# Patient Record
Sex: Female | Born: 1975 | Race: White | Hispanic: No | Marital: Married | Smoking: Former smoker
Health system: Southern US, Community
[De-identification: ages and names within clinical notes are randomized; demographics above are authoritative.]

## PROBLEM LIST (undated history)

## (undated) DIAGNOSIS — T8859XA Other complications of anesthesia, initial encounter: Secondary | ICD-10-CM

## (undated) DIAGNOSIS — R112 Nausea with vomiting, unspecified: Secondary | ICD-10-CM

## (undated) DIAGNOSIS — G709 Myoneural disorder, unspecified: Secondary | ICD-10-CM

## (undated) DIAGNOSIS — M436 Torticollis: Secondary | ICD-10-CM

## (undated) DIAGNOSIS — Z8719 Personal history of other diseases of the digestive system: Secondary | ICD-10-CM

## (undated) DIAGNOSIS — T4145XA Adverse effect of unspecified anesthetic, initial encounter: Secondary | ICD-10-CM

## (undated) DIAGNOSIS — Z9889 Other specified postprocedural states: Secondary | ICD-10-CM

## (undated) DIAGNOSIS — R519 Headache, unspecified: Secondary | ICD-10-CM

## (undated) DIAGNOSIS — E079 Disorder of thyroid, unspecified: Secondary | ICD-10-CM

## (undated) DIAGNOSIS — R51 Headache: Secondary | ICD-10-CM

## (undated) DIAGNOSIS — F419 Anxiety disorder, unspecified: Secondary | ICD-10-CM

## (undated) DIAGNOSIS — I10 Essential (primary) hypertension: Secondary | ICD-10-CM

## (undated) DIAGNOSIS — K219 Gastro-esophageal reflux disease without esophagitis: Secondary | ICD-10-CM

## (undated) DIAGNOSIS — T884XXA Failed or difficult intubation, initial encounter: Secondary | ICD-10-CM

## (undated) DIAGNOSIS — R011 Cardiac murmur, unspecified: Secondary | ICD-10-CM

## (undated) HISTORY — DX: Cardiac murmur, unspecified: R01.1

## (undated) HISTORY — DX: Anxiety disorder, unspecified: F41.9

## (undated) HISTORY — PX: OOPHORECTOMY: SHX86

## (undated) HISTORY — PX: TUBAL LIGATION: SHX77

## (undated) HISTORY — PX: NECK SURGERY: SHX720

## (undated) HISTORY — PX: SPINE SURGERY: SHX786

## (undated) HISTORY — PX: THYROIDECTOMY: SHX17

## (undated) HISTORY — DX: Disorder of thyroid, unspecified: E07.9

## (undated) HISTORY — PX: ABDOMINAL HYSTERECTOMY: SHX81

## (undated) HISTORY — DX: Myoneural disorder, unspecified: G70.9

## (undated) HISTORY — DX: Gastro-esophageal reflux disease without esophagitis: K21.9

---

## 1997-11-29 ENCOUNTER — Emergency Department (HOSPITAL_COMMUNITY): Admission: EM | Admit: 1997-11-29 | Discharge: 1997-11-30 | Payer: Self-pay | Admitting: Emergency Medicine

## 1999-08-04 ENCOUNTER — Emergency Department (HOSPITAL_COMMUNITY): Admission: EM | Admit: 1999-08-04 | Discharge: 1999-08-04 | Payer: Self-pay | Admitting: Emergency Medicine

## 1999-11-26 ENCOUNTER — Emergency Department (HOSPITAL_COMMUNITY): Admission: EM | Admit: 1999-11-26 | Discharge: 1999-11-26 | Payer: Self-pay | Admitting: Emergency Medicine

## 1999-11-26 ENCOUNTER — Encounter: Payer: Self-pay | Admitting: Emergency Medicine

## 2000-01-10 ENCOUNTER — Emergency Department (HOSPITAL_COMMUNITY): Admission: EM | Admit: 2000-01-10 | Discharge: 2000-01-10 | Payer: Self-pay | Admitting: Emergency Medicine

## 2000-01-28 ENCOUNTER — Other Ambulatory Visit: Admission: RE | Admit: 2000-01-28 | Discharge: 2000-01-28 | Payer: Self-pay | Admitting: Obstetrics and Gynecology

## 2016-10-15 ENCOUNTER — Emergency Department: Payer: BLUE CROSS/BLUE SHIELD

## 2016-10-15 ENCOUNTER — Emergency Department
Admission: EM | Admit: 2016-10-15 | Discharge: 2016-10-15 | Disposition: A | Discharge status: Home / Self Care | Payer: BLUE CROSS/BLUE SHIELD | Attending: Emergency Medicine | Admitting: Emergency Medicine

## 2016-10-15 ENCOUNTER — Encounter: Payer: Self-pay | Admitting: Emergency Medicine

## 2016-10-15 DIAGNOSIS — Z87891 Personal history of nicotine dependence: Secondary | ICD-10-CM | POA: Insufficient documentation

## 2016-10-15 DIAGNOSIS — S161XXA Strain of muscle, fascia and tendon at neck level, initial encounter: Secondary | ICD-10-CM | POA: Diagnosis not present

## 2016-10-15 DIAGNOSIS — M542 Cervicalgia: Secondary | ICD-10-CM | POA: Diagnosis not present

## 2016-10-15 DIAGNOSIS — I10 Essential (primary) hypertension: Secondary | ICD-10-CM | POA: Diagnosis not present

## 2016-10-15 DIAGNOSIS — Y939 Activity, unspecified: Secondary | ICD-10-CM | POA: Insufficient documentation

## 2016-10-15 DIAGNOSIS — S199XXA Unspecified injury of neck, initial encounter: Secondary | ICD-10-CM | POA: Diagnosis present

## 2016-10-15 DIAGNOSIS — R21 Rash and other nonspecific skin eruption: Secondary | ICD-10-CM | POA: Diagnosis not present

## 2016-10-15 DIAGNOSIS — Y929 Unspecified place or not applicable: Secondary | ICD-10-CM | POA: Diagnosis not present

## 2016-10-15 DIAGNOSIS — X509XXA Other and unspecified overexertion or strenuous movements or postures, initial encounter: Secondary | ICD-10-CM | POA: Diagnosis not present

## 2016-10-15 DIAGNOSIS — Y999 Unspecified external cause status: Secondary | ICD-10-CM | POA: Insufficient documentation

## 2016-10-15 MED ORDER — CYCLOBENZAPRINE HCL 5 MG PO TABS: 5.0000 mg | ORAL_TABLET | Freq: Three times a day (TID) | ORAL | 0 refills | Status: AC | PRN

## 2016-10-15 MED ORDER — KETOROLAC TROMETHAMINE 30 MG/ML IJ SOLN
30.0000 mg | Freq: Once | INTRAMUSCULAR | Status: AC
  Administered 2016-10-15: 30 mg via INTRAVENOUS
  Filled 2016-10-15: qty 1

## 2016-10-15 MED ORDER — METHYLPREDNISOLONE SODIUM SUCC 125 MG IJ SOLR
125.0000 mg | Freq: Once | INTRAMUSCULAR | Status: AC
  Administered 2016-10-15: 125 mg via INTRAMUSCULAR

## 2016-10-15 MED ORDER — METHYLPREDNISOLONE SODIUM SUCC 125 MG IJ SOLR
125.0000 mg | Freq: Once | INTRAMUSCULAR | Status: DC
  Filled 2016-10-15: qty 2

## 2016-10-15 MED ORDER — IBUPROFEN 800 MG PO TABS: 800.0000 mg | ORAL_TABLET | Freq: Three times a day (TID) | ORAL | 0 refills | Status: DC | PRN

## 2016-10-15 MED ORDER — PREDNISONE 10 MG PO TABS: ORAL_TABLET | ORAL | 0 refills | Status: DC

## 2016-10-15 NOTE — ED Triage Notes (Addendum)
Pt c/o neck pain to left side.  Heard a pop a couple weeks ago.  Ambulatory to triage.  Worse pain when turning and walking.  Has had neck surgery before.  NAD.  Moving both extremities

## 2016-10-15 NOTE — ED Notes (Signed)
2 weeks ago moved head and felt a pop on left posterior head/neck area.  Since has had headache that is somewhat relived by placing pressure on that spot.  Also she has a rash on legs that came up a few days a go.  Only itches when she is wearing pants.  No fever.

## 2016-10-15 NOTE — ED Notes (Signed)
Returned from xray

## 2016-10-15 NOTE — ED Provider Notes (Signed)
[facility] Emergency Department Provider Note  ____________________________________________  Time seen: Approximately 9:07 AM  I have reviewed the triage vital signs and the nursing notes.   HISTORY  Chief Complaint Neck Injury    HPI Morgan [name] is a 41 y.o. female that presents to the emergency department with right sided neck pain. Patient discribes the pain as a throbbing pressure on the back right of her head. Several weeks ago, patient heard a pop in her neck while having sexual relations and has had pain in her neck since. Movement makes the pain worse. Applying pressure and ice to the area is helpful. She has tried goodypowders and ibuprofen without relief. She feels like this is nerve pain. She has a plate and screws in her neck from her previous surgery. She had decreased motion after her neck surgery, and this has not changed. No tick bites. She also has a rash on her lower leg for 1 week that started after shaving and going into the swimming pool. No fever, SOB, CP, nausea, vomiting, diarrhea, numbness, tingling.  History reviewed. No pertinent past medical history.  There are no active problems to display for this patient.   Past Surgical History:  Procedure Laterality Date  . ABDOMINAL HYSTERECTOMY    . NECK SURGERY    . THYROIDECTOMY      Prior to Admission medications   Medication Sig Start Date End Date Taking? Authorizing Provider  cyclobenzaprine (FLEXERIL) 5 MG tablet Take 1 tablet (5 mg total) by mouth 3 (three) times daily as needed for muscle spasms. 10/15/16 10/22/16  Enid Derry, PA-C  ibuprofen (ADVIL,MOTRIN) 800 MG tablet Take 1 tablet (800 mg total) by mouth every 8 (eight) hours as needed. 10/15/16   Enid Derry, PA-C  predniSONE (DELTASONE) 10 MG tablet Take 6 tablets on day 1, take 5 tablets on day 2, take 4 tablets on day 3, take 3 tablets on day 4, take 2 tablets on day 5, take 1 tablet on day 6 10/15/16   Enid Derry,  PA-C    Allergies Lexapro [escitalopram oxalate] and Topamax [topiramate]  History reviewed. No pertinent family history.  Social History Social History  Substance Use Topics  . Smoking status: Former Games developer  . Smokeless tobacco: Never Used     Comment: quit today  . Alcohol use Yes     Review of Systems  Constitutional: No fever/chills Cardiovascular: No chest pain. Respiratory: No SOB. Gastrointestinal: No abdominal pain.  No nausea, no vomiting.  Musculoskeletal: Positive for neck pain.  Skin: Negative for abrasions, lacerations, ecchymosis. Positive for rash.   Neurological: Negative for numbness or tingling   ____________________________________________   PHYSICAL EXAM:  VITAL SIGNS: ED Triage Vitals  Enc Vitals Group     BP 10/15/16 0757 (!) 179/95     Pulse Rate 10/15/16 0757 100     Resp 10/15/16 0757 18     Temp 10/15/16 0757 98.7 F (37.1 C)     Temp Source 10/15/16 0757 Oral     SpO2 10/15/16 0757 100 %     Weight 10/15/16 0756 150 lb (68 kg)     Height 10/15/16 0756 5\' 6"  (1.676 m)     Head Circumference --      Peak Flow --      Pain Score 10/15/16 0755 6     Pain Loc --      Pain Edu? --      Excl. in GC? --  Constitutional: Alert and oriented. Well appearing and in no acute distress. Eyes: Conjunctivae are normal. PERRL. EOMI. Head: Atraumatic. ENT:      Ears:      Nose: No congestion/rhinnorhea.      Mouth/Throat: Mucous membranes are moist.  Neck: No stridor.  No cervical spine tenderness to palpation. Tenderness to palpation over right trapezius muscle at the base of her head. Pain relieved with deep palpation of right trapezius muscle. Cardiovascular: Normal rate, regular rhythm.  Good peripheral circulation. Respiratory: Normal respiratory effort without tachypnea or retractions. Lungs CTAB. Good air entry to the bases with no decreased or absent breath sounds. Musculoskeletal: Full range of motion to all extremities. No gross  deformities appreciated. Neurologic:  Normal speech and language. No gross focal neurologic deficits are appreciated.  Skin:  Skin is warm, dry and intact. Scattered 1 mm macules and papules over lower bilateral legs. No drainage. Nontender to palpation.   ____________________________________________   LABS (all labs ordered are listed, but only abnormal results are displayed)  Labs Reviewed - No data to display ____________________________________________  EKG   ____________________________________________  RADIOLOGY  No results found.  ____________________________________________    PROCEDURES  Procedure(s) performed:    Procedures    Medications  ketorolac (TORADOL) 30 MG/ML injection 30 mg (30 mg Intravenous Given 10/15/16 1022)  methylPREDNISolone sodium succinate (SOLU-MEDROL) 125 mg/2 mL injection 125 mg (125 mg Intramuscular Given 10/15/16 1030)     ____________________________________________   INITIAL IMPRESSION / ASSESSMENT AND PLAN / ED COURSE  Pertinent labs & imaging results that were available during my care of the patient were reviewed by me and considered in my medical decision making (see chart for details).  Review of the [place] CSRS was performed in accordance of the [facility] prior to dispensing any controlled drugs.   Patient presented to the emergency department with neck pain after injury a couple weeks ago and rash to lower leg after shaving and swimming in a hot tub. Vital signs and exam are reassuring. No indication of acute abnormalities on cervical x-ray. Patient initially presented with high blood pressure but blood pressure recheck was reassuring. Education about high blood pressure was provided. Patient agreed to follow up with primary care provider for evaluation. Patient was given a Medrol and Toradol in ED. Patient will be discharged home with prescriptions for prednisone, Flexeril, ibuprofen. Patient is to follow up with PCP as directed.  Patient is given ED precautions to return to the ED for any worsening or new symptoms.     ____________________________________________  FINAL CLINICAL IMPRESSION(S) / ED DIAGNOSES  Final diagnoses:  Strain of neck muscle, initial encounter  Rash  Hypertension, unspecified type      NEW MEDICATIONS STARTED DURING THIS VISIT:  Discharge Medication List as of 10/15/2016 10:40 AM    START taking these medications   Details  cyclobenzaprine (FLEXERIL) 5 MG tablet Take 1 tablet (5 mg total) by mouth 3 (three) times daily as needed for muscle spasms., Starting Thu 10/15/2016, Until Thu 10/22/2016, Print    ibuprofen (ADVIL,MOTRIN) 800 MG tablet Take 1 tablet (800 mg total) by mouth every 8 (eight) hours as needed., Starting Thu 10/15/2016, Print    predniSONE (DELTASONE) 10 MG tablet Take 6 tablets on day 1, take 5 tablets on day 2, take 4 tablets on day 3, take 3 tablets on day 4, take 2 tablets on day 5, take 1 tablet on day 6, Print  This chart was dictated using voice recognition software/Dragon. Despite best efforts to proofread, errors can occur which can change the meaning. Any change was purely unintentional.    Enid Derry, PA-C 10/16/16 1130    Don Perking, [place], MD 10/16/16 [phone]

## 2016-10-29 ENCOUNTER — Encounter: Payer: Self-pay | Admitting: Internal Medicine

## 2016-10-29 ENCOUNTER — Ambulatory Visit (INDEPENDENT_AMBULATORY_CARE_PROVIDER_SITE_OTHER): Payer: BLUE CROSS/BLUE SHIELD | Admitting: Internal Medicine

## 2016-10-29 VITALS — BP 160/104 | HR 109 | Temp 98.5°F | Ht 65.5 in | Wt 166.0 lb

## 2016-10-29 DIAGNOSIS — I10 Essential (primary) hypertension: Secondary | ICD-10-CM | POA: Diagnosis not present

## 2016-10-29 DIAGNOSIS — S161XXD Strain of muscle, fascia and tendon at neck level, subsequent encounter: Secondary | ICD-10-CM | POA: Diagnosis not present

## 2016-10-29 DIAGNOSIS — K219 Gastro-esophageal reflux disease without esophagitis: Secondary | ICD-10-CM | POA: Diagnosis not present

## 2016-10-29 MED ORDER — BENAZEPRIL-HYDROCHLOROTHIAZIDE 10-12.5 MG PO TABS: 1.0000 | ORAL_TABLET | Freq: Every day | ORAL | 0 refills | Status: DC

## 2016-10-29 MED ORDER — OMEPRAZOLE 20 MG PO CPDR: 20.0000 mg | DELAYED_RELEASE_CAPSULE | Freq: Every day | ORAL | 2 refills | Status: DC

## 2016-10-29 MED ORDER — CYCLOBENZAPRINE HCL 5 MG PO TABS: 5.0000 mg | ORAL_TABLET | Freq: Two times a day (BID) | ORAL | 0 refills | Status: DC | PRN

## 2016-10-29 NOTE — Patient Instructions (Signed)

## 2016-10-29 NOTE — Assessment & Plan Note (Signed)
eRx for Prilosec 20 mg daily Try to avoid foods that improve your reflux.

## 2016-10-29 NOTE — Progress Notes (Signed)
HPI  Pt presents to the clinic today to establish care and for management of the conditions listed below. She is transferring care from [facility].   GERD: Triggered by everything she eats. She had an upper GI many years ago. She takes Zantac OTC with minimal relief.   Elevated Blood Pressure: [facility]. The PA felt like this was pain related. Her BP improved after Toradol and Depo Medrol injections. Her BP today is 160/104. She has never been treated for HTN in the past.  Right Side Neck Pain: She reports about 1 month ago, she sustained a neck injury during sexual intercourse. She went to UC6[facility]28. Xray of cervical spine was negative. She was given Toradol and Depo IM. She was prescribed Prednisone, Flexeril and Ibuprofen. She has had previous anterior fusion os C 5-7. She reports the pain is better but not gone. She would like Flexeril refilled today.  Flu: never Tetanus: < 5 years ago Pap Smear: partial hysterctomy 2012, [facility] Dentist: biannually  Past Medical History:  Diagnosis Date  . GERD (gastroesophageal reflux disease)     Current Outpatient Prescriptions  Medication Sig Dispense Refill  . Aspirin-Salicylamide-Caffeine (BC HEADACHE POWDER PO) Take by mouth.    . cyclobenzaprine (FLEXERIL) 5 MG tablet Take 5 mg by mouth as needed for muscle spasms.    Marland Kitchen. ibuprofen (ADVIL,MOTRIN) 800 MG tablet Take 1 tablet (800 mg total) by mouth every 8 (eight) hours as needed. 30 tablet 0   No current facility-administered medications for this visit.     Allergies  Allergen Reactions  . Lexapro [Escitalopram Oxalate] Other (See Comments)    Loss taste  . Metoprolol Other (See Comments)    Lowers heart rate  . Topamax [Topiramate]     Loss taste    Family History  Problem Relation Age of Onset  . Arthritis Mother   . Arthritis Father   . Stroke Father   . Hypertension Father   . Heart disease Maternal Uncle   . Arthritis  Maternal Grandmother   . Heart disease Maternal Grandmother   . Hypertension Maternal Grandmother   . Arthritis Maternal Grandfather   . Lung cancer Maternal Grandfather   . Heart disease Maternal Grandfather   . Arthritis Paternal Grandmother   . Heart disease Paternal Grandmother   . Hypertension Paternal Grandmother   . Arthritis Paternal Grandfather   . Heart disease Paternal Grandfather   . Stroke Paternal Grandfather     Social History   Social History  . Marital status: Married    Spouse name: N/A  . Number of children: N/A  . Years of education: N/A   Occupational History  . Not on file.   Social History Main Topics  . Smoking status: Former Games developermoker  . Smokeless tobacco: Never Used     Comment: quit today  . Alcohol use Yes     Comment: occasional  . Drug use: No  . Sexual activity: Not on file   Other Topics Concern  . Not on file   Social History Narrative  . No narrative on file    ROS:  Constitutional: Denies fever, malaise, fatigue, headache or abrupt weight changes.  HEENT: Denies eye pain, eye redness, ear pain, ringing in the ears, wax buildup, runny nose, nasal congestion, bloody nose, or sore throat. Respiratory: Denies difficulty breathing, shortness of breath, cough or sputum production.   Cardiovascular: Denies chest pain, chest tightness, palpitations or swelling in  the hands or feet.  Gastrointestinal: Pt reports reflux. Denies abdominal pain, bloating, constipation, diarrhea or blood in the stool.  GU: Denies frequency, urgency, pain with urination, blood in urine, odor or discharge. Musculoskeletal: Pt reports neck pain. Denies decrease in range of motion, difficulty with gait, muscle pain or joint pain and swelling.  Skin: Denies redness, rashes, lesions or ulcercations.  Neurological: Denies dizziness, difficulty with memory, difficulty with speech or problems with balance and coordination.  Psych: Denies anxiety, depression, SI/HI.  No  other specific complaints in a complete review of systems (except as listed in HPI above).  PE:  BP (!) 160/104 (BP Location: Right Arm, Patient Position: Sitting, Cuff Size: Normal)   Pulse (!) 109   Temp 98.5 F (36.9 C) (Oral)   Ht 5' 5.5" (1.664 m)   Wt 166 lb (75.3 kg)   SpO2 98%   BMI 27.20 kg/m   Wt Readings from Last 3 Encounters:  10/29/16 166 lb (75.3 kg)  10/15/16 150 lb (68 kg)    General: Appears her stated age, well developed, well nourished in NAD.  Skin: Dry and intact. Cardiovascular: Normal rate and rhythm. S1,S2 noted.  No murmur, rubs or gallops noted. No JVD or BLE edema.  Pulmonary/Chest: Normal effort and positive vesicular breath sounds. No respiratory distress. No wheezes, rales or ronchi noted.  Abdomen: Soft and nontender. Normal bowel sounds. No distention or masses noted. Musculoskeletal: Normal extension of the cervical spine. Pain with flexion and rotation. No pain with palpation over the bones. Pain with palpation of the right side paracervical muscles.  Neurological: Alert and oriented. Cranial nerves II-XII grossly intact. Coordination normal.  Psychiatric: Mood and affect normal. Behavior is normal. Judgment and thought content normal.     Assessment and Plan:  Muscle Strain of Neck:  Continue Ibuprofen prn Flexeril refilled today Heat may help If worse, consider PT vs MRI  RTC in 3 weeks for follow up HTN Morgan Pilkenton, NP

## 2016-10-29 NOTE — Assessment & Plan Note (Signed)
Start Benazapril HCT  RTC in 3 weeks for follow up HTN

## 2016-11-23 ENCOUNTER — Ambulatory Visit (INDEPENDENT_AMBULATORY_CARE_PROVIDER_SITE_OTHER): Payer: BLUE CROSS/BLUE SHIELD | Admitting: Internal Medicine

## 2016-11-23 ENCOUNTER — Encounter: Payer: Self-pay | Admitting: Internal Medicine

## 2016-11-23 VITALS — BP 120/84 | HR 103 | Temp 98.1°F | Wt 166.5 lb

## 2016-11-23 DIAGNOSIS — I10 Essential (primary) hypertension: Secondary | ICD-10-CM

## 2016-11-23 LAB — TSH: TSH: 1.4 mIU/L

## 2016-11-23 MED ORDER — OMEPRAZOLE 20 MG PO CPDR: 20.0000 mg | DELAYED_RELEASE_CAPSULE | Freq: Every day | ORAL | 3 refills | Status: DC

## 2016-11-23 MED ORDER — OMEPRAZOLE 20 MG PO CPDR: 20.0000 mg | DELAYED_RELEASE_CAPSULE | Freq: Every day | ORAL | 0 refills | Status: DC

## 2016-11-23 MED ORDER — BENAZEPRIL-HYDROCHLOROTHIAZIDE 10-12.5 MG PO TABS: 1.0000 | ORAL_TABLET | Freq: Every day | ORAL | 0 refills | Status: DC

## 2016-11-23 MED ORDER — BENAZEPRIL-HYDROCHLOROTHIAZIDE 10-12.5 MG PO TABS: 1.0000 | ORAL_TABLET | Freq: Every day | ORAL | 3 refills | Status: DC

## 2016-11-23 NOTE — Assessment & Plan Note (Signed)
At goal Benazepril HCT refilled today BMET today Consider low dose beta blocker if she continues to remain tachycardic

## 2016-11-23 NOTE — Progress Notes (Signed)
Subjective:    Patient ID: Morgan [name], female    DOB: 06/29/1975, [age] y.o.   MRN: [id number]  HPI  Pt presents to the clinic today for 3 week follow up of HTN. At her last visit, she was started on Benazepril HCT. She has been taking the medication as prescribed. She denies adverse side effects. Her BP today is 120/84.  Review of Systems      Past Medical History:  Diagnosis Date  . GERD (gastroesophageal reflux disease)     Current Outpatient Prescriptions  Medication Sig Dispense Refill  . Aspirin-Salicylamide-Caffeine (BC HEADACHE POWDER PO) Take by mouth.    . benazepril-hydrochlorthiazide (LOTENSIN HCT) 10-12.5 MG tablet Take 1 tablet by mouth daily. 30 tablet 0  . cyclobenzaprine (FLEXERIL) 5 MG tablet Take 1 tablet (5 mg total) by mouth 2 (two) times daily as needed for muscle spasms. 60 tablet 0  . ibuprofen (ADVIL,MOTRIN) 800 MG tablet Take 1 tablet (800 mg total) by mouth every 8 (eight) hours as needed. 30 tablet 0  . omeprazole (PRILOSEC) 20 MG capsule Take 1 capsule (20 mg total) by mouth daily. 30 capsule 2   No current facility-administered medications for this visit.     Allergies  Allergen Reactions  . Lexapro [Escitalopram Oxalate] Other (See Comments)    Loss taste  . Metoprolol Other (See Comments)    Lowers heart rate  . Topamax [Topiramate]     Loss taste    Family History  Problem Relation Age of Onset  . Arthritis Mother   . Arthritis Father   . Stroke Father   . Hypertension Father   . Heart disease Maternal Uncle   . Arthritis Maternal Grandmother   . Heart disease Maternal Grandmother   . Hypertension Maternal Grandmother   . Arthritis Maternal Grandfather   . Lung cancer Maternal Grandfather   . Heart disease Maternal Grandfather   . Arthritis Paternal Grandmother   . Heart disease Paternal Grandmother   . Hypertension Paternal Grandmother   . Arthritis Paternal Grandfather   . Heart disease Paternal Grandfather   . Stroke  Paternal Grandfather     Social History   Social History  . Marital status: Married    Spouse name: N/A  . Number of children: N/A  . Years of education: N/A   Occupational History  . Not on file.   Social History Main Topics  . Smoking status: Former Games developermoker  . Smokeless tobacco: Never Used     Comment: quit today  . Alcohol use Yes     Comment: occasional  . Drug use: No  . Sexual activity: Yes   Other Topics Concern  . Not on file   Social History Narrative  . No narrative on file     Constitutional: Denies fever, malaise, fatigue, headache or abrupt weight changes.  Respiratory: Denies difficulty breathing, shortness of breath, cough or sputum production.   Cardiovascular: Denies chest pain, chest tightness, palpitations or swelling in the hands or feet.  Neurological: Denies dizziness, difficulty with memory, difficulty with speech or problems with balance and coordination.    No other specific complaints in a complete review of systems (except as listed in HPI above).  Objective:   Physical Exam   BP 120/84   Pulse (!) 103   Temp 98.1 F (36.7 C) (Oral)   Wt 166 lb 8 oz (75.5 kg)   SpO2 97%   BMI 27.29 kg/m  Wt Readings from Last 3 Encounters:  11/23/16  166 lb 8 oz (75.5 kg)  10/29/16 166 lb (75.3 kg)  10/15/16 150 lb (68 kg)    General: Appearsherstated age, well developed, well nourished in NAD. Cardiovascular: Tachycardic with normal rhythm. S1,S2 noted.  No murmur, rubs or gallops noted.  Pulmonary/Chest: Normal effort and positive vesicular breath sounds. No respiratory distress. No wheezes, rales or ronchi noted.  Neurological: Alert and oriented.      Assessment & Plan:

## 2016-11-23 NOTE — Addendum Note (Signed)
Addended by: Gregery NaVALENCIA, Cecelia Graciano P on: 11/23/2016 03:27 PM   Modules accepted: Orders

## 2016-11-23 NOTE — Patient Instructions (Signed)

## 2016-11-24 ENCOUNTER — Telehealth: Payer: Self-pay

## 2016-11-24 LAB — BASIC METABOLIC PANEL
BUN: 14 mg/dL (ref 7–25)
CHLORIDE: 101 mmol/L (ref 98–110)
CO2: 29 mmol/L (ref 20–32)
Calcium: 9.4 mg/dL (ref 8.6–10.2)
Creat: 0.76 mg/dL (ref 0.50–1.10)
GLUCOSE: 97 mg/dL (ref 65–99)
POTASSIUM: 3.9 mmol/L (ref 3.5–5.3)
SODIUM: 143 mmol/L (ref 135–146)

## 2016-11-24 NOTE — Telephone Encounter (Signed)
-----   Message from Randal Bubaose D Brewer sent at 11/24/2016  3:24 AM EDT ----- Sinda DuHi Shakil Dirk,  I have reviewed Ms. Flenner's chart and for DOS 10/29/2016 her insurance was billed $395.  Of that $395 [employer] did a contractural write off of $113.26 which left a balance of $281.74.  Ms. Duke has a deductible so the $281.74 was applied to her deductible for the year.    If you have further questions please feel free to let me know.  Thank you! Rose ----- Message ----- From: Roena Maladyevontenno, Karmina Zufall Y, CMA Sent: 11/23/2016   4:51 PM To: Omar Personose D Brewer, Carnisha Feltz Y Fawn Desrocher, CMA  Good afternoon,  I was wondering if you could look into this bill for pt. Pt was seen for a f/u OV today and stated that she received a bill for almost $500 for her last est care appt. Nicki ReaperRegina Baity stated that it should have been a level 3 as pt did not have labs. Pt was very stressed about the bill. Can you see if it was an error from [place] or billing? Thanks, I greatly appreciate it.  Shawna OrleansMelanie

## 2016-11-26 ENCOUNTER — Telehealth: Payer: Self-pay

## 2016-11-26 NOTE — Telephone Encounter (Signed)
The notes in Epic state that if heart rate continued to remain elevated then she would consider a low dose beta blocker. What's her heart rate running at home? What's her blood pressure? Does she have any palpitations?

## 2016-11-26 NOTE — Telephone Encounter (Addendum)
Pt left v/m; R Baity NP was to call pt a beta blocker to help slow heart rate after lab testing done. Pharmacy has not gotten med yet. Benazepril HCTZ is on med list but that is a calcium channel blocker.pt request cb. Please advise. Pamala Hurry Baity NP is out of office. Pt was seen 11/23/16. [facility].

## 2016-11-26 NOTE — Telephone Encounter (Signed)
We can start a low dose beta blocker but not until I see her back in 2 weeks for her BP check. She needs to continue Benazepril HCT for now.

## 2016-11-26 NOTE — Telephone Encounter (Signed)
Pt reports her heart rate has been between 105 and 118 also BP has been as low as 117/78 and as high as 130/82.... Please advise

## 2016-11-27 NOTE — Telephone Encounter (Signed)
Left message on voicemail.

## 2016-12-07 ENCOUNTER — Telehealth: Payer: Self-pay | Admitting: Internal Medicine

## 2016-12-07 NOTE — Telephone Encounter (Signed)
Pt called to check on bill received from 10/29/16 new pt appt. Pt states she spoke with Shawna Orleans and she was going to see what she could find out. She has called billing and ins and was told it was coding at [facility]. She has not received a revised bill and is requesting a cb to discuss.

## 2016-12-08 ENCOUNTER — Ambulatory Visit: Payer: BLUE CROSS/BLUE SHIELD | Admitting: Internal Medicine

## 2017-01-05 DIAGNOSIS — K1321 Leukoplakia of oral mucosa, including tongue: Secondary | ICD-10-CM | POA: Diagnosis not present

## 2017-07-29 DIAGNOSIS — Z01419 Encounter for gynecological examination (general) (routine) without abnormal findings: Secondary | ICD-10-CM | POA: Diagnosis not present

## 2017-07-29 DIAGNOSIS — R87611 Atypical squamous cells cannot exclude high grade squamous intraepithelial lesion on cytologic smear of cervix (ASC-H): Secondary | ICD-10-CM | POA: Diagnosis not present

## 2017-07-29 DIAGNOSIS — Z1231 Encounter for screening mammogram for malignant neoplasm of breast: Secondary | ICD-10-CM | POA: Diagnosis not present

## 2017-08-09 DIAGNOSIS — N72 Inflammatory disease of cervix uteri: Secondary | ICD-10-CM | POA: Diagnosis not present

## 2017-08-09 DIAGNOSIS — N898 Other specified noninflammatory disorders of vagina: Secondary | ICD-10-CM | POA: Diagnosis not present

## 2017-08-09 DIAGNOSIS — N871 Moderate cervical dysplasia: Secondary | ICD-10-CM | POA: Diagnosis not present

## 2017-08-09 DIAGNOSIS — B977 Papillomavirus as the cause of diseases classified elsewhere: Secondary | ICD-10-CM | POA: Diagnosis not present

## 2017-08-09 DIAGNOSIS — R87611 Atypical squamous cells cannot exclude high grade squamous intraepithelial lesion on cytologic smear of cervix (ASC-H): Secondary | ICD-10-CM | POA: Diagnosis not present

## 2017-08-09 DIAGNOSIS — N760 Acute vaginitis: Secondary | ICD-10-CM | POA: Diagnosis not present

## 2017-08-24 DIAGNOSIS — N8111 Cystocele, midline: Secondary | ICD-10-CM | POA: Diagnosis not present

## 2017-08-24 DIAGNOSIS — N3281 Overactive bladder: Secondary | ICD-10-CM | POA: Diagnosis not present

## 2017-08-24 DIAGNOSIS — N871 Moderate cervical dysplasia: Secondary | ICD-10-CM | POA: Diagnosis not present

## 2017-09-28 NOTE — H&P (Signed)
[name] is a 42 y.o. female here for Discuss surgery .  Pt with CXBX showing CIN2 . Pt is s/p a LSH for bleeding in the past  . She was told she has prolapse of vaginal tissues . Also with urinary frequency and has nocturia 2-3 x/ night SVD x2  Past Medical History:  has a past medical history of Hypertension and Thyroid disease.  Past Surgical History:  has a past surgical history that includes Lobectomy Partial Thyroid (2009); Discectomy Anterior Cervicle W/Decomp (2012); Tubal ligation; Hysterectomy (2010); and Colposcopy (08/09/2017). Family History: family history includes High blood pressure (Hypertension) in her maternal grandfather, maternal grandmother, maternal uncle, mother, and paternal grandmother. Social History:  reports that she quit smoking about 2 years ago. Her smoking use included cigarettes. She started smoking about 25 years ago. She has never used smokeless tobacco. She reports that she drinks alcohol. She reports that she does not use drugs. OB/GYN History:          OB History    Gravida  3   Para  2   Term  2   Preterm      AB  1   Living  2     SAB  1   TAB      Ectopic      Molar      Multiple      Live Births  2          Allergies: is allergic to adhesive; latex; lexapro [escitalopram oxalate]; and topamax [topiramate]. Medications:  Current Outpatient Medications:  .  benazepril-hydrochlorthiazide (LOTENSIN HCT) 10-12.5 mg tablet, Take by mouth, Disp: , Rfl:  .  cyclobenzaprine (FLEXERIL) 5 MG tablet, TK 1 T PO TID PRF MSP, Disp: , Rfl: 0 .  omeprazole (PRILOSEC) 20 MG DR capsule, , Disp: , Rfl:   Review of Systems: General:                      No fatigue or weight loss Eyes:                           No vision changes Ears:                            No hearing difficulty Respiratory:                No cough or shortness of breath Pulmonary:                  No asthma or shortness of breath Cardiovascular:            No chest pain, palpitations, dyspnea on exertion Gastrointestinal:          No abdominal bloating, chronic diarrhea, constipations, masses, pain or hematochezia Genitourinary:             No hematuria, dysuria, abnormal vaginal discharge, pelvic pain, Menometrorrhagia, + urinary frequency and nocturia  Lymphatic:                   No swollen lymph nodes Musculoskeletal:         No muscle weakness Neurologic:                  No extremity weakness, syncope, seizure disorder Psychiatric:                  No history  of depression, delusions or suicidal/homicidal ideation    Exam:      Vitals:   08/24/17 1627  BP: 107/77  Pulse: 98    Body mass index is 29.7 kg/m.  WDWN white/ female in NAD   Lungs: CTA  CV : RRR without murmur   Neck:  no thyromegaly Abdomen: soft , no mass, normal active bowel sounds,  non-tender, no rebound tenderness Pelvic: tanner stage 5 ,  External genitalia: vulva /labia no lesions Urethra: no prolapse Vagina: normal physiologic d/c, grade 1 cytocele with valsalva , no rectocele  Cervix: no lesions, no cervical motion tenderness   Uterus: absentAdnexa: absent  Impression:   The primary encounter diagnosis was OAB (overactive bladder). Diagnoses of Moderate cervical dysplasia and Cystocele, midline were also pertinent to this visit.   Cystocele not clinically significant  Plan:  Offered cx leep  Vs more definitive surgery . She has elected for l/s and cervical trachelectomy   Add vesicare 10 mg daily for OAB    Return if symptoms worsen or fail to improve, for preop.  Vilma PraderHOMAS JANSE Vera Furniss, MD       Electronically signed by Vilma PraderSchermerhorn, Ameera Tigue Janse, MD on 08/24/2017 5:14 PM

## 2017-09-30 ENCOUNTER — Encounter
Admission: RE | Admit: 2017-09-30 | Discharge: 2017-09-30 | Disposition: A | Discharge status: Home / Self Care | Payer: BLUE CROSS/BLUE SHIELD | Source: Ambulatory Visit | Attending: Obstetrics and Gynecology | Admitting: Obstetrics and Gynecology

## 2017-09-30 ENCOUNTER — Other Ambulatory Visit: Payer: Self-pay

## 2017-09-30 DIAGNOSIS — Z01818 Encounter for other preprocedural examination: Secondary | ICD-10-CM | POA: Insufficient documentation

## 2017-09-30 DIAGNOSIS — I10 Essential (primary) hypertension: Secondary | ICD-10-CM | POA: Diagnosis not present

## 2017-09-30 HISTORY — DX: Adverse effect of unspecified anesthetic, initial encounter: T41.45XA

## 2017-09-30 HISTORY — DX: Headache, unspecified: R51.9

## 2017-09-30 HISTORY — DX: Other specified postprocedural states: Z98.890

## 2017-09-30 HISTORY — DX: Personal history of other diseases of the digestive system: Z87.19

## 2017-09-30 HISTORY — DX: Other specified postprocedural states: R11.2

## 2017-09-30 HISTORY — DX: Essential (primary) hypertension: I10

## 2017-09-30 HISTORY — DX: Other complications of anesthesia, initial encounter: T88.59XA

## 2017-09-30 HISTORY — DX: Headache: R51

## 2017-09-30 LAB — BASIC METABOLIC PANEL
ANION GAP: 13 (ref 5–15)
BUN: 12 mg/dL (ref 6–20)
CHLORIDE: 101 mmol/L (ref 101–111)
CO2: 26 mmol/L (ref 22–32)
Calcium: 9.7 mg/dL (ref 8.9–10.3)
Creatinine, Ser: 0.74 mg/dL (ref 0.44–1.00)
GFR calc Af Amer: >60 mL/min (ref >60)
GFR calc non Af Amer: >60 mL/min (ref >60)
GLUCOSE: 93 mg/dL (ref 65–99)
POTASSIUM: 3.6 mmol/L (ref 3.5–5.1)
Sodium: 140 mmol/L (ref 135–145)

## 2017-09-30 LAB — TYPE AND SCREEN
ABO/RH(D): O POS
Antibody Screen: NEGATIVE

## 2017-09-30 LAB — CBC
HEMATOCRIT: 40.4 % (ref 35.0–47.0)
HEMOGLOBIN: 13.6 g/dL (ref 12.0–16.0)
MCH: 27.1 pg (ref 26.0–34.0)
MCHC: 33.7 g/dL (ref 32.0–36.0)
MCV: 80.4 fL (ref 80.0–100.0)
Platelets: 319 10*3/uL (ref 150–440)
RBC: 5.02 MIL/uL (ref 3.80–5.20)
RDW: 15.2 % — AB (ref 11.5–14.5)
WBC: 9.1 10*3/uL (ref 3.6–11.0)

## 2017-09-30 MED ORDER — FLEET ENEMA 7-19 GM/118ML RE ENEM
1.0000 | ENEMA | Freq: Once | RECTAL | Status: DC
  Filled 2017-09-30: qty 1

## 2017-09-30 NOTE — Patient Instructions (Signed)
Your procedure is scheduled oN 10/15/17 Report to Day Surgery. [facility] SECOND FLOOR To find out your arrival time please call [phone] between 1PM - 3PM on 10/14/17  Remember: Instructions that are not followed completely may result in serious medical risk, up to and including death, or upon the discretion of your surgeon and anesthesiologist your surgery may need to be rescheduled.     _X__ 1. Do not eat food after midnight the night before your procedure.                 No gum chewing or hard candies. You may drink clear liquids up to 2 hours                 before you are scheduled to arrive for your surgery- DO not drink clear                 liquids within 2 hours of the start of your surgery.                 Clear Liquids include:  water, apple juice without pulp, clear carbohydrate                 drink such as Clearfast of Gartorade, Black Coffee or Tea (Do not add                 anything to coffee or tea).  __X__2.  On the morning of surgery brush your teeth with toothpaste and water, you                 may rinse your mouth with mouthwash if you wish.  Do not swallow any              toothpaste of mouthwash.     _X__ 3.  No Alcohol for 24 hours before or after surgery.   _X__ 4.  Do Not Smoke or use e-cigarettes For 24 Hours Prior to Your Surgery.                 Do not use any chewable tobacco products for at least 6 hours prior to                 surgery.  ____  5.  Bring all medications with you on the day of surgery if instructed.   _X___  6.  Notify your doctor if there is any change in your medical condition      (cold, fever, infections).     Do not wear jewelry, make-up, hairpins, clips or nail polish. Do not wear lotions, powders, or perfumes. You may wear deodorant. Do not shave 48 hours prior to surgery. Men may shave face and neck. Do not bring valuables to the hospital.    [facility] is not responsible for any belongings or  valuables.  Contacts, dentures or bridgework may not be worn into surgery. Leave your suitcase in the car. After surgery it may be brought to your room. For patients admitted to the hospital, discharge time is determined by your treatment team.   Patients discharged the day of surgery will not be allowed to drive home.   Please read over the following fact sheets that you were given:   Surgical Site Infection Prevention / SPIROMETRY  X__ Take these medicines the morning of surgery with A SIP OF WATER:    1.OMEPRAZOLE AT BEDTIME 10/14/17 AND AM SURGERY  2.   3.   4.  5.  6.  X_ Fleet Enema (as directed)   1 HOUR BEFORE COMING DAY OF SURGERY __X__ Use CHG Soap as directed  ____ Use inhalers on the day of surgery  ____ Stop metformin 2 days prior to surgery    ____ Take 1/2 of usual insulin dose the night before surgery. No insulin the morning          of surgery.   ____ Stop Coumadin/Plavix/aspirin on  _X___ Stop Anti-inflammatories on    ON 10/07/17   __X__ Stop supplements until after surgery.   STOP HYDROXYCUT ON 10/07/17  ____ Bring C-Pap to the hospital.

## 2017-10-15 ENCOUNTER — Encounter
Admission: RE | Disposition: A | Discharge status: Home / Self Care | Payer: Self-pay | Source: Ambulatory Visit | Attending: Obstetrics and Gynecology

## 2017-10-15 ENCOUNTER — Ambulatory Visit
Admission: RE | Admit: 2017-10-15 | Discharge: 2017-10-15 | Disposition: A | Discharge status: Home / Self Care | Payer: BLUE CROSS/BLUE SHIELD | Source: Ambulatory Visit | Attending: Obstetrics and Gynecology | Admitting: Obstetrics and Gynecology

## 2017-10-15 ENCOUNTER — Ambulatory Visit: Payer: BLUE CROSS/BLUE SHIELD | Admitting: Certified Registered"

## 2017-10-15 ENCOUNTER — Other Ambulatory Visit: Payer: Self-pay

## 2017-10-15 ENCOUNTER — Encounter: Payer: Self-pay | Admitting: *Deleted

## 2017-10-15 DIAGNOSIS — N870 Mild cervical dysplasia: Secondary | ICD-10-CM | POA: Insufficient documentation

## 2017-10-15 DIAGNOSIS — K660 Peritoneal adhesions (postprocedural) (postinfection): Secondary | ICD-10-CM | POA: Insufficient documentation

## 2017-10-15 DIAGNOSIS — Z888 Allergy status to other drugs, medicaments and biological substances status: Secondary | ICD-10-CM | POA: Diagnosis not present

## 2017-10-15 DIAGNOSIS — Z87891 Personal history of nicotine dependence: Secondary | ICD-10-CM | POA: Insufficient documentation

## 2017-10-15 DIAGNOSIS — Z90711 Acquired absence of uterus with remaining cervical stump: Secondary | ICD-10-CM | POA: Insufficient documentation

## 2017-10-15 DIAGNOSIS — E079 Disorder of thyroid, unspecified: Secondary | ICD-10-CM | POA: Diagnosis not present

## 2017-10-15 DIAGNOSIS — Z8249 Family history of ischemic heart disease and other diseases of the circulatory system: Secondary | ICD-10-CM | POA: Diagnosis not present

## 2017-10-15 DIAGNOSIS — Z91040 Latex allergy status: Secondary | ICD-10-CM | POA: Diagnosis not present

## 2017-10-15 DIAGNOSIS — N871 Moderate cervical dysplasia: Secondary | ICD-10-CM | POA: Insufficient documentation

## 2017-10-15 DIAGNOSIS — R350 Frequency of micturition: Secondary | ICD-10-CM | POA: Diagnosis not present

## 2017-10-15 DIAGNOSIS — I10 Essential (primary) hypertension: Secondary | ICD-10-CM | POA: Insufficient documentation

## 2017-10-15 DIAGNOSIS — K449 Diaphragmatic hernia without obstruction or gangrene: Secondary | ICD-10-CM | POA: Diagnosis not present

## 2017-10-15 DIAGNOSIS — N736 Female pelvic peritoneal adhesions (postinfective): Secondary | ICD-10-CM | POA: Diagnosis not present

## 2017-10-15 DIAGNOSIS — N879 Dysplasia of cervix uteri, unspecified: Secondary | ICD-10-CM | POA: Diagnosis not present

## 2017-10-15 DIAGNOSIS — R351 Nocturia: Secondary | ICD-10-CM | POA: Insufficient documentation

## 2017-10-15 DIAGNOSIS — Z79899 Other long term (current) drug therapy: Secondary | ICD-10-CM | POA: Diagnosis not present

## 2017-10-15 DIAGNOSIS — Z91048 Other nonmedicinal substance allergy status: Secondary | ICD-10-CM | POA: Insufficient documentation

## 2017-10-15 HISTORY — PX: TRACHELECTOMY: SHX6586

## 2017-10-15 HISTORY — DX: Failed or difficult intubation, initial encounter: T88.4XXA

## 2017-10-15 LAB — TYPE AND SCREEN
ABO/RH(D): O POS
ANTIBODY SCREEN: NEGATIVE

## 2017-10-15 SURGERY — TRACHELECTOMY
Anesthesia: General

## 2017-10-15 MED ORDER — FENTANYL CITRATE (PF) 100 MCG/2ML IJ SOLN: 25.0000 ug | INTRAMUSCULAR | Status: DC | PRN

## 2017-10-15 MED ORDER — MIDAZOLAM HCL 5 MG/5ML IJ SOLN
INTRAMUSCULAR | Status: AC
  Filled 2017-10-15: qty 5

## 2017-10-15 MED ORDER — EPHEDRINE SULFATE 50 MG/ML IJ SOLN
INTRAMUSCULAR | Status: DC | PRN
  Administered 2017-10-15: 10 mg via INTRAVENOUS

## 2017-10-15 MED ORDER — ONDANSETRON HCL 4 MG/2ML IJ SOLN
INTRAMUSCULAR | Status: AC
  Filled 2017-10-15: qty 2

## 2017-10-15 MED ORDER — SUGAMMADEX SODIUM 200 MG/2ML IV SOLN
INTRAVENOUS | Status: DC | PRN
  Administered 2017-10-15: 200 mg via INTRAVENOUS

## 2017-10-15 MED ORDER — BUPIVACAINE HCL (PF) 0.5 % IJ SOLN
INTRAMUSCULAR | Status: DC | PRN
  Administered 2017-10-15: 12 mL

## 2017-10-15 MED ORDER — ONDANSETRON HCL 4 MG/2ML IJ SOLN: 4.0000 mg | Freq: Once | INTRAMUSCULAR | Status: DC | PRN

## 2017-10-15 MED ORDER — SCOPOLAMINE 1 MG/3DAYS TD PT72
1.0000 | MEDICATED_PATCH | Freq: Once | TRANSDERMAL | Status: DC
  Administered 2017-10-15: 1.5 mg via TRANSDERMAL

## 2017-10-15 MED ORDER — MIDAZOLAM HCL 2 MG/2ML IJ SOLN
INTRAMUSCULAR | Status: DC | PRN
  Administered 2017-10-15: 5 mg via INTRAVENOUS

## 2017-10-15 MED ORDER — LIDOCAINE HCL (PF) 2 % IJ SOLN
INTRAMUSCULAR | Status: AC
  Filled 2017-10-15: qty 10

## 2017-10-15 MED ORDER — LACTATED RINGERS IV SOLN: INTRAVENOUS | Status: DC

## 2017-10-15 MED ORDER — ONDANSETRON HCL 4 MG/2ML IJ SOLN
INTRAMUSCULAR | Status: DC | PRN
  Administered 2017-10-15 (×2): 4 mg via INTRAVENOUS

## 2017-10-15 MED ORDER — PHENYLEPHRINE HCL 10 MG/ML IJ SOLN
INTRAMUSCULAR | Status: AC
  Filled 2017-10-15: qty 1

## 2017-10-15 MED ORDER — GLYCOPYRROLATE 0.2 MG/ML IJ SOLN
INTRAMUSCULAR | Status: DC | PRN
  Administered 2017-10-15: 0.2 mg via INTRAVENOUS

## 2017-10-15 MED ORDER — SUGAMMADEX SODIUM 200 MG/2ML IV SOLN
INTRAVENOUS | Status: AC
  Filled 2017-10-15: qty 2

## 2017-10-15 MED ORDER — SCOPOLAMINE 1 MG/3DAYS TD PT72
MEDICATED_PATCH | TRANSDERMAL | Status: AC
  Administered 2017-10-15: 1.5 mg via TRANSDERMAL
  Filled 2017-10-15: qty 1

## 2017-10-15 MED ORDER — GLYCOPYRROLATE 0.2 MG/ML IJ SOLN
INTRAMUSCULAR | Status: AC
  Filled 2017-10-15: qty 1

## 2017-10-15 MED ORDER — CEFAZOLIN SODIUM-DEXTROSE 2-4 GM/100ML-% IV SOLN
INTRAVENOUS | Status: AC
  Filled 2017-10-15: qty 100

## 2017-10-15 MED ORDER — PROMETHAZINE HCL 25 MG/ML IJ SOLN
INTRAMUSCULAR | Status: DC | PRN
  Administered 2017-10-15: 12.5 mg via INTRAVENOUS

## 2017-10-15 MED ORDER — PROPOFOL 10 MG/ML IV BOLUS
INTRAVENOUS | Status: DC | PRN
  Administered 2017-10-15: 150 mg via INTRAVENOUS
  Administered 2017-10-15: 50 mg via INTRAVENOUS

## 2017-10-15 MED ORDER — ACETAMINOPHEN NICU IV SYRINGE 10 MG/ML
INTRAVENOUS | Status: AC
  Filled 2017-10-15: qty 1

## 2017-10-15 MED ORDER — LACTATED RINGERS IV SOLN
INTRAVENOUS | Status: DC
  Administered 2017-10-15 (×2): via INTRAVENOUS

## 2017-10-15 MED ORDER — ACETAMINOPHEN 10 MG/ML IV SOLN
INTRAVENOUS | Status: DC | PRN
  Administered 2017-10-15: 1000 mg via INTRAVENOUS

## 2017-10-15 MED ORDER — DEXAMETHASONE SODIUM PHOSPHATE 10 MG/ML IJ SOLN
INTRAMUSCULAR | Status: AC
  Filled 2017-10-15: qty 1

## 2017-10-15 MED ORDER — OXYCODONE-ACETAMINOPHEN 5-325 MG PO TABS: 1.0000 | ORAL_TABLET | Freq: Once | ORAL | Status: DC

## 2017-10-15 MED ORDER — HYDROMORPHONE HCL 1 MG/ML IJ SOLN
INTRAMUSCULAR | Status: DC | PRN
  Administered 2017-10-15: 2 mg via INTRAVENOUS

## 2017-10-15 MED ORDER — LIDOCAINE-EPINEPHRINE 1 %-1:100000 IJ SOLN
INTRAMUSCULAR | Status: DC | PRN
  Administered 2017-10-15: 10 mL

## 2017-10-15 MED ORDER — PHENYLEPHRINE HCL 10 MG/ML IJ SOLN
INTRAMUSCULAR | Status: DC | PRN
  Administered 2017-10-15 (×2): 100 ug via INTRAVENOUS
  Administered 2017-10-15 (×4): 200 ug via INTRAVENOUS
  Administered 2017-10-15 (×2): 100 ug via INTRAVENOUS

## 2017-10-15 MED ORDER — KETOROLAC TROMETHAMINE 30 MG/ML IJ SOLN
INTRAMUSCULAR | Status: DC | PRN
  Administered 2017-10-15: 30 mg via INTRAVENOUS

## 2017-10-15 MED ORDER — ROCURONIUM BROMIDE 50 MG/5ML IV SOLN
INTRAVENOUS | Status: AC
  Filled 2017-10-15: qty 1

## 2017-10-15 MED ORDER — KETAMINE HCL 10 MG/ML IJ SOLN
INTRAMUSCULAR | Status: DC | PRN
  Administered 2017-10-15: 30 mg via INTRAVENOUS
  Administered 2017-10-15: 20 mg via INTRAVENOUS

## 2017-10-15 MED ORDER — DEXAMETHASONE SODIUM PHOSPHATE 10 MG/ML IJ SOLN
INTRAMUSCULAR | Status: DC | PRN
  Administered 2017-10-15: 10 mg via INTRAVENOUS

## 2017-10-15 MED ORDER — PROMETHAZINE HCL 25 MG/ML IJ SOLN
INTRAMUSCULAR | Status: AC
  Filled 2017-10-15: qty 1

## 2017-10-15 MED ORDER — LIDOCAINE HCL (CARDIAC) PF 100 MG/5ML IV SOSY
PREFILLED_SYRINGE | INTRAVENOUS | Status: DC | PRN
  Administered 2017-10-15: 100 mg via INTRAVENOUS

## 2017-10-15 MED ORDER — ROCURONIUM BROMIDE 100 MG/10ML IV SOLN
INTRAVENOUS | Status: DC | PRN
  Administered 2017-10-15: 50 mg via INTRAVENOUS

## 2017-10-15 MED ORDER — HYDROMORPHONE HCL 1 MG/ML IJ SOLN
INTRAMUSCULAR | Status: AC
Start: 2017-10-15 — End: ?
  Filled 2017-10-15: qty 2

## 2017-10-15 MED ORDER — CEFAZOLIN SODIUM-DEXTROSE 2-4 GM/100ML-% IV SOLN
2.0000 g | Freq: Once | INTRAVENOUS | Status: AC
  Administered 2017-10-15: 2 g via INTRAVENOUS

## 2017-10-15 MED ORDER — PROPOFOL 10 MG/ML IV BOLUS
INTRAVENOUS | Status: AC
  Filled 2017-10-15: qty 20

## 2017-10-15 MED ORDER — EPHEDRINE SULFATE 50 MG/ML IJ SOLN
INTRAMUSCULAR | Status: AC
  Filled 2017-10-15: qty 1

## 2017-10-15 SURGICAL SUPPLY — 38 items
BAG URINE DRAINAGE (UROLOGICAL SUPPLIES) ×2 IMPLANT
BLADE SURG SZ10 CARB STEEL (BLADE) ×2 IMPLANT
BNDG GAUZE 4.5X4.1 6PLY STRL (MISCELLANEOUS) ×2 IMPLANT
CATH FOLEY 2WAY  5CC 16FR (CATHETERS) ×1
CATH URTH 16FR FL 2W BLN LF (CATHETERS) ×1 IMPLANT
DRAPE PERI LITHO V/GYN (MISCELLANEOUS) ×2 IMPLANT
DRAPE SHEET LG 3/4 BI-LAMINATE (DRAPES) ×2 IMPLANT
DRAPE SURG 17X11 SM STRL (DRAPES) ×2 IMPLANT
DRAPE UNDER BUTTOCK W/FLU (DRAPES) ×2 IMPLANT
ELECT REM PT RETURN 9FT ADLT (ELECTROSURGICAL) ×2
ELECTRODE REM PT RTRN 9FT ADLT (ELECTROSURGICAL) ×1 IMPLANT
GLOVE BIO SURGEON STRL SZ8 (GLOVE) ×8 IMPLANT
GOWN STRL REUS W/ TWL LRG LVL3 (GOWN DISPOSABLE) ×3 IMPLANT
GOWN STRL REUS W/ TWL XL LVL3 (GOWN DISPOSABLE) ×1 IMPLANT
GOWN STRL REUS W/TWL LRG LVL3 (GOWN DISPOSABLE) ×3
GOWN STRL REUS W/TWL XL LVL3 (GOWN DISPOSABLE) ×1
KIT PINK PAD W/HEAD ARE REST (MISCELLANEOUS) ×2
KIT PINK PAD W/HEAD ARM REST (MISCELLANEOUS) ×1 IMPLANT
KIT TURNOVER CYSTO (KITS) ×2 IMPLANT
NDL SAFETY ECLIPSE 18X1.5 (NEEDLE) ×1 IMPLANT
NEEDLE HYPO 18GX1.5 SHARP (NEEDLE) ×1
NEEDLE HYPO 22GX1.5 SAFETY (NEEDLE) ×2 IMPLANT
NS IRRIG 500ML POUR BTL (IV SOLUTION) ×2 IMPLANT
PACK BASIN MINOR ARMC (MISCELLANEOUS) ×2 IMPLANT
PACK GYN LAPAROSCOPIC (MISCELLANEOUS) ×2 IMPLANT
PAD OB MATERNITY 4.3X12.25 (PERSONAL CARE ITEMS) ×2 IMPLANT
PAD PREP 24X41 OB/GYN DISP (PERSONAL CARE ITEMS) ×2 IMPLANT
SHEARS HARMONIC ACE PLUS 36CM (ENDOMECHANICALS) ×2 IMPLANT
SLEEVE ENDOPATH XCEL 5M (ENDOMECHANICALS) ×4 IMPLANT
SPONGE XRAY 4X4 16PLY STRL (MISCELLANEOUS) ×2 IMPLANT
SUT VIC AB 0 CT1 27 (SUTURE) ×2
SUT VIC AB 0 CT1 27XCR 8 STRN (SUTURE) ×2 IMPLANT
SUT VIC AB 0 CT1 36 (SUTURE) ×2 IMPLANT
SYR 10ML LL (SYRINGE) ×2 IMPLANT
SYR 30ML LL (SYRINGE) ×2 IMPLANT
SYR CONTROL 10ML (SYRINGE) ×2 IMPLANT
TROCAR XCEL NON-BLD 5MMX100MML (ENDOMECHANICALS) ×2 IMPLANT
TUBING INSUF HEATED (TUBING) ×2 IMPLANT

## 2017-10-15 NOTE — Op Note (Signed)
NAMEHOLLEIGH, CRIHFIELD MEDICAL RECORD ZO:[id number] ACCOUNT [id number] DATE OF BIRTH:February 21, 1976 FACILITY: [facility] LOCATION: [facility] PHYSICIAN:Rhiana Morash Cloyde Reams, MD  OPERATIVE REPORT  DATE OF PROCEDURE:  10/15/2017  PREOPERATIVE DIAGNOSIS:  Cervical dysplasia, status post laparoscopic supracervical hysterectomy.  POSTOPERATIVE DIAGNOSIS:  Cervical dysplasia, status post laparoscopic supracervical hysterectomy.  PROCEDURE:  Laparoscopic-assisted cervical trachelectomy.  ANESTHESIA:  General endotracheal anesthesia.  SURGEON:  Jennell Corner, MD  FIRST ASSISTANT:  Ward  SECOND ASSISTANT:  PA student, Educational psychologist  INDICATIONS:  A 42 year old female, who is status post laparoscopic supracervical hysterectomy, developed abnormal Pap smears and underwent cervical biopsies with colposcopy.  Moderate dysplasia was identified.  The patient has opted to have definitive  removal of the cervix to treat her cervical dysplasia.  DESCRIPTION OF PROCEDURE:  After adequate general endotracheal anesthesia, the patient was placed in dorsal supine position.  The patient's legs were placed in the [facility] stirrups.  The patient did receive 2 g IV Ancef prior to commencement of the case.   Timeout was performed.  Straight catheterization of the bladder yielded 75 mL clear urine.  A single-tooth tenaculum was placed on the anterior cervix, and a Kahn cannula was placed in the endocervical canal to be used for uterine manipulation.   Attention was directed to the patient's abdomen.  A 5 mm infraumbilical incision was made, and a 5 mm laparoscope was advanced into the abdominal cavity under direct visualization with the Optiview cannula.  Second port site was placed in the left lower  quadrant 3 cm medial to the left anterior iliac spine.  A 5 mm trocar was advanced under direct visualization.  A third port site was placed in the right lower quadrant, again 3 cm medial to the right anterior iliac  spine, and a 5 mm trocar was advanced  under direct visualization.  Initial impression showed epiploic adhesions to the vaginal cuff and to the left sidewall.  The Harmonic scalpel was brought up, and these adhesions were meticulously dissected free so the total vaginal cuff could be  identified.  The posterior cul-de-sac was free of any disease.  Upper abdomen appeared normal.  Attention was then directed vaginally, and the cervix was grasped with 2 thyroid tenacula and circumferentially injected with 1% lidocaine with 1:100,000  epinephrine.  A direct posterior colpotomy incision was made upon entry into the posterior cul-de-sac.  A long billed weighted speculum was placed.  The uterosacral ligaments were bilaterally clamped, transected, suture ligated with 0 Vicryl suture.  The  anterior cervix was circumferentially incised with the Bovie, and the cardinal ligaments were then bilaterally clamped, transected, suture ligated with 0 Vicryl suture.  The bladder was dissected off the cervical stump, and curved Heaney-Ballantine was  placed at the distal portion of the cervical stump and transected and removed intact.  Good hemostasis was noted.  Straight catheterization of the bladder again revealed an additional 25 mL clear urine.  The vaginal cuff was then closed with a running 0  Vicryl suture, and the uterosacral ligaments were plicated centrally and the rest of vaginal cuff was closed with the 0 Vicryl suture.  Good hemostasis was noted.  Repeat laparoscopy revealed normal closure of the vaginal cuff.  No active bleeding.  The  patient's abdomen was deflated, and all trocars were removed.  The three port sites were closed with interrupted 4-0 Vicryl suture.  Dermabond was placed at the skin level.    COMPLICATIONS:  None.  ESTIMATED BLOOD LOSS:  10 mL.  INTRAOPERATIVE FLUIDS:  1500 mL.  URINE OUTPUT:  100 mL.  The patient tolerated the procedure well and was taken to recovery room in good  condition.  LN/NUANCE  D:10/15/2017 T:10/15/2017 JOB:[id number]

## 2017-10-15 NOTE — Anesthesia Procedure Notes (Signed)
Procedure Name: Intubation Date/Time: 10/15/2017 1:14 PM Performed by: Sherol DadeMacMang, Dreya Buhrman H, CRNA Pre-anesthesia Checklist: Patient identified, Emergency Drugs available, Suction available, Patient being monitored and Timeout performed Patient Re-evaluated:Patient Re-evaluated prior to induction Oxygen Delivery Method: Circle system utilized Preoxygenation: Pre-oxygenation with 100% oxygen Induction Type: IV induction Ventilation: Mask ventilation without difficulty and Oral airway inserted - appropriate to patient size Laryngoscope Size: McGraph and 3 Grade View: Grade I Tube type: Oral Tube size: 7.0 mm Number of attempts: 2 Airway Equipment and Method: Stylet and Video-laryngoscopy Placement Confirmation: ETT inserted through vocal cords under direct vision,  positive ETCO2,  CO2 detector and breath sounds checked- equal and bilateral Secured at: 21 cm Tube secured with: Tape Dental Injury: Teeth and Oropharynx as per pre-operative assessment  Difficulty Due To: Difficulty was anticipated, Difficult Airway- due to anterior larynx and Difficult Airway- due to reduced neck mobility

## 2017-10-15 NOTE — Progress Notes (Signed)
Ready for surgery . NPO . All questions answered . L/S assisted cervical trachelectomy

## 2017-10-15 NOTE — Transfer of Care (Signed)
Immediate Anesthesia Transfer of Care Note  Patient: Morgan [name]  Procedure(s) Performed: TRACHELECTOMY (N/A )  Patient Location: PACU  Anesthesia Type:General  Level of Consciousness: awake, alert , oriented and patient cooperative  Airway & Oxygen Therapy: Patient Spontanous Breathing and Patient connected to face mask oxygen  Post-op Assessment: Report given to RN, Post -op Vital signs reviewed and stable and Patient moving all extremities  Post vital signs: Reviewed and stable  Last Vitals:  Vitals Value Taken Time  BP 157/78 10/15/2017  3:04 PM  Temp 36.9 C 10/15/2017  3:04 PM  Pulse 114 10/15/2017  3:11 PM  Resp 15 10/15/2017  3:11 PM  SpO2 92 % 10/15/2017  3:11 PM  Vitals shown include unvalidated device data.  Last Pain:  Vitals:   10/15/17 1504  TempSrc: Temporal  PainSc: Asleep         Complications: No apparent anesthesia complications

## 2017-10-15 NOTE — Progress Notes (Signed)
Pt spit up small amount clear liquid while trying to dress   IV reinstated  And fluid continues

## 2017-10-15 NOTE — Anesthesia Preprocedure Evaluation (Addendum)
Anesthesia Evaluation  Patient identified by MRN, date of birth, ID band Patient awake    Reviewed: Allergy & Precautions, NPO status , Patient's Chart, lab work & pertinent test results  History of Anesthesia Complications (+) PONV and history of anesthetic complications  Airway Mallampati: III       Dental   Pulmonary neg sleep apnea, former smoker,           Cardiovascular hypertension, Pt. on medications (-) Past MI and (-) CHF (-) dysrhythmias (-) Valvular Problems/Murmurs     Neuro/Psych neg Seizures    GI/Hepatic Neg liver ROS, hiatal hernia, GERD  Medicated and Poorly Controlled,  Endo/Other  neg diabetes  Renal/GU negative Renal ROS     Musculoskeletal   Abdominal   Peds  Hematology   Anesthesia Other Findings   Reproductive/Obstetrics                            Anesthesia Physical Anesthesia Plan  ASA: II  Anesthesia Plan: General   Post-op Pain Management:    Induction: Intravenous  PONV Risk Score and Plan:   Airway Management Planned: Oral ETT  Additional Equipment:   Intra-op Plan:   Post-operative Plan:   Informed Consent: I have reviewed the patients History and Physical, chart, labs and discussed the procedure including the risks, benefits and alternatives for the proposed anesthesia with the patient or authorized representative who has indicated his/her understanding and acceptance.     Plan Discussed with:   Anesthesia Plan Comments:         Anesthesia Quick Evaluation

## 2017-10-15 NOTE — Progress Notes (Signed)
Slight bruising under left arm  Pt states was from BP cuff  Offered to have anesthesia check but pt declined

## 2017-10-15 NOTE — Brief Op Note (Signed)
10/15/2017  2:50 PM  PATIENT:  Delsa Bernammy Rather  42 y.o. female  PRE-OPERATIVE DIAGNOSIS:  cervical dysplasia  POST-OPERATIVE DIAGNOSIS:  cervical dysplasia  PROCEDURE:  Procedure(s): TRACHELECTOMY (N/A) Laparoscopic assisted SURGEON:  Surgeon(s) and Role:    * Schermerhorn, Ihor Austinhomas J, MD - Primary    * Ward, Elenora Fenderhelsea C, MD - Assisting  PHYSICIAN ASSISTANT: Materials engineerMatt Scheeler , pa student   ASSISTANTS: none   ANESTHESIA:   general  EBL:  10 mL   BLOOD ADMINISTERED:none  DRAINS: none   LOCAL MEDICATIONS USED:  MARCAINE     SPECIMEN:  Source of Specimen:  cervix  DISPOSITION OF SPECIMEN:  PATHOLOGY  COUNTS:  YES  TOURNIQUET:  * No tourniquets in log *  DICTATION: .Other Dictation: Dictation Number [id number]   PLAN OF CARE: Discharge to home after PACU  PATIENT DISPOSITION:  PACU - hemodynamically stable.   Delay start of Pharmacological VTE agent (>24hrs) due to surgical blood loss or risk of bleeding: not applicable

## 2017-10-15 NOTE — Discharge Instructions (Addendum)
General Anesthesia, Adult, Care After These instructions provide you with information about caring for yourself after your procedure. Your health care provider may also give you more specific instructions. Your treatment has been planned according to current medical practices, but problems sometimes occur. Call your health care provider if you have any problems or questions after your procedure. What can I expect after the procedure? After the procedure, it is common to have:  Vomiting.  A sore throat.  Mental slowness.  It is common to feel:  Nauseous.  Cold or shivery.  Sleepy.  Tired.  Sore or achy, even in parts of your body where you did not have surgery.  Follow these instructions at home: For at least 24 hours after the procedure:  Do not: ? Participate in activities where you could fall or become injured. ? Drive. ? Use heavy machinery. ? Drink alcohol. ? Take sleeping pills or medicines that cause drowsiness. ? Make important decisions or sign legal documents. ? Take care of children on your own.  Rest. Eating and drinking  If you vomit, drink water, juice, or soup when you can drink without vomiting.  Drink enough fluid to keep your urine clear or pale yellow.  Make sure you have little or no nausea before eating solid foods.  Follow the diet recommended by your health care provider. General instructions  Have a responsible adult stay with you until you are awake and alert.  Return to your normal activities as told by your health care provider. Ask your health care provider what activities are safe for you.  Take over-the-counter and prescription medicines only as told by your health care provider.  If you smoke, do not smoke without supervision.  Keep all follow-up visits as told by your health care provider. This is important. Contact a health care provider if:  You continue to have nausea or vomiting at home, and medicines are not helpful.  You  cannot drink fluids or start eating again.  You cannot urinate after 8-12 hours.  You develop a skin rash.  You have fever.  You have increasing redness at the site of your procedure. Get help right away if:  You have difficulty breathing.  You have chest pain.  You have unexpected bleeding.  You feel that you are having a life-threatening or urgent problem. This information is not intended to replace advice given to you by your health care provider. Make sure you discuss any questions you have with your health care provider. Document Released: 07/13/2000 Document Revised: 09/09/2015 Document Reviewed: 03/21/2015 Elsevier Interactive Patient Education  2018 [employer]   Diagnostic Laparoscopy, Care After Refer to this sheet in the next few weeks. These instructions provide you with information about caring for yourself after your procedure. Your health care provider may also give you more specific instructions. Your treatment has been planned according to current medical practices, but problems sometimes occur. Call your health care provider if you have any problems or questions after your procedure. What can I expect after the procedure? After your procedure, it is common to have mild discomfort in the throat and abdomen. Follow these instructions at home:  Take over-the-counter and prescription medicines only as told by your health care provider.  Do not drive for 24 hours if you received a sedative.  Return to your normal activities as told by your health care provider.  Do not take baths, swim, or use a hot tub until your health care provider approves. You may shower.  Follow instructions from your health care provider about how to take care of your incision. Make sure you: ? Wash your hands with soap and water before you change your bandage (dressing). If soap and water are not available, use hand sanitizer. ? Change your dressing as told by your health care  provider. ? Leave stitches (sutures), skin glue, or adhesive strips in place. These skin closures may need to stay in place for 2 weeks or longer. If adhesive strip edges start to loosen and curl up, you may trim the loose edges. Do not remove adhesive strips completely unless your health care provider tells you to do that.  Check your incision area every day for signs of infection. Check for: ? More redness, swelling, or pain. ? More fluid or blood. ? Warmth. ? Pus or a bad smell.  It is your responsibility to get the results of your procedure. Ask your health care provider or the department performing the procedure when your results will be ready. Contact a health care provider if:  There is new pain in your shoulders.  You feel light-headed or faint.  You are unable to pass gas or unable to have a bowel movement.  You feel nauseous or you vomit.  You develop a rash.  You have more redness, swelling, or pain around your incision.  You have more fluid or blood coming from your incision.  Your incision feels warm to the touch.  You have pus or a bad smell coming from your incision.  You have a fever or chills. Get help right away if:  Your pain is getting worse.  You have ongoing vomiting.  The edges of your incision open up.  You have trouble breathing.  You have chest pain. This information is not intended to replace advice given to you by your health care provider. Make sure you discuss any questions you have with your health care provider. Document Released: 03/18/2015 Document Revised: 09/12/2015 Document Reviewed: 12/18/2014 Elsevier Interactive Patient Education  2018 [employer].

## 2017-10-15 NOTE — Anesthesia Post-op Follow-up Note (Signed)
Anesthesia QCDR form completed.        

## 2017-10-15 NOTE — Anesthesia Postprocedure Evaluation (Signed)
Anesthesia Post Note  Patient: Morgan [name]  Procedure(s) Performed: TRACHELECTOMY (N/A )  Patient location during evaluation: PACU Anesthesia Type: General Level of consciousness: awake and alert and oriented Pain management: pain level controlled Vital Signs Assessment: post-procedure vital signs reviewed and stable Respiratory status: spontaneous breathing Cardiovascular status: blood pressure returned to baseline Anesthetic complications: no     Last Vitals:  Vitals:   10/15/17 1610 10/15/17 1642  BP: 116/60 106/64  Pulse: (!) 108 96  Resp: 11 12  Temp: (!) 36.2 C   SpO2: 98% 97%    Last Pain:  Vitals:   10/15/17 1642  TempSrc:   PainSc: 0-No pain                 Aiza Vollrath

## 2017-10-15 NOTE — Progress Notes (Signed)
No vomiting  States wants to go home

## 2017-10-16 ENCOUNTER — Encounter: Payer: Self-pay | Admitting: Obstetrics and Gynecology

## 2017-10-16 ENCOUNTER — Other Ambulatory Visit: Payer: Self-pay | Admitting: Internal Medicine

## 2017-10-18 NOTE — Telephone Encounter (Signed)
Overdue CPE letter mailed 

## 2017-10-19 ENCOUNTER — Telehealth: Payer: Self-pay | Admitting: Internal Medicine

## 2017-10-19 MED ORDER — OMEPRAZOLE 20 MG PO CPDR: 20.0000 mg | DELAYED_RELEASE_CAPSULE | Freq: Every day | ORAL | 0 refills | Status: DC

## 2017-10-19 MED ORDER — BENAZEPRIL-HYDROCHLOROTHIAZIDE 10-12.5 MG PO TABS: 1.0000 | ORAL_TABLET | Freq: Every day | ORAL | 0 refills | Status: DC

## 2017-10-19 NOTE — Telephone Encounter (Signed)
Copied from CRM [id number]. Topic: General - Other >> Oct 19, 2017 10:17 AM Leafy Roobinson, Norma J wrote: Reason for CRM:pt is calling and just had cervical surgery on 10-15-17. Pt is unable to come in for an appointment. Pt was last seen in aug 2018. Pt needs refills on  omeprazole and benazepril-hctz sent [facility]

## 2017-10-19 NOTE — Telephone Encounter (Signed)
I have sent in 1 refill... Pt will need CPE scheduled before another refill will be sent in

## 2017-10-20 LAB — SURGICAL PATHOLOGY

## 2017-10-28 DIAGNOSIS — G8918 Other acute postprocedural pain: Secondary | ICD-10-CM | POA: Diagnosis not present

## 2017-10-28 DIAGNOSIS — R140 Abdominal distension (gaseous): Secondary | ICD-10-CM | POA: Diagnosis not present

## 2017-10-28 DIAGNOSIS — R110 Nausea: Secondary | ICD-10-CM | POA: Diagnosis not present

## 2017-11-09 DIAGNOSIS — E669 Obesity, unspecified: Secondary | ICD-10-CM | POA: Diagnosis not present

## 2017-11-15 DIAGNOSIS — E669 Obesity, unspecified: Secondary | ICD-10-CM | POA: Diagnosis not present

## 2017-12-06 ENCOUNTER — Telehealth: Payer: Self-pay | Admitting: *Deleted

## 2017-12-06 DIAGNOSIS — N930 Postcoital and contact bleeding: Secondary | ICD-10-CM | POA: Diagnosis not present

## 2017-12-06 DIAGNOSIS — E669 Obesity, unspecified: Secondary | ICD-10-CM | POA: Diagnosis not present

## 2017-12-06 DIAGNOSIS — N898 Other specified noninflammatory disorders of vagina: Secondary | ICD-10-CM | POA: Diagnosis not present

## 2017-12-06 NOTE — Telephone Encounter (Signed)
Copied from CRM [id number]. Topic: General - Other >> Dec 06, 2017  9:14 AM Gerrianne ScalePayne, Angela L wrote: Reason for CRM: pt calling stating that she was in the hospital on June and that she saw in her MyChart that her RDW was 15.2 and want to know if she need to do anything for that

## 2017-12-06 NOTE — Telephone Encounter (Signed)
No, nothing needs to be done for this

## 2017-12-10 NOTE — Telephone Encounter (Signed)
Pt is aware as instructed...  Pt also wants to find out what she can do about her cold sores, she has an appt for Sept but she has been trying OTC Abriva with minimal relief and they seem to have been coming more often recently due to increase stress... 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

## 2017-12-10 NOTE — Telephone Encounter (Signed)
Pt is aware and states she will need to call back Monday to schedule an appt

## 2017-12-10 NOTE — Telephone Encounter (Signed)
Continue Abreva. Will discuss further at OV.

## 2017-12-13 ENCOUNTER — Ambulatory Visit (INDEPENDENT_AMBULATORY_CARE_PROVIDER_SITE_OTHER): Payer: BLUE CROSS/BLUE SHIELD | Admitting: Internal Medicine

## 2017-12-13 ENCOUNTER — Encounter: Payer: Self-pay | Admitting: Internal Medicine

## 2017-12-13 ENCOUNTER — Other Ambulatory Visit: Payer: Self-pay | Admitting: Internal Medicine

## 2017-12-13 VITALS — BP 124/76 | HR 90 | Temp 98.7°F | Wt 193.0 lb

## 2017-12-13 DIAGNOSIS — R635 Abnormal weight gain: Secondary | ICD-10-CM | POA: Diagnosis not present

## 2017-12-13 DIAGNOSIS — B001 Herpesviral vesicular dermatitis: Secondary | ICD-10-CM | POA: Diagnosis not present

## 2017-12-13 DIAGNOSIS — M542 Cervicalgia: Secondary | ICD-10-CM

## 2017-12-13 DIAGNOSIS — G8929 Other chronic pain: Secondary | ICD-10-CM | POA: Diagnosis not present

## 2017-12-13 MED ORDER — VALACYCLOVIR HCL 500 MG PO TABS
500.0000 mg | ORAL_TABLET | Freq: Every day | ORAL | 3 refills | Status: DC
Start: 2017-12-13 — End: 2018-02-07

## 2017-12-13 MED ORDER — CYCLOBENZAPRINE HCL 5 MG PO TABS: 5.0000 mg | ORAL_TABLET | Freq: Every day | ORAL | 0 refills | Status: DC | PRN

## 2017-12-13 MED ORDER — PHENTERMINE HCL 15 MG PO CAPS: 15.0000 mg | ORAL_CAPSULE | ORAL | 0 refills | Status: DC

## 2017-12-13 NOTE — Patient Instructions (Signed)
Cold Sore A cold sore, also called a fever blister, is a skin infection that is caused by a virus. This infection causes small, fluid-filled sores to form inside of the mouth or on the lips, gums, nose, chin, or cheeks. Cold sores can spread to other parts of the body, such as the eyes or fingers. Cold sores can be spread or passed from person to person (contagious) until the sores crust over completely. Cold sores can be spread through close contact, such as kissing or sharing a drinking glass. Follow these instructions at home: Medicines  Take or apply over-the-counter and prescription medicines only as told by your doctor.  Use a cotton-tip swab to apply creams or gels to your sores. Sore Care  Do not touch the sores or pick the scabs.  Wash your hands often. Do not touch your eyes without washing your hands first.  Keep the sores clean and dry.  If directed, apply ice to the sores:  Put ice in a plastic bag.  Place a towel between your skin and the bag.  Leave the ice on for 20 minutes, 2-3 times per day. Lifestyle  Do not kiss, have oral sex, or share personal items until your sores heal.  Eat a soft, bland diet. Avoid eating hot, cold, or salty foods. These can hurt your mouth.  Use a straw if it hurts to drink out of a glass.  Avoid the sun and limit your stress if these things trigger outbreaks. If sun causes cold sores, apply sunscreen on your lips before being out in the sun. Contact a doctor if:  You have symptoms for more than two weeks.  You have pus coming from the sores.  You have redness that is spreading.  You have pain or irritation in your eye.  You get sores on your genitals.  Your sores do not heal within two weeks.  You get cold sores often. Get help right away if:  You have a fever and your symptoms suddenly get worse.  You have a headache and confusion. This information is not intended to replace advice given to you by your health care  provider. Make sure you discuss any questions you have with your health care provider. Document Released: 10/06/2011 Document Revised: 09/12/2015 Document Reviewed: 01/25/2015 Elsevier Interactive Patient Education  2018 [employer]vier Inc.  

## 2017-12-13 NOTE — Progress Notes (Signed)
Subjective:    Patient ID: Morgan [name], female    DOB: 1975/07/08, 42 y.o.   MRN: [id number]  HPI  Pt presents to the clinic with c/o cold sores. She reports she has been getting these for years, but they are becoming more frequent. She gets them about 2-3 times per month. She uses Abreva OTC with minimal relief.  She also requests a refill of Flexeril today. She takes this for chronic neck pain/muscle tension. She has had a C5-C7 fusion in the past.  She is also concerned about weight gain. She reports her GYN had her stop taking Hydroxycut OTC. Since that time, she continues to gain weight. She has gained 27 lbs in the last year. She reports she consumes 1200 calories per day. She walks on the treadmill 30 minutes at least 3 days per week. She has had her thyroid tested in the past and it was normal.   Review of Systems      Past Medical History:  Diagnosis Date  . Complication of anesthesia    STATES TOLD REINTUBATION WITH HYSTERECTOMY  . Difficult intubation   . GERD (gastroesophageal reflux disease)   . Headache    MIGRAINES  . History of hiatal hernia   . Hypertension   . PONV (postoperative nausea and vomiting)     Current Outpatient Medications  Medication Sig Dispense Refill  . benazepril-hydrochlorthiazide (LOTENSIN HCT) 10-12.5 MG tablet Take 1 tablet by mouth daily. 90 tablet 0  . cyclobenzaprine (FLEXERIL) 5 MG tablet Take 1 tablet (5 mg total) by mouth 2 (two) times daily as needed for muscle spasms. 60 tablet 0  . omeprazole (PRILOSEC) 20 MG capsule Take 1 capsule (20 mg total) by mouth daily. 90 capsule 0  . OVER THE COUNTER MEDICATION Take 2 tablets by mouth daily. HYDROXYCUT EXTREME     No current facility-administered medications for this visit.     Allergies  Allergen Reactions  . Adhesive [Tape] Other (See Comments)    Bruises skin  . Latex Other (See Comments)    Swelling (vaginal)  . Lexapro [Escitalopram Oxalate] Other (See Comments)    Loss  taste  . Metoprolol Other (See Comments)    Lowers heart rate  . Topamax [Topiramate]     Loss taste    Family History  Problem Relation Age of Onset  . Arthritis Mother   . Arthritis Father   . Stroke Father   . Hypertension Father   . Heart disease Maternal Uncle   . Arthritis Maternal Grandmother   . Heart disease Maternal Grandmother   . Hypertension Maternal Grandmother   . Arthritis Maternal Grandfather   . Lung cancer Maternal Grandfather   . Heart disease Maternal Grandfather   . Arthritis Paternal Grandmother   . Heart disease Paternal Grandmother   . Hypertension Paternal Grandmother   . Arthritis Paternal Grandfather   . Heart disease Paternal Grandfather   . Stroke Paternal Grandfather     Social History   Socioeconomic History  . Marital status: Married    Spouse name: Not on file  . Number of children: Not on file  . Years of education: Not on file  . Highest education level: Not on file  Occupational History  . Not on file  Social Needs  . Financial resource strain: Not on file  . Food insecurity:    Worry: Not on file    Inability: Not on file  . Transportation needs:    Medical: Not on file  Non-medical: Not on file  Tobacco Use  . Smoking status: Former Smoker    Last attempt to quit: 09/30/2016    Years since quitting: 1.2  . Smokeless tobacco: Never Used  Substance and Sexual Activity  . Alcohol use: Yes    Comment: occasional  . Drug use: No  . Sexual activity: Yes  Lifestyle  . Physical activity:    Days per week: Not on file    Minutes per session: Not on file  . Stress: Not on file  Relationships  . Social connections:    Talks on phone: Not on file    Gets together: Not on file    Attends religious service: Not on file    Active member of club or organization: Not on file    Attends meetings of clubs or organizations: Not on file    Relationship status: Not on file  . Intimate partner violence:    Fear of current or ex  partner: Not on file    Emotionally abused: Not on file    Physically abused: Not on file    Forced sexual activity: Not on file  Other Topics Concern  . Not on file  Social History Narrative  . Not on file     Constitutional: Pt reports weight gain. Denies fever, malaise, fatigue, headache.  HEENT: Denies eye pain, eye redness, ear pain, ringing in the ears, wax buildup, runny nose, nasal congestion, bloody nose, or sore throat. Respiratory: Denies difficulty breathing, shortness of breath, cough or sputum production.   Cardiovascular: Denies chest pain, chest tightness, palpitations or swelling in the hands or feet.  Musculoskeletal: Pt reports intermittent neck pain. Denies decrease in range of motion, difficulty with gait, or joint swelling.  Skin: Pt reports intermittent cold sores. Denies redness, rashes.  Neurological: Denies dizziness, difficulty with memory, difficulty with speech or problems with balance and coordination.    No other specific complaints in a complete review of systems (except as listed in HPI above).  Objective:   Physical Exam   BP 124/76   Pulse 90   Temp 98.7 F (37.1 C) (Oral)   Wt 193 lb (87.5 kg)   SpO2 98%   BMI 31.15 kg/m  Wt Readings from Last 3 Encounters:  12/13/17 193 lb (87.5 kg)  10/15/17 187 lb 4 oz (84.9 kg)  09/30/17 185 lb (83.9 kg)    General: Appears her stated age, well developed, well nourished in NAD. Skin: Warm, dry and intact. Cold sore noted on the left upper lip. Cardiovascular: Normal rate and rhythm. S1,S2 noted.  No murmur, rubs or gallops noted.  Pulmonary/Chest: Normal effort and positive vesicular breath sounds. No respiratory distress. No wheezes, rales or ronchi noted.  Musculoskeletal: Decreased flexion of the cervical spine. Normal extension and rotation. Mild bony tenderness noted over the cervical spine. Neurological: Alert and oriented.    BMET    Component Value Date/Time   NA 140 09/30/2017 1334    K 3.6 09/30/2017 1334   CL 101 09/30/2017 1334   CO2 26 09/30/2017 1334   GLUCOSE 93 09/30/2017 1334   BUN 12 09/30/2017 1334   CREATININE 0.74 09/30/2017 1334   CREATININE 0.76 11/23/2016 1527   CALCIUM 9.7 09/30/2017 1334   GFRNONAA >60 09/30/2017 1334   GFRAA >60 09/30/2017 1334    Lipid Panel  No results found for: CHOL, TRIG, HDL, CHOLHDL, VLDL, LDLCALC  CBC    Component Value Date/Time   WBC 9.1 09/30/2017 1334  RBC 5.02 09/30/2017 1334   HGB 13.6 09/30/2017 1334   HCT 40.4 09/30/2017 1334   PLT 319 09/30/2017 1334   MCV 80.4 09/30/2017 1334   MCH 27.1 09/30/2017 1334   MCHC 33.7 09/30/2017 1334   RDW 15.2 (H) 09/30/2017 1334    Hgb A1C No results found for: HGBA1C         Assessment & Plan:   Abnormal Weight Gain:  Will trial Phentermine Encouraged adequate caloric intake Encouraged daily exercise Will monitor BP  Chronic Neck Pain:  Encouraged routine stretching Heat and massage may be helpful Flexeril refilled today  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

## 2017-12-16 LAB — HSV 1/2 AB (IGM), IFA W/RFLX TITER
HSV 1 IgM Screen: NEGATIVE
HSV 2 IgM Screen: NEGATIVE

## 2017-12-16 LAB — HSV(HERPES SIMPLEX VRS) I + II AB-IGG: HAV 1 IGG,TYPE SPECIFIC AB: 36.9 index — ABNORMAL HIGH

## 2017-12-29 ENCOUNTER — Other Ambulatory Visit: Payer: Self-pay | Admitting: Internal Medicine

## 2018-01-17 ENCOUNTER — Ambulatory Visit (INDEPENDENT_AMBULATORY_CARE_PROVIDER_SITE_OTHER): Payer: BLUE CROSS/BLUE SHIELD | Admitting: Internal Medicine

## 2018-01-17 ENCOUNTER — Encounter: Payer: Self-pay | Admitting: Internal Medicine

## 2018-01-17 VITALS — BP 122/82 | HR 107 | Temp 98.4°F | Ht 65.0 in | Wt 194.0 lb

## 2018-01-17 DIAGNOSIS — Z0000 Encounter for general adult medical examination without abnormal findings: Secondary | ICD-10-CM

## 2018-01-17 DIAGNOSIS — I10 Essential (primary) hypertension: Secondary | ICD-10-CM | POA: Diagnosis not present

## 2018-01-17 DIAGNOSIS — Z23 Encounter for immunization: Secondary | ICD-10-CM

## 2018-01-17 DIAGNOSIS — M542 Cervicalgia: Secondary | ICD-10-CM | POA: Diagnosis not present

## 2018-01-17 DIAGNOSIS — G8929 Other chronic pain: Secondary | ICD-10-CM

## 2018-01-17 DIAGNOSIS — E559 Vitamin D deficiency, unspecified: Secondary | ICD-10-CM

## 2018-01-17 DIAGNOSIS — N3281 Overactive bladder: Secondary | ICD-10-CM | POA: Insufficient documentation

## 2018-01-17 DIAGNOSIS — K219 Gastro-esophageal reflux disease without esophagitis: Secondary | ICD-10-CM | POA: Diagnosis not present

## 2018-01-17 MED ORDER — OMEPRAZOLE 20 MG PO CPDR: 20.0000 mg | DELAYED_RELEASE_CAPSULE | Freq: Two times a day (BID) | ORAL | 3 refills | Status: DC

## 2018-01-17 MED ORDER — CYCLOBENZAPRINE HCL 10 MG PO TABS: 10.0000 mg | ORAL_TABLET | Freq: Two times a day (BID) | ORAL | 0 refills | Status: DC | PRN

## 2018-01-17 NOTE — Assessment & Plan Note (Signed)
Continue Enablex, prescribed by GYN

## 2018-01-17 NOTE — Assessment & Plan Note (Signed)
Deteriorated Increase Omeprazole to BID Discussed how avoiding triggers and exercise for weight loss can help improve reflux CBC and CMET today

## 2018-01-17 NOTE — Patient Instructions (Signed)

## 2018-01-17 NOTE — Progress Notes (Signed)
Subjective:    Patient ID: Morgan [name], female    DOB: 1975/12/31, 42 y.o.   MRN: [id number]  HPI  Pt presents to the clinic today for her annual exam. She is also due to follow up chronic conditions.  Chronic Neck Pain: Xray from 09/2016 reviewed. She takes Ibuprofen and Flexeril as needed. She reports she has been taking Flexeril 10 mg 2 x day with much better relief. She would like to know if she can get this refilled today.  HTN: Her BP today is 122/82. She is taking Benazepril-HCT as prescribed. ECG from 09/2017 reviewed.  GERD: Triggered by eating late and laying down. She has breakthrough on Omeprazole. It is better when she takes the Omeprazole 2 x day. There is no upper GI on file, but she reports she has had this done.  OAB: She c/o urinary frequency. She is taking Enablex as prescribed. She follows with her OB/GYN for this.  Flu: never Tetanus: ? 2010 Pap Smear: 2012 partial hysterectomy Mammogram: never Vision Screening: as needed Dentist: biannually  Diet: She does eat lean meat. She consumes fruits and veggies daily. She occassionally eats fried foods. She drinks mostly water, some Propel Exercise: walking  Review of Systems      Past Medical History:  Diagnosis Date  . Complication of anesthesia    STATES TOLD REINTUBATION WITH HYSTERECTOMY  . Difficult intubation   . GERD (gastroesophageal reflux disease)   . Headache    MIGRAINES  . History of hiatal hernia   . Hypertension   . PONV (postoperative nausea and vomiting)     Current Outpatient Medications  Medication Sig Dispense Refill  . benazepril-hydrochlorthiazide (LOTENSIN HCT) 10-12.5 MG tablet TAKE 1 TABLET BY MOUTH  DAILY 90 tablet 0  . cyclobenzaprine (FLEXERIL) 5 MG tablet Take 1 tablet (5 mg total) by mouth daily as needed for muscle spasms. 20 tablet 0  . darifenacin (ENABLEX) 7.5 MG 24 hr tablet     . Estradiol 10 MCG TABS vaginal tablet   0  . ibuprofen (ADVIL,MOTRIN) 600 MG tablet Take  by mouth.    Marland Kitchen omeprazole (PRILOSEC) 20 MG capsule TAKE 1 CAPSULE BY MOUTH  DAILY 90 capsule 0  . phentermine 15 MG capsule Take 1 capsule (15 mg total) by mouth every morning. 30 capsule 0  . valACYclovir (VALTREX) 500 MG tablet Take 1 tablet (500 mg total) by mouth daily. 90 tablet 3   No current facility-administered medications for this visit.     Allergies  Allergen Reactions  . Adhesive [Tape] Other (See Comments)    Bruises skin  . Latex Other (See Comments)    Swelling (vaginal)  . Lexapro [Escitalopram Oxalate] Other (See Comments)    Loss taste  . Metoprolol Other (See Comments)    Lowers heart rate  . Topamax [Topiramate]     Loss taste    Family History  Problem Relation Age of Onset  . Arthritis Mother   . Arthritis Father   . Stroke Father   . Hypertension Father   . Heart disease Maternal Uncle   . Arthritis Maternal Grandmother   . Heart disease Maternal Grandmother   . Hypertension Maternal Grandmother   . Arthritis Maternal Grandfather   . Lung cancer Maternal Grandfather   . Heart disease Maternal Grandfather   . Arthritis Paternal Grandmother   . Heart disease Paternal Grandmother   . Hypertension Paternal Grandmother   . Arthritis Paternal Grandfather   . Heart disease Paternal Grandfather   .  Stroke Paternal Grandfather     Social History   Socioeconomic History  . Marital status: Married    Spouse name: Not on file  . Number of children: Not on file  . Years of education: Not on file  . Highest education level: Not on file  Occupational History  . Not on file  Social Needs  . Financial resource strain: Not on file  . Food insecurity:    Worry: Not on file    Inability: Not on file  . Transportation needs:    Medical: Not on file    Non-medical: Not on file  Tobacco Use  . Smoking status: Former Smoker    Last attempt to quit: 09/30/2016    Years since quitting: 1.2  . Smokeless tobacco: Never Used  Substance and Sexual Activity    . Alcohol use: Yes    Comment: occasional  . Drug use: No  . Sexual activity: Yes  Lifestyle  . Physical activity:    Days per week: Not on file    Minutes per session: Not on file  . Stress: Not on file  Relationships  . Social connections:    Talks on phone: Not on file    Gets together: Not on file    Attends religious service: Not on file    Active member of club or organization: Not on file    Attends meetings of clubs or organizations: Not on file    Relationship status: Not on file  . Intimate partner violence:    Fear of current or ex partner: Not on file    Emotionally abused: Not on file    Physically abused: Not on file    Forced sexual activity: Not on file  Other Topics Concern  . Not on file  Social History Narrative  . Not on file     Constitutional: Denies fever, malaise, fatigue, headache or abrupt weight changes.  HEENT: Denies eye pain, eye redness, ear pain, ringing in the ears, wax buildup, runny nose, nasal congestion, bloody nose, or sore throat. Respiratory: Denies difficulty breathing, shortness of breath, cough or sputum production.   Cardiovascular: Denies chest pain, chest tightness, palpitations or swelling in the hands or feet.  Gastrointestinal: Pt reports reflux. Denies abdominal pain, bloating, constipation, diarrhea or blood in the stool.  GU: Pt reports urinary frequency. Denies urgency, pain with urination, burning sensation, blood in urine, odor or discharge. Musculoskeletal: Pt reports chronic neck pain. Denies decrease in range of motion, difficulty with gait, or joint swelling.  Skin: Denies redness, rashes, lesions or ulcercations.  Neurological: Denies dizziness, difficulty with memory, difficulty with speech or problems with balance and coordination.  Psych: Denies anxiety, depression, SI/HI.  No other specific complaints in a complete review of systems (except as listed in HPI above).  Objective:   Physical Exam  BP 122/82    Pulse (!) 107   Temp 98.4 F (36.9 C) (Oral)   Ht 5\' 5"  (1.651 m)   Wt 194 lb (88 kg)   SpO2 98%   BMI 32.28 kg/m  Wt Readings from Last 3 Encounters:  01/17/18 194 lb (88 kg)  12/13/17 193 lb (87.5 kg)  10/15/17 187 lb 4 oz (84.9 kg)    General: Appears her stated age, obese, in NAD. Skin: Warm, dry and intact.  HEENT: Head: normal shape and size; Eyes: sclera white, no icterus, conjunctiva pink, PERRLA and EOMs intact; Ears: Tm's gray and intact, normal light reflex; Throat/Mouth: Teeth present, mucosa  pink and moist, no exudate, lesions or ulcerations noted.  Neck:  Neck supple, trachea midline. No masses, lumps or thyromegaly present.  Cardiovascular: Normal rate and rhythm. S1,S2 noted.  No murmur, rubs or gallops noted. No JVD or BLE edema.  Pulmonary/Chest: Normal effort and positive vesicular breath sounds. No respiratory distress. No wheezes, rales or ronchi noted.  Abdomen: Soft and nontender. Normal bowel sounds. No distention or masses noted. Liver, spleen and kidneys non palpable. Musculoskeletal: Strength 5/5 BUE/BLE. No difficulty with gait.  Neurological: Alert and oriented. Cranial nerves II-XII grossly intact. Coordination normal.  Psychiatric: Mood and affect normal. Behavior is normal. Judgment and thought content normal.     BMET    Component Value Date/Time   NA 140 09/30/2017 1334   K 3.6 09/30/2017 1334   CL 101 09/30/2017 1334   CO2 26 09/30/2017 1334   GLUCOSE 93 09/30/2017 1334   BUN 12 09/30/2017 1334   CREATININE 0.74 09/30/2017 1334   CREATININE 0.76 11/23/2016 1527   CALCIUM 9.7 09/30/2017 1334   GFRNONAA >60 09/30/2017 1334   GFRAA >60 09/30/2017 1334    Lipid Panel  No results found for: CHOL, TRIG, HDL, CHOLHDL, VLDL, LDLCALC  CBC    Component Value Date/Time   WBC 9.1 09/30/2017 1334   RBC 5.02 09/30/2017 1334   HGB 13.6 09/30/2017 1334   HCT 40.4 09/30/2017 1334   PLT 319 09/30/2017 1334   MCV 80.4 09/30/2017 1334   MCH 27.1  09/30/2017 1334   MCHC 33.7 09/30/2017 1334   RDW 15.2 (H) 09/30/2017 1334    Hgb A1C No results found for: HGBA1C          Assessment & Plan:   Preventative Health Maintenance:  Flu shot today Tetanus UTD She no longer needs pap smears, pelvic exams every 5 years Will start screening mammograms at 47 Encouraged her to consume a balanced diet and exercise regimen Advised her to see an eye doctor and dentist annually Will check CBC, CMET, Lipid and Vit D today.  RTC in 1 year, sooner if needed Nicki Reaper, NP

## 2018-01-17 NOTE — Assessment & Plan Note (Signed)
Controlled on Benazepril HCT Reinforced DASH diet and exercise for weight loss CBC and CMET today

## 2018-01-17 NOTE — Assessment & Plan Note (Signed)
Deteriorated Increase Flexeril to 10 mg BID Continue Ibuprofen as needed

## 2018-01-18 LAB — COMPREHENSIVE METABOLIC PANEL
ALBUMIN: 4.5 g/dL (ref 3.5–5.2)
ALK PHOS: 85 U/L (ref 39–117)
ALT: 14 U/L (ref 0–35)
AST: 18 U/L (ref 0–37)
BUN: 17 mg/dL (ref 6–23)
CALCIUM: 9.6 mg/dL (ref 8.4–10.5)
CO2: 29 mEq/L (ref 19–32)
Chloride: 101 mEq/L (ref 96–112)
Creatinine, Ser: 0.99 mg/dL (ref 0.40–1.20)
GFR: 65.87 mL/min (ref >60.00)
Glucose, Bld: 106 mg/dL — ABNORMAL HIGH (ref 70–99)
POTASSIUM: 3.7 meq/L (ref 3.5–5.1)
SODIUM: 141 meq/L (ref 135–145)
TOTAL PROTEIN: 7.6 g/dL (ref 6.0–8.3)
Total Bilirubin: 0.3 mg/dL (ref 0.2–1.2)

## 2018-01-18 LAB — CBC
HEMATOCRIT: 36.6 % (ref 36.0–46.0)
HEMOGLOBIN: 12 g/dL (ref 12.0–15.0)
MCHC: 32.7 g/dL (ref 30.0–36.0)
MCV: 80.4 fl (ref 78.0–100.0)
PLATELETS: 330 10*3/uL (ref 150.0–400.0)
RBC: 4.56 Mil/uL (ref 3.87–5.11)
RDW: 14.5 % (ref 11.5–15.5)
WBC: 9.6 10*3/uL (ref 4.0–10.5)

## 2018-01-18 LAB — LIPID PANEL
CHOLESTEROL: 241 mg/dL — AB (ref 0–200)
HDL: 68.5 mg/dL (ref >39.00)
LDL Cholesterol: 145 mg/dL — ABNORMAL HIGH (ref 0–99)
NonHDL: 172.64
Total CHOL/HDL Ratio: 4
Triglycerides: 136 mg/dL (ref 0.0–149.0)
VLDL: 27.2 mg/dL (ref 0.0–40.0)

## 2018-01-18 LAB — VITAMIN D 25 HYDROXY (VIT D DEFICIENCY, FRACTURES): VITD: 25.58 ng/mL — AB (ref 30.00–100.00)

## 2018-01-20 ENCOUNTER — Telehealth: Payer: Self-pay | Admitting: Internal Medicine

## 2018-01-20 NOTE — Telephone Encounter (Signed)
Copied from CRM [id number]. Topic: Quick Communication - See Telephone Encounter >> Jan 20, 2018  9:04 AM Morgan [name] wrote: CRM for notification. See Telephone encounter for: 01/20/18.  Patient has questions about her blood work that she seen in [place]. Patient had blood work on Monday 9/30

## 2018-01-21 NOTE — Telephone Encounter (Signed)
  Last read by Delsa Bern at 11:01 AM on 01/21/2018

## 2018-01-21 NOTE — Telephone Encounter (Signed)
msg sent via my chart

## 2018-01-27 DIAGNOSIS — Z8639 Personal history of other endocrine, nutritional and metabolic disease: Secondary | ICD-10-CM | POA: Diagnosis not present

## 2018-01-27 DIAGNOSIS — K210 Gastro-esophageal reflux disease with esophagitis: Secondary | ICD-10-CM | POA: Diagnosis not present

## 2018-01-27 DIAGNOSIS — Z87891 Personal history of nicotine dependence: Secondary | ICD-10-CM | POA: Diagnosis not present

## 2018-02-07 ENCOUNTER — Encounter: Payer: Self-pay | Admitting: Internal Medicine

## 2018-02-07 MED ORDER — BENAZEPRIL-HYDROCHLOROTHIAZIDE 10-12.5 MG PO TABS: 1.0000 | ORAL_TABLET | Freq: Every day | ORAL | 2 refills | Status: DC

## 2018-02-07 MED ORDER — VALACYCLOVIR HCL 500 MG PO TABS: 500.0000 mg | ORAL_TABLET | Freq: Every day | ORAL | 2 refills | Status: DC

## 2018-02-07 MED ORDER — CYCLOBENZAPRINE HCL 10 MG PO TABS: 10.0000 mg | ORAL_TABLET | Freq: Two times a day (BID) | ORAL | 0 refills | Status: DC | PRN

## 2018-02-07 NOTE — Telephone Encounter (Signed)
Pt is requesting for her medications to now be sent to mail order.... Pt also wants the Flexeril... Please advise if okay to send to mail order

## 2018-03-22 ENCOUNTER — Ambulatory Visit: Payer: BLUE CROSS/BLUE SHIELD | Admitting: Gastroenterology

## 2018-03-22 ENCOUNTER — Other Ambulatory Visit: Payer: Self-pay

## 2018-04-04 ENCOUNTER — Encounter: Payer: Self-pay | Admitting: Gastroenterology

## 2018-04-04 ENCOUNTER — Other Ambulatory Visit: Payer: Self-pay

## 2018-04-04 ENCOUNTER — Ambulatory Visit (INDEPENDENT_AMBULATORY_CARE_PROVIDER_SITE_OTHER): Payer: BLUE CROSS/BLUE SHIELD | Admitting: Gastroenterology

## 2018-04-04 VITALS — BP 115/78 | HR 106 | Ht 65.0 in | Wt 188.0 lb

## 2018-04-04 DIAGNOSIS — K219 Gastro-esophageal reflux disease without esophagitis: Secondary | ICD-10-CM

## 2018-04-04 DIAGNOSIS — K2100 Gastro-esophageal reflux disease with esophagitis, without bleeding: Secondary | ICD-10-CM

## 2018-04-04 NOTE — Progress Notes (Signed)
Gastroenterology Consultation  Referring Provider:     Aquilla HackerNordbladh, Louise, PA-C Primary Care Physician:  Lorre MunroeBaity, Morgan W, NP Primary Gastroenterologist:  Dr. Servando SnareWohl     Reason for Consultation:     GERD        HPI:   Morgan [name] is a 42 y.o. y/o female referred for consultation & management of GERD by Dr. Sampson [name], Morgan Oxfordegina W, NP.  This patient comes in today after being [place].  The patient reports that she was taking omeprazole once a day in the morning and went to the ENT doctor because of sores in the back of her mouth and severe heartburn.  The patient also reports that she had choking and melena night during that time.  The patient was started on omeprazole 40 mg twice a day and told to follow up with gastrology for possible Barrett's esophagus.  There is no report of any black stools or bloody stools.  The patient also denies any unexplained weight loss.  She also reports that she has been doing very well on the present medication and is completely asymptomatic.  There is no report of any dysphagia.  She does report that she had an upper endoscopy approximate 7 years ago that was reported to be normal.  Past Medical History:  Diagnosis Date  . Complication of anesthesia    STATES TOLD REINTUBATION WITH HYSTERECTOMY  . Difficult intubation   . GERD (gastroesophageal reflux disease)   . Headache    MIGRAINES  . History of hiatal hernia   . Hypertension   . PONV (postoperative nausea and vomiting)     Past Surgical History:  Procedure Laterality Date  . ABDOMINAL HYSTERECTOMY    . NECK SURGERY     FUSION C5-C7  . THYROIDECTOMY    . TRACHELECTOMY N/A 10/15/2017   Procedure: TRACHELECTOMY;  Surgeon: Schermerhorn, Ihor Austinhomas J, MD;  Location: [facility] ORS;  Service: Gynecology;  Laterality: N/A;  . TUBAL LIGATION      Prior to Admission medications   Medication Sig Start Date End Date Taking? Authorizing Provider    benazepril-hydrochlorthiazide (LOTENSIN HCT) 10-12.5 MG tablet Take 1 tablet by mouth daily. 02/07/18  Yes [name], Morgan Oxfordegina W, NP  cyclobenzaprine (FLEXERIL) 10 MG tablet Take 1 tablet (10 mg total) by mouth 2 (two) times daily as needed for muscle spasms. 02/07/18  Yes [name], Morgan Oxfordegina W, NP  ibuprofen (ADVIL,MOTRIN) 600 MG tablet Take by mouth. 10/18/17  Yes [provider]  omeprazole (PRILOSEC) 20 MG capsule Take 1 capsule (20 mg total) by mouth 2 (two) times daily before a meal. 01/17/18  Yes [name], Morgan Oxfordegina W, NP  valACYclovir (VALTREX) 500 MG tablet Take 1 tablet (500 mg total) by mouth daily. 02/07/18  Yes Lorre MunroeBaity, Morgan W, NP    Family History  Problem Relation Age of Onset  . Arthritis Mother   . Arthritis Father   . Stroke Father   . Hypertension Father   . Heart disease Maternal Uncle   . Arthritis Maternal Grandmother   . Heart disease Maternal Grandmother   . Hypertension Maternal Grandmother   . Arthritis Maternal Grandfather   . Lung cancer Maternal Grandfather   . Heart disease Maternal Grandfather   . Arthritis Paternal Grandmother   . Heart disease Paternal Grandmother   . Hypertension Paternal Grandmother   . Arthritis Paternal Grandfather   . Heart disease Paternal Grandfather   . Stroke Paternal Grandfather  Social History   Tobacco Use  . Smoking status: Former Smoker    Last attempt to quit: 09/30/2016    Years since quitting: 1.5  . Smokeless tobacco: Never Used  Substance Use Topics  . Alcohol use: Yes    Comment: occasional  . Drug use: No    Allergies as of 04/04/2018 - Review Complete 04/04/2018  Allergen Reaction Noted  . Adhesive [tape] Other (See Comments) 09/24/2017  . Latex Other (See Comments) 09/24/2017  . Lexapro [escitalopram oxalate] Other (See Comments) 10/15/2016  . Metoprolol Other (See Comments) 10/29/2016  . Topamax [topiramate]  10/15/2016    Review of Systems:    All systems reviewed and negative except where noted in  HPI.   Physical Exam:  BP 115/78   Pulse (!) 106   Ht 5\' 5"  (1.651 m)   Wt 188 lb (85.3 kg)   BMI 31.28 kg/m  No LMP recorded. Patient has had a hysterectomy. General:   Alert,  Well-developed, well-nourished, pleasant and cooperative in NAD Head:  Normocephalic and atraumatic. Eyes:  Sclera clear, no icterus.   Conjunctiva pink. Ears:  Normal auditory acuity. Nose:  No deformity, discharge, or lesions. Mouth:  No deformity or lesions,oropharynx pink & moist. Neck:  Supple; no masses or thyromegaly. Lungs:  Respirations even and unlabored.  Clear throughout to auscultation.   No wheezes, crackles, or rhonchi. No acute distress. Heart:  Regular rate and rhythm; no murmurs, clicks, rubs, or gallops. Abdomen:  Normal bowel sounds.  No bruits.  Soft, non-tender and non-distended without masses, hepatosplenomegaly or hernias noted.  No guarding or rebound tenderness.  Negative Carnett sign.   Rectal:  Deferred.  Msk:  Symmetrical without gross deformities.  Good, equal movement & strength bilaterally. Pulses:  Normal pulses noted. Extremities:  No clubbing or edema.  No cyanosis. Neurologic:  Alert and oriented x3;  grossly normal neurologically. Skin:  Intact without significant lesions or rashes.  No jaundice. Lymph Nodes:  No significant cervical adenopathy. Psych:  Alert and cooperative. Normal mood and affect.  Imaging Studies: No results found.  Assessment and Plan:   Morgan [name] is a 42 y.o. y/o female Who comes in with severe heartburn symptoms that is now being controlled on omeprazole 40 mg twice a day.  The patient is also having like some modifications by cutting out foods that give her worse heartburn and she has stopped eating for 3 hours before she goes to sleep.  The patient has been told that her options are to continue the present medications at 40 mg of Prilosec twice a day or she can try to only take the evening dose thereby decreasing the exposure of the esophagus  to acid all night long thereby may be improving heard deli reflux symptoms.  There is also the possibility of switching her to Dexilant 60 mg to be taken in the evening.  The patient has elected to try the Dexilant in the evening to see if her symptoms are better controlled with once a day Dexilant.  The patient will also be set up for an upper endoscopy to rule out Barrett's esophagus.  The patient has been given an option to be evaluated for antireflux surgery if her symptoms persist.  The patient has been explained the plan and agrees with it.  [name], MD. Clementeen Graham    Note: This dictation was prepared with Dragon dictation along with smaller phrase technology. Any transcriptional errors that result from this process are unintentional.

## 2018-04-06 ENCOUNTER — Other Ambulatory Visit: Payer: Self-pay

## 2018-04-06 ENCOUNTER — Encounter: Payer: Self-pay | Admitting: *Deleted

## 2018-04-11 NOTE — Discharge Instructions (Signed)
General Anesthesia, Adult, Care After  This sheet gives you information about how to care for yourself after your procedure. Your health care provider may also give you more specific instructions. If you have problems or questions, contact your health care provider.  What can I expect after the procedure?  After the procedure, the following side effects are common:  Pain or discomfort at the IV site.  Nausea.  Vomiting.  Sore throat.  Trouble concentrating.  Feeling cold or chills.  Weak or tired.  Sleepiness and fatigue.  Soreness and body aches. These side effects can affect parts of the body that were not involved in surgery.  Follow these instructions at home:    For at least 24 hours after the procedure:  Have a responsible adult stay with you. It is important to have someone help care for you until you are awake and alert.  Rest as needed.  Do not:  Participate in activities in which you could fall or become injured.  Drive.  Use heavy machinery.  Drink alcohol.  Take sleeping pills or medicines that cause drowsiness.  Make important decisions or sign legal documents.  Take care of children on your own.  Eating and drinking  Follow any instructions from your health care provider about eating or drinking restrictions.  When you feel hungry, start by eating small amounts of foods that are soft and easy to digest (bland), such as toast. Gradually return to your regular diet.  Drink enough fluid to keep your urine pale yellow.  If you vomit, rehydrate by drinking water, juice, or clear broth.  General instructions  If you have sleep apnea, surgery and certain medicines can increase your risk for breathing problems. Follow instructions from your health care provider about wearing your sleep device:  Anytime you are sleeping, including during daytime naps.  While taking prescription pain medicines, sleeping medicines, or medicines that make you drowsy.  Return to your normal activities as told by your health care  provider. Ask your health care provider what activities are safe for you.  Take over-the-counter and prescription medicines only as told by your health care provider.  If you smoke, do not smoke without supervision.  Keep all follow-up visits as told by your health care provider. This is important.  Contact a health care provider if:  You have nausea or vomiting that does not get better with medicine.  You cannot eat or drink without vomiting.  You have pain that does not get better with medicine.  You are unable to pass urine.  You develop a skin rash.  You have a fever.  You have redness around your IV site that gets worse.  Get help right away if:  You have difficulty breathing.  You have chest pain.  You have blood in your urine or stool, or you vomit blood.  Summary  After the procedure, it is common to have a sore throat or nausea. It is also common to feel tired.  Have a responsible adult stay with you for the first 24 hours after general anesthesia. It is important to have someone help care for you until you are awake and alert.  When you feel hungry, start by eating small amounts of foods that are soft and easy to digest (bland), such as toast. Gradually return to your regular diet.  Drink enough fluid to keep your urine pale yellow.  Return to your normal activities as told by your health care provider. Ask your health care   provider what activities are safe for you.  This information is not intended to replace advice given to you by your health care provider. Make sure you discuss any questions you have with your health care provider.  Document Released: 07/13/2000 Document Revised: 11/20/2016 Document Reviewed: 11/20/2016  Elsevier Interactive Patient Education  2019 [employer]

## 2018-04-14 ENCOUNTER — Telehealth: Payer: Self-pay | Admitting: Gastroenterology

## 2018-04-14 NOTE — Telephone Encounter (Signed)
Pt left vm she was giving samples by Dr. Servando Snarewohl for Ant Acid she can not rememeber the name but they seem to be working would like to get it called in to Mail order please call pt

## 2018-04-15 ENCOUNTER — Other Ambulatory Visit: Payer: Self-pay

## 2018-04-15 MED ORDER — DEXLANSOPRAZOLE 60 MG PO CPDR: 60.0000 mg | DELAYED_RELEASE_CAPSULE | Freq: Every day | ORAL | 3 refills | Status: DC

## 2018-04-15 NOTE — Telephone Encounter (Signed)
Rx for Dexilant has been sent to pt's mail order pharmacy per her request.

## 2018-04-18 ENCOUNTER — Other Ambulatory Visit: Payer: Self-pay | Admitting: Internal Medicine

## 2018-04-18 ENCOUNTER — Ambulatory Visit: Payer: BLUE CROSS/BLUE SHIELD | Admitting: Anesthesiology

## 2018-04-18 ENCOUNTER — Ambulatory Visit
Admission: RE | Admit: 2018-04-18 | Discharge: 2018-04-18 | Disposition: A | Discharge status: Home / Self Care | Payer: BLUE CROSS/BLUE SHIELD | Attending: Gastroenterology | Admitting: Gastroenterology

## 2018-04-18 ENCOUNTER — Encounter
Admission: RE | Disposition: A | Discharge status: Home / Self Care | Payer: Self-pay | Source: Home / Self Care | Attending: Gastroenterology

## 2018-04-18 DIAGNOSIS — K222 Esophageal obstruction: Secondary | ICD-10-CM | POA: Diagnosis not present

## 2018-04-18 DIAGNOSIS — K449 Diaphragmatic hernia without obstruction or gangrene: Secondary | ICD-10-CM | POA: Insufficient documentation

## 2018-04-18 DIAGNOSIS — K228 Other specified diseases of esophagus: Secondary | ICD-10-CM | POA: Diagnosis not present

## 2018-04-18 DIAGNOSIS — I10 Essential (primary) hypertension: Secondary | ICD-10-CM | POA: Diagnosis not present

## 2018-04-18 DIAGNOSIS — Z7982 Long term (current) use of aspirin: Secondary | ICD-10-CM | POA: Insufficient documentation

## 2018-04-18 DIAGNOSIS — R1310 Dysphagia, unspecified: Secondary | ICD-10-CM

## 2018-04-18 DIAGNOSIS — Z79899 Other long term (current) drug therapy: Secondary | ICD-10-CM | POA: Insufficient documentation

## 2018-04-18 DIAGNOSIS — Z87891 Personal history of nicotine dependence: Secondary | ICD-10-CM | POA: Diagnosis not present

## 2018-04-18 DIAGNOSIS — K219 Gastro-esophageal reflux disease without esophagitis: Secondary | ICD-10-CM | POA: Diagnosis not present

## 2018-04-18 DIAGNOSIS — R1319 Other dysphagia: Secondary | ICD-10-CM | POA: Diagnosis not present

## 2018-04-18 HISTORY — PX: ESOPHAGOGASTRODUODENOSCOPY (EGD) WITH PROPOFOL: SHX5813

## 2018-04-18 HISTORY — DX: Torticollis: M43.6

## 2018-04-18 HISTORY — PX: BALLOON DILATION: SHX5330

## 2018-04-18 SURGERY — ESOPHAGOGASTRODUODENOSCOPY (EGD) WITH PROPOFOL
Anesthesia: General | Site: Throat

## 2018-04-18 MED ORDER — GLYCOPYRROLATE 0.2 MG/ML IJ SOLN
INTRAMUSCULAR | Status: DC | PRN
  Administered 2018-04-18: 0.2 mg via INTRAVENOUS

## 2018-04-18 MED ORDER — OXYCODONE HCL 5 MG PO TABS: 5.0000 mg | ORAL_TABLET | Freq: Once | ORAL | Status: DC | PRN

## 2018-04-18 MED ORDER — PROPOFOL 10 MG/ML IV BOLUS
INTRAVENOUS | Status: DC | PRN
  Administered 2018-04-18: 20 mg via INTRAVENOUS
  Administered 2018-04-18: 30 mg via INTRAVENOUS
  Administered 2018-04-18: 80 mg via INTRAVENOUS
  Administered 2018-04-18: 20 mg via INTRAVENOUS
  Administered 2018-04-18: 30 mg via INTRAVENOUS
  Administered 2018-04-18: 40 mg via INTRAVENOUS
  Administered 2018-04-18: 30 mg via INTRAVENOUS

## 2018-04-18 MED ORDER — OXYCODONE HCL 5 MG/5ML PO SOLN: 5.0000 mg | Freq: Once | ORAL | Status: DC | PRN

## 2018-04-18 MED ORDER — LACTATED RINGERS IV SOLN
INTRAVENOUS | Status: DC
  Administered 2018-04-18: 09:00:00 via INTRAVENOUS

## 2018-04-18 MED ORDER — LIDOCAINE HCL (CARDIAC) PF 100 MG/5ML IV SOSY
PREFILLED_SYRINGE | INTRAVENOUS | Status: DC | PRN
  Administered 2018-04-18: 40 mg via INTRAVENOUS

## 2018-04-18 MED ORDER — STERILE WATER FOR IRRIGATION IR SOLN
Status: DC | PRN
  Administered 2018-04-18: 10:00:00

## 2018-04-18 SURGICAL SUPPLY — 10 items
BALLN DILATOR 15-18 8 (BALLOONS) ×3
BALLOON DILATOR 15-18 8 (BALLOONS) ×2 IMPLANT
BLOCK BITE 60FR ADLT L/F GRN (MISCELLANEOUS) ×3 IMPLANT
CANISTER SUCT 1200ML W/VALVE (MISCELLANEOUS) ×3 IMPLANT
FORCEPS BIOP RAD 4 LRG CAP 4 (CUTTING FORCEPS) ×3 IMPLANT
GOWN CVR UNV OPN BCK APRN NK (MISCELLANEOUS) ×4 IMPLANT
GOWN ISOL THUMB LOOP REG UNIV (MISCELLANEOUS) ×2
KIT ENDO PROCEDURE OLY (KITS) ×3 IMPLANT
SYR INFLATION 60ML (SYRINGE) ×3 IMPLANT
WATER STERILE IRR 250ML POUR (IV SOLUTION) ×3 IMPLANT

## 2018-04-18 NOTE — H&P (Signed)
[name] Pecolia Marando, MD [facility] [address] [place], [place] [address] [phone] Fax : [phone]  Primary Care Physician:  Lorre MunroeBaity, Regina W, NP Primary Gastroenterologist:  Dr. Servando SnareWohl  Pre-Procedure History & Physical: HPI:  Morgan [name] is a 42 y.o. female is here for an endoscopy.   Past Medical History:  Diagnosis Date  . Complication of anesthesia    STATES TOLD REINTUBATION WITH HYSTERECTOMY  . Difficult intubation   . GERD (gastroesophageal reflux disease)   . Headache    MIGRAINES  . History of hiatal hernia   . Hypertension   . PONV (postoperative nausea and vomiting)   . Stiff neck    mild limitations of up/down mvmt s/p neck surgery    Past Surgical History:  Procedure Laterality Date  . ABDOMINAL HYSTERECTOMY    . NECK SURGERY     FUSION C5-C7  . THYROIDECTOMY    . TRACHELECTOMY N/A 10/15/2017   Procedure: TRACHELECTOMY;  Surgeon: Schermerhorn, Ihor Austinhomas J, MD;  Location: [facility] ORS;  Service: Gynecology;  Laterality: N/A;  . TUBAL LIGATION      Prior to Admission medications   Medication Sig Start Date End Date Taking? Authorizing Provider  Aspirin-Acetaminophen-Caffeine (GOODY HEADACHE PO) Take by mouth daily as needed.   Yes [provider]  benazepril-hydrochlorthiazide (LOTENSIN HCT) 10-12.5 MG tablet Take 1 tablet by mouth daily. 02/07/18  Yes Baity, Salvadore Oxfordegina W, NP  cyclobenzaprine (FLEXERIL) 10 MG tablet Take 1 tablet (10 mg total) by mouth 2 (two) times daily as needed for muscle spasms. 02/07/18  Yes Lorre MunroeBaity, Regina W, NP  dexlansoprazole (DEXILANT) 60 MG capsule Take 1 capsule (60 mg total) by mouth daily. 04/15/18  Yes Midge MiniumWohl, Aztlan Coll, MD  ibuprofen (ADVIL,MOTRIN) 600 MG tablet Take by mouth. 10/18/17  Yes [provider]  omeprazole (PRILOSEC) 20 MG capsule Take 1 capsule (20 mg total) by mouth 2 (two) times daily before a meal. 01/17/18  Yes Baity, Salvadore Oxfordegina W, NP  valACYclovir (VALTREX) 500 MG tablet Take 1 tablet (500 mg total) by  mouth daily. 02/07/18  Yes Lorre MunroeBaity, Regina W, NP    Allergies as of 04/04/2018 - Review Complete 04/04/2018  Allergen Reaction Noted  . Adhesive [tape] Other (See Comments) 09/24/2017  . Latex Other (See Comments) 09/24/2017  . Lexapro [escitalopram oxalate] Other (See Comments) 10/15/2016  . Metoprolol Other (See Comments) 10/29/2016  . Topamax [topiramate]  10/15/2016    Family History  Problem Relation Age of Onset  . Arthritis Mother   . Arthritis Father   . Stroke Father   . Hypertension Father   . Heart disease Maternal Uncle   . Arthritis Maternal Grandmother   . Heart disease Maternal Grandmother   . Hypertension Maternal Grandmother   . Arthritis Maternal Grandfather   . Lung cancer Maternal Grandfather   . Heart disease Maternal Grandfather   . Arthritis Paternal Grandmother   . Heart disease Paternal Grandmother   . Hypertension Paternal Grandmother   . Arthritis Paternal Grandfather   . Heart disease Paternal Grandfather   . Stroke Paternal Grandfather     Social History   Socioeconomic History  . Marital status: Married    Spouse name: Not on file  . Number of children: Not on file  . Years of education: Not on file  . Highest education level: Not on file  Occupational History  . Not on file  Social Needs  . Financial resource strain: Not on file  . Food insecurity:    Worry: Not on  file    Inability: Not on file  . Transportation needs:    Medical: Not on file    Non-medical: Not on file  Tobacco Use  . Smoking status: Former Smoker    Last attempt to quit: 09/30/2016    Years since quitting: 1.5  . Smokeless tobacco: Never Used  Substance and Sexual Activity  . Alcohol use: Not Currently    Comment: occasional  . Drug use: No  . Sexual activity: Yes  Lifestyle  . Physical activity:    Days per week: Not on file    Minutes per session: Not on file  . Stress: Not on file  Relationships  . Social connections:    Talks on phone: Not on file      Gets together: Not on file    Attends religious service: Not on file    Active member of club or organization: Not on file    Attends meetings of clubs or organizations: Not on file    Relationship status: Not on file  . Intimate partner violence:    Fear of current or ex partner: Not on file    Emotionally abused: Not on file    Physically abused: Not on file    Forced sexual activity: Not on file  Other Topics Concern  . Not on file  Social History Narrative  . Not on file    Review of Systems: See HPI, otherwise negative ROS  Physical Exam: BP 123/79   Pulse (!) 104   Temp 99 F (37.2 C) (Temporal)   Resp 16   Ht 5\' 5"  (1.651 m)   Wt 84.4 kg   SpO2 100%   BMI 30.95 kg/m  General:   Alert,  pleasant and cooperative in NAD Head:  Normocephalic and atraumatic. Neck:  Supple; no masses or thyromegaly. Lungs:  Clear throughout to auscultation.    Heart:  Regular rate and rhythm. Abdomen:  Soft, nontender and nondistended. Normal bowel sounds, without guarding, and without rebound.   Neurologic:  Alert and  oriented x4;  grossly normal neurologically.  Impression/Plan: Morgan [name] is here for an endoscopy to be performed for dysphagia  Risks, benefits, limitations, and alternatives regarding  endoscopy have been reviewed with the patient.  Questions have been answered.  All parties agreeable.   [name] Demetrice Combes, MD  04/18/2018, 8:56 AM

## 2018-04-18 NOTE — Transfer of Care (Signed)
Immediate Anesthesia Transfer of Care Note  Patient: Morgan [name]  Procedure(s) Performed: ESOPHAGOGASTRODUODENOSCOPY (EGD) WITH BIOPSIES (N/A Throat) BALLOON DILATION (N/A Esophagus)  Patient Location: PACU  Anesthesia Type: General  Level of Consciousness: awake, alert  and patient cooperative  Airway and Oxygen Therapy: Patient Spontanous Breathing and Patient connected to supplemental oxygen  Post-op Assessment: Post-op Vital signs reviewed, Patient's Cardiovascular Status Stable, Respiratory Function Stable, Patent Airway and No signs of Nausea or vomiting  Post-op Vital Signs: Reviewed and stable  Complications: No apparent anesthesia complications

## 2018-04-18 NOTE — Anesthesia Preprocedure Evaluation (Signed)
Anesthesia Evaluation  Patient identified by MRN, date of birth, ID band Patient awake    Reviewed: Allergy & Precautions, NPO status , Patient's Chart, lab work & pertinent test results  History of Anesthesia Complications (+) PONV, DIFFICULT AIRWAY and history of anesthetic complications (pt unaware of 'difficult intubation')  Airway Mallampati: II  TM Distance: >3 FB Neck ROM: full    Dental no notable dental hx.    Pulmonary neg pulmonary ROS, former smoker,    Pulmonary exam normal        Cardiovascular Exercise Tolerance: Good hypertension, Normal cardiovascular exam     Neuro/Psych  Headaches, negative psych ROS   GI/Hepatic Neg liver ROS, GERD  Controlled,  Endo/Other  negative endocrine ROS  Renal/GU negative Renal ROS  negative genitourinary   Musculoskeletal negative musculoskeletal ROS (+) acdf c5-c7   Abdominal   Peds negative pediatric ROS (+)  Hematology negative hematology ROS (+)   Anesthesia Other Findings   Reproductive/Obstetrics negative OB ROS                             Anesthesia Physical Anesthesia Plan  ASA: II  Anesthesia Plan: General   Post-op Pain Management:    Induction:   PONV Risk Score and Plan:   Airway Management Planned: Natural Airway  Additional Equipment:   Intra-op Plan:   Post-operative Plan:   Informed Consent: I have reviewed the patients History and Physical, chart, labs and discussed the procedure including the risks, benefits and alternatives for the proposed anesthesia with the patient or authorized representative who has indicated his/her understanding and acceptance.     Plan Discussed with: CRNA  Anesthesia Plan Comments:         Anesthesia Quick Evaluation

## 2018-04-18 NOTE — Anesthesia Procedure Notes (Signed)
Performed by: Hipolito Martinezlopez, CRNA Pre-anesthesia Checklist: Patient identified, Emergency Drugs available, Suction available, Timeout performed and Patient being monitored Patient Re-evaluated:Patient Re-evaluated prior to induction Oxygen Delivery Method: Nasal cannula Placement Confirmation: positive ETCO2       

## 2018-04-18 NOTE — Op Note (Signed)
[facility] Gastroenterology Patient Name: Morgan [name] Procedure Date: 04/18/2018 9:26 AM MRN: [id number] Account #: [id number] Date of Birth: 12/24/1975 Admit Type: Outpatient Age: 42 Room: [facility] [address] OR ROOM 01 Gender: Female Note Status: Finalized Procedure:            Upper GI endoscopy Indications:          Dysphagia Providers:            Morgan Minium MD, Morgan [name] Referring Morgan [name]:         Lorre Munroe (Referring Morgan [name]) Medicines:            Propofol per Anesthesia Complications:        No immediate complications. Procedure:            Pre-Anesthesia Assessment:                       - Prior to the procedure, a History and Physical was                        performed, and patient medications and allergies were                        reviewed. The patient's tolerance of previous                        anesthesia was also reviewed. The risks and benefits of                        the procedure and the sedation options and risks were                        discussed with the patient. All questions were                        answered, and informed consent was obtained. Prior                        Anticoagulants: The patient has taken no previous                        anticoagulant or antiplatelet agents. ASA Grade                        Assessment: II - A patient with mild systemic disease.                        After reviewing the risks and benefits, the patient was                        deemed in satisfactory condition to undergo the                        procedure.                       After obtaining informed consent, the endoscope was                        passed under direct vision. Throughout the procedure,  the patient's blood pressure, pulse, and oxygen                        saturations were monitored continuously. The Endoscope                        was introduced through the mouth, and advanced to the                        second  part of duodenum. The upper GI endoscopy was                        accomplished without difficulty. The patient tolerated                        the procedure well. Findings:      The lower third of the esophagus was tortuous.      One benign-appearing, intrinsic mild stenosis was found at the       gastroesophageal junction. The stenosis was traversed. A TTS dilator was       passed through the scope. Dilation with a 15-16.5-18 mm balloon dilator       was performed. The dilation site was examined following endoscope       reinsertion and showed complete resolution of luminal narrowing.      A large hiatal hernia was present.      Two biopsies were obtained with cold forceps for histology in the middle       third of the esophagus.      The stomach was normal.      The examined duodenum was normal. Impression:           - Tortuous esophagus.                       - Benign-appearing esophageal stenosis. Dilated.                       - Large hiatal hernia.                       - Normal stomach.                       - Normal examined duodenum.                       - Biopsy performed in the middle third of the esophagus. Recommendation:       - Discharge patient to home.                       - Resume previous diet.                       - Continue present medications.                       - Await pathology results. Procedure Code(s):    --- Professional ---                       [phone], Esophagogastroduodenoscopy, flexible, transoral;  with transendoscopic balloon dilation of esophagus                        (less than 30 mm diameter)                       43239, 59, Esophagogastroduodenoscopy, flexible,                        transoral; with biopsy, single or multiple Diagnosis Code(s):    --- Professional ---                       R13.10, Dysphagia, unspecified                       K22.2, Esophageal obstruction CPT copyright 2018 American Medical  Association. All rights reserved. The codes documented in this report are preliminary and upon coder review may  be revised to meet current compliance requirements. Morgan Miniumarren Marqueze Ramcharan MD, Morgan [name] 04/18/2018 9:43:49 AM This report has been signed electronically. Number of Addenda: 0 Note Initiated On: 04/18/2018 9:26 AM Total Procedure Duration: 0 hours 6 minutes 5 seconds       [facility]

## 2018-04-18 NOTE — Anesthesia Postprocedure Evaluation (Signed)
Anesthesia Post Note  Patient: Morgan [name]  Procedure(s) Performed: ESOPHAGOGASTRODUODENOSCOPY (EGD) WITH BIOPSIES (N/A Throat) BALLOON DILATION (N/A Esophagus)  Patient location during evaluation: PACU Anesthesia Type: General Level of consciousness: awake and alert Pain management: pain level controlled Vital Signs Assessment: post-procedure vital signs reviewed and stable Respiratory status: spontaneous breathing, nonlabored ventilation, respiratory function stable and patient connected to nasal cannula oxygen Cardiovascular status: blood pressure returned to baseline and stable Postop Assessment: no apparent nausea or vomiting Anesthetic complications: no    Deliliah Spranger

## 2018-04-19 ENCOUNTER — Encounter: Payer: Self-pay | Admitting: Gastroenterology

## 2018-04-21 ENCOUNTER — Encounter: Payer: Self-pay | Admitting: Gastroenterology

## 2018-04-22 ENCOUNTER — Telehealth: Payer: Self-pay

## 2018-04-22 NOTE — Telephone Encounter (Signed)
Pt.notified

## 2018-04-22 NOTE — Telephone Encounter (Signed)
-----   Message from [name], MD sent at 04/21/2018 12:33 PM EST ----- Hi,  Listing and pushing should not have anything to do with her hiatal hernia.  She should continue her present medications. ----- Message ----- From: Rayann Heman, CMA Sent: 04/21/2018   9:45 AM EST To: [name], MD  Pt wanted to know if we needed to put a lifting, pulling restriction on her due to the large hiatal hernia that was noted on her recent EGD. She stated she cannot go through another surgery right now as she can't be out of work. She said she is not having any issues. She is concerned with the lifting restrictions. Please advise.

## 2018-05-03 ENCOUNTER — Other Ambulatory Visit: Payer: Self-pay

## 2018-05-03 ENCOUNTER — Encounter: Payer: Self-pay | Admitting: Internal Medicine

## 2018-05-03 MED ORDER — OMEPRAZOLE 40 MG PO CPDR: 40.0000 mg | DELAYED_RELEASE_CAPSULE | Freq: Two times a day (BID) | ORAL | 1 refills | Status: DC

## 2018-05-04 MED ORDER — CYCLOBENZAPRINE HCL 10 MG PO TABS: 10.0000 mg | ORAL_TABLET | Freq: Two times a day (BID) | ORAL | 0 refills | Status: DC | PRN

## 2018-05-04 NOTE — Telephone Encounter (Signed)
Last filled 02/07/2018... please advise

## 2018-05-07 ENCOUNTER — Encounter: Payer: Self-pay | Admitting: Internal Medicine

## 2018-05-08 ENCOUNTER — Emergency Department
Admission: EM | Admit: 2018-05-08 | Discharge: 2018-05-08 | Disposition: A | Discharge status: Home / Self Care | Payer: BLUE CROSS/BLUE SHIELD | Attending: Emergency Medicine | Admitting: Emergency Medicine

## 2018-05-08 ENCOUNTER — Emergency Department: Payer: BLUE CROSS/BLUE SHIELD

## 2018-05-08 ENCOUNTER — Encounter: Payer: Self-pay | Admitting: Medical Oncology

## 2018-05-08 DIAGNOSIS — R202 Paresthesia of skin: Secondary | ICD-10-CM | POA: Insufficient documentation

## 2018-05-08 DIAGNOSIS — Z87891 Personal history of nicotine dependence: Secondary | ICD-10-CM | POA: Insufficient documentation

## 2018-05-08 DIAGNOSIS — R200 Anesthesia of skin: Secondary | ICD-10-CM | POA: Insufficient documentation

## 2018-05-08 DIAGNOSIS — M25552 Pain in left hip: Secondary | ICD-10-CM | POA: Diagnosis not present

## 2018-05-08 DIAGNOSIS — M549 Dorsalgia, unspecified: Secondary | ICD-10-CM | POA: Diagnosis not present

## 2018-05-08 DIAGNOSIS — Z79899 Other long term (current) drug therapy: Secondary | ICD-10-CM | POA: Insufficient documentation

## 2018-05-08 DIAGNOSIS — Z91040 Latex allergy status: Secondary | ICD-10-CM | POA: Diagnosis not present

## 2018-05-08 DIAGNOSIS — I10 Essential (primary) hypertension: Secondary | ICD-10-CM | POA: Insufficient documentation

## 2018-05-08 MED ORDER — METAXALONE 800 MG PO TABS: 800.0000 mg | ORAL_TABLET | Freq: Three times a day (TID) | ORAL | 0 refills | Status: DC

## 2018-05-08 MED ORDER — OXYCODONE-ACETAMINOPHEN 5-325 MG PO TABS
1.0000 | ORAL_TABLET | Freq: Once | ORAL | Status: AC
  Administered 2018-05-08: 1 via ORAL
  Filled 2018-05-08: qty 1

## 2018-05-08 MED ORDER — PREDNISONE 20 MG PO TABS
60.0000 mg | ORAL_TABLET | Freq: Once | ORAL | Status: AC
  Administered 2018-05-08: 60 mg via ORAL
  Filled 2018-05-08: qty 3

## 2018-05-08 MED ORDER — KETOROLAC TROMETHAMINE 30 MG/ML IJ SOLN
30.0000 mg | Freq: Once | INTRAMUSCULAR | Status: AC
  Administered 2018-05-08: 30 mg via INTRAMUSCULAR
  Filled 2018-05-08: qty 1

## 2018-05-08 MED ORDER — LIDOCAINE 5 % EX PTCH
1.0000 | MEDICATED_PATCH | CUTANEOUS | Status: DC
  Administered 2018-05-08: 1 via TRANSDERMAL
  Filled 2018-05-08: qty 1

## 2018-05-08 MED ORDER — ORPHENADRINE CITRATE 30 MG/ML IJ SOLN
60.0000 mg | Freq: Two times a day (BID) | INTRAMUSCULAR | Status: DC
  Administered 2018-05-08: 60 mg via INTRAMUSCULAR
  Filled 2018-05-08: qty 2

## 2018-05-08 MED ORDER — PREDNISONE 10 MG PO TABS: ORAL_TABLET | ORAL | 0 refills | Status: DC

## 2018-05-08 MED ORDER — LIDOCAINE 5 % EX PTCH: 1.0000 | MEDICATED_PATCH | CUTANEOUS | 0 refills | Status: DC

## 2018-05-08 NOTE — ED Notes (Signed)
Returned from XR 

## 2018-05-08 NOTE — ED Notes (Signed)
Patient transported to X-ray 

## 2018-05-08 NOTE — ED Triage Notes (Signed)
Pt reports left sided sciatica pain that began last night. Hx of same.

## 2018-05-08 NOTE — ED Provider Notes (Signed)
[facility] Emergency Department Provider Note  ____________________________________________  Time seen: Approximately 9:38 AM  I have reviewed the triage vital signs and the nursing notes.   HISTORY  Chief Complaint Back Pain and Hip Pain    HPI Morgan [name] is a 43 y.o. female presents to emergency department for evaluation of left hip and back pain for 1 day.  Patient states that a couple of months ago, she felt a pop in her left hip.  She has had on and off sciatica since.  Patient states that she felt a pop while rolling over in bed last night and has had pain with moving left leg since.  She has shooting pains down her left leg and some numbness and tingling to the left leg.  She saw orthopedics several years ago for sciatica and was discussed to "kill the nerve"but patient elected not to go through with this.  She takes Flexeril daily for chronic neck pain.  No bowel or bladder dysfunction or saddle anesthesias.   Past Medical History:  Diagnosis Date  . Complication of anesthesia    STATES TOLD REINTUBATION WITH HYSTERECTOMY  . Difficult intubation   . GERD (gastroesophageal reflux disease)   . Headache    MIGRAINES  . History of hiatal hernia   . Hypertension   . PONV (postoperative nausea and vomiting)   . Stiff neck    mild limitations of up/down mvmt s/p neck surgery    Patient Active Problem List   Diagnosis Date Noted  . Problems with swallowing and mastication   . Stricture and stenosis of esophagus   . OAB (overactive bladder) 01/17/2018  . Chronic neck pain 12/13/2017  . HTN (hypertension) 10/29/2016  . GERD (gastroesophageal reflux disease) 10/29/2016    Past Surgical History:  Procedure Laterality Date  . ABDOMINAL HYSTERECTOMY    . BALLOON DILATION N/A 04/18/2018   Procedure: BALLOON DILATION;  Surgeon: Midge MiniumWohl, Darren, MD;  [facility];  Service: Endoscopy;  Laterality: N/A;  . ESOPHAGOGASTRODUODENOSCOPY  (EGD) WITH PROPOFOL N/A 04/18/2018   Procedure: ESOPHAGOGASTRODUODENOSCOPY (EGD) WITH BIOPSIES;  Surgeon: Midge MiniumWohl, Darren, MD;  [facility];  Service: Endoscopy;  Laterality: N/A;  . NECK SURGERY     FUSION C5-C7  . THYROIDECTOMY    . TRACHELECTOMY N/A 10/15/2017   Procedure: TRACHELECTOMY;  Surgeon: Schermerhorn, Ihor Austinhomas J, MD;  Location: [facility];  Service: Gynecology;  Laterality: N/A;  . TUBAL LIGATION      Prior to Admission medications   Medication Sig Start Date End Date Taking? Authorizing Provider  Aspirin-Acetaminophen-Caffeine (GOODY HEADACHE PO) Take by mouth daily as needed.    [provider]  benazepril-hydrochlorthiazide (LOTENSIN HCT) 10-12.5 MG tablet Take 1 tablet by mouth daily. 02/07/18   Lorre MunroeBaity, Regina W, NP  cyclobenzaprine (FLEXERIL) 10 MG tablet Take 1 tablet (10 mg total) by mouth 2 (two) times daily as needed for muscle spasms. 05/04/18   Lorre MunroeBaity, Regina W, NP  dexlansoprazole (DEXILANT) 60 MG capsule Take 1 capsule (60 mg total) by mouth daily. 04/15/18   Midge MiniumWohl, Darren, MD  ibuprofen (ADVIL,MOTRIN) 600 MG tablet Take by mouth. 10/18/17   [provider]  lidocaine (LIDODERM) 5 % Place 1 patch onto the skin daily. Remove & Discard patch within 12 hours or as directed by MD 05/08/18   Enid DerryWagner, Paulette Lynch, PA-C  metaxalone (SKELAXIN) 800 MG tablet Take 1 tablet (800 mg total) by mouth 3 (three) times daily. 05/08/18   Enid DerryWagner, Etienne Millward, PA-C  omeprazole (PRILOSEC)  40 MG capsule Take 1 capsule (40 mg total) by mouth 2 (two) times daily. 05/03/18   Midge MiniumWohl, Darren, MD  predniSONE (DELTASONE) 10 MG tablet Take 6 tablets on day 1, take 5 tablets on day 2, take 4 tablets on day 3, take 3 tablets on day 4, take 2 tablets on day 5, take 1 tablet on day 6 05/08/18   Enid DerryWagner, Cassondra Stachowski, PA-C  valACYclovir (VALTREX) 500 MG tablet Take 1 tablet (500 mg total) by mouth daily. 02/07/18   Lorre MunroeBaity, Regina W, NP    Allergies Adhesive [tape]; Latex; Lexapro [escitalopram oxalate];  Metoprolol; and Topamax [topiramate]  Family History  Problem Relation Age of Onset  . Arthritis Mother   . Arthritis Father   . Stroke Father   . Hypertension Father   . Heart disease Maternal Uncle   . Arthritis Maternal Grandmother   . Heart disease Maternal Grandmother   . Hypertension Maternal Grandmother   . Arthritis Maternal Grandfather   . Lung cancer Maternal Grandfather   . Heart disease Maternal Grandfather   . Arthritis Paternal Grandmother   . Heart disease Paternal Grandmother   . Hypertension Paternal Grandmother   . Arthritis Paternal Grandfather   . Heart disease Paternal Grandfather   . Stroke Paternal Grandfather     Social History Social History   Tobacco Use  . Smoking status: Former Smoker    Last attempt to quit: 09/30/2016    Years since quitting: 1.6  . Smokeless tobacco: Never Used  Substance Use Topics  . Alcohol use: Not Currently    Comment: occasional  . Drug use: No     Review of Systems  Constitutional: No fever/chills ENT: No upper respiratory complaints. Cardiovascular: No chest pain. Respiratory: No cough. No SOB. Gastrointestinal: No abdominal pain.  No nausea, no vomiting.  Musculoskeletal: Positive for hip pain. Skin: Negative for rash, abrasions, lacerations, ecchymosis. Neurological: Negative for headaches   ____________________________________________   PHYSICAL EXAM:  VITAL SIGNS: ED Triage Vitals  Enc Vitals Group     BP 05/08/18 0830 124/81     Pulse Rate 05/08/18 0830 (!) 113     Resp 05/08/18 0830 18     Temp 05/08/18 0830 98.3 F (36.8 C)     Temp Source 05/08/18 0830 Oral     SpO2 05/08/18 0830 100 %     Weight 05/08/18 0827 185 lb 3 oz (84 kg)     Height 05/08/18 0831 5\' 6"  (1.676 m)     Head Circumference --      Peak Flow --      Pain Score 05/08/18 0827 10     Pain Loc --      Pain Edu? --      Excl. in GC? --      Constitutional: Alert and oriented. Well appearing and in no acute  distress. Eyes: Conjunctivae are normal. PERRL. EOMI. Head: Atraumatic. ENT:      Ears:      Nose: No congestion/rhinnorhea.      Mouth/Throat: Mucous membranes are moist.  Neck: No stridor.   Cardiovascular: Normal rate, regular rhythm.  Good peripheral circulation. Respiratory: Normal respiratory effort without tachypnea or retractions. Lungs CTAB. Good air entry to the bases with no decreased or absent breath sounds. Gastrointestinal: Bowel sounds 4 quadrants. Soft and nontender to palpation. No guarding or rigidity. No palpable masses. No distention. Musculoskeletal: Full range of motion to all extremities. No gross deformities appreciated. Tenderness to palpation to left hip, superior to  trochanteric bursa. Pain with ROM of left hip. Able to use legs to push self up in bed. Neurologic:  Normal speech and language. No gross focal neurologic deficits are appreciated.  Skin:  Skin is warm, dry and intact. No rash noted. Psychiatric: Mood and affect are normal. Speech and behavior are normal. Patient exhibits appropriate insight and judgement.   ____________________________________________   LABS (all labs ordered are listed, but only abnormal results are displayed)  Labs Reviewed - No data to display ____________________________________________  EKG   ____________________________________________  RADIOLOGY Lexine Baton, personally viewed and evaluated these images (plain radiographs) as part of my medical decision making, as well as reviewing the written report by the radiologist.  Dg Hip Unilat W Or Wo Pelvis 2-3 Views Left  Result Date: 05/08/2018 CLINICAL DATA:  Left sciatic pain.  Hip pain. EXAM: DG HIP (WITH OR WITHOUT PELVIS) 2-3V LEFT COMPARISON:  None. FINDINGS: Pelvic bony ring is intact. Left hip is located without a fracture. Subchondral sclerosis along the superolateral aspect of the acetabula bilaterally. No significant joint space narrowing in the hips.  IMPRESSION: No acute abnormality in the pelvis or left hip. Electronically Signed   By: Richarda Overlie M.D.   On: 05/08/2018 10:06    ____________________________________________    PROCEDURES  Procedure(s) performed:    Procedures    Medications  orphenadrine (NORFLEX) injection 60 mg (60 mg Intramuscular Given 05/08/18 0958)  lidocaine (LIDODERM) 5 % 1 patch (1 patch Transdermal Patch Applied 05/08/18 0958)  ketorolac (TORADOL) 30 MG/ML injection 30 mg (30 mg Intramuscular Given 05/08/18 0958)  oxyCODONE-acetaminophen (PERCOCET/ROXICET) 5-325 MG per tablet 1 tablet (1 tablet Oral Given 05/08/18 0955)  predniSONE (DELTASONE) tablet 60 mg (60 mg Oral Given 05/08/18 1043)     ____________________________________________   INITIAL IMPRESSION / ASSESSMENT AND PLAN / ED COURSE  Pertinent labs & imaging results that were available during my care of the patient were reviewed by me and considered in my medical decision making (see chart for details).  Review of the [place] CSRS was performed in accordance of the [facility] prior to dispensing any controlled drugs.     Patient's diagnosis is consistent with left hip pain with radiculopathy.  Vital signs and exam are reassuring.  X-ray consistent with osteoarthritis.  Pain improved with Percocet, Toradol, Norflex.  Patient will be discharged home with prescriptions for prednisone, Skelaxin, Lidoderm.  Patient will try taking Skelaxin instead of Flexeril for comparison.  Patient is to follow up with primary care and spine ortho as directed. Patient is given ED precautions to return to the ED for any worsening or new symptoms.     ____________________________________________  FINAL CLINICAL IMPRESSION(S) / ED DIAGNOSES  Final diagnoses:  Left hip pain      NEW MEDICATIONS STARTED DURING THIS VISIT:  ED Discharge Orders         Ordered    predniSONE (DELTASONE) 10 MG tablet     05/08/18 1034    metaxalone (SKELAXIN) 800 MG tablet  3 times  daily     05/08/18 1034    lidocaine (LIDODERM) 5 %  Every 24 hours     05/08/18 1034              This chart was dictated using voice recognition software/Dragon. Despite best efforts to proofread, errors can occur which can change the meaning. Any change was purely unintentional.    Enid Derry, PA-C 05/08/18 1208    Sharyn Creamer, MD 05/08/18 1616

## 2018-05-08 NOTE — ED Notes (Signed)
ED Provider at bedside. 

## 2018-05-23 DIAGNOSIS — M5432 Sciatica, left side: Secondary | ICD-10-CM | POA: Diagnosis not present

## 2018-05-23 DIAGNOSIS — M5442 Lumbago with sciatica, left side: Secondary | ICD-10-CM | POA: Diagnosis not present

## 2018-06-22 ENCOUNTER — Encounter: Payer: Self-pay | Admitting: Internal Medicine

## 2018-06-28 DIAGNOSIS — M5416 Radiculopathy, lumbar region: Secondary | ICD-10-CM | POA: Diagnosis not present

## 2018-07-11 DIAGNOSIS — M5416 Radiculopathy, lumbar region: Secondary | ICD-10-CM | POA: Diagnosis not present

## 2018-07-20 ENCOUNTER — Other Ambulatory Visit: Payer: Self-pay | Admitting: Internal Medicine

## 2018-07-22 NOTE — Telephone Encounter (Signed)
Last filled 05/04/2018... please advise--- CPE 12/2017... CPE schedule 12/2018

## 2018-09-01 DIAGNOSIS — M5416 Radiculopathy, lumbar region: Secondary | ICD-10-CM | POA: Diagnosis not present

## 2018-09-05 ENCOUNTER — Other Ambulatory Visit: Payer: Self-pay | Admitting: Internal Medicine

## 2018-09-11 DIAGNOSIS — J029 Acute pharyngitis, unspecified: Secondary | ICD-10-CM | POA: Diagnosis not present

## 2018-10-10 ENCOUNTER — Other Ambulatory Visit: Payer: Self-pay | Admitting: Obstetrics and Gynecology

## 2018-10-10 DIAGNOSIS — Z01419 Encounter for gynecological examination (general) (routine) without abnormal findings: Secondary | ICD-10-CM | POA: Diagnosis not present

## 2018-10-10 DIAGNOSIS — Z1331 Encounter for screening for depression: Secondary | ICD-10-CM | POA: Diagnosis not present

## 2018-10-10 DIAGNOSIS — Z1231 Encounter for screening mammogram for malignant neoplasm of breast: Secondary | ICD-10-CM

## 2018-10-13 ENCOUNTER — Other Ambulatory Visit: Payer: Self-pay | Admitting: Gastroenterology

## 2018-11-05 ENCOUNTER — Other Ambulatory Visit: Payer: Self-pay | Admitting: Internal Medicine

## 2018-11-15 ENCOUNTER — Ambulatory Visit
Admission: RE | Admit: 2018-11-15 | Discharge: 2018-11-15 | Disposition: A | Discharge status: Home / Self Care | Payer: BC Managed Care – PPO | Source: Ambulatory Visit | Attending: Obstetrics and Gynecology | Admitting: Obstetrics and Gynecology

## 2018-11-15 DIAGNOSIS — Z1231 Encounter for screening mammogram for malignant neoplasm of breast: Secondary | ICD-10-CM | POA: Diagnosis not present

## 2018-11-29 ENCOUNTER — Other Ambulatory Visit: Payer: Self-pay | Admitting: Internal Medicine

## 2018-12-06 ENCOUNTER — Ambulatory Visit (INDEPENDENT_AMBULATORY_CARE_PROVIDER_SITE_OTHER): Payer: BC Managed Care – PPO | Admitting: Family Medicine

## 2018-12-06 ENCOUNTER — Other Ambulatory Visit: Payer: Self-pay

## 2018-12-06 ENCOUNTER — Encounter: Payer: Self-pay | Admitting: Family Medicine

## 2018-12-06 VITALS — BP 118/80 | HR 105 | Temp 98.6°F | Ht 65.0 in | Wt 186.3 lb

## 2018-12-06 DIAGNOSIS — N3000 Acute cystitis without hematuria: Secondary | ICD-10-CM | POA: Diagnosis not present

## 2018-12-06 DIAGNOSIS — R197 Diarrhea, unspecified: Secondary | ICD-10-CM | POA: Diagnosis not present

## 2018-12-06 DIAGNOSIS — R1030 Lower abdominal pain, unspecified: Secondary | ICD-10-CM

## 2018-12-06 DIAGNOSIS — R3915 Urgency of urination: Secondary | ICD-10-CM | POA: Diagnosis not present

## 2018-12-06 LAB — POC URINALSYSI DIPSTICK (AUTOMATED)
Bilirubin, UA: NEGATIVE
Blood, UA: NEGATIVE
Glucose, UA: NEGATIVE
Ketones, UA: NEGATIVE
Nitrite, UA: NEGATIVE
Protein, UA: NEGATIVE
Spec Grav, UA: 1.02 (ref 1.010–1.025)
Urobilinogen, UA: 0.2 E.U./dL
pH, UA: 6 (ref 5.0–8.0)

## 2018-12-06 MED ORDER — SULFAMETHOXAZOLE-TRIMETHOPRIM 800-160 MG PO TABS: 1.0000 | ORAL_TABLET | Freq: Two times a day (BID) | ORAL | 0 refills | Status: DC

## 2018-12-06 NOTE — Patient Instructions (Signed)
Start 3d bactrim course for possible UTI - culture sent.  Labs today to further evaluate abdominal discomfort with diarrhea.  Bland diet for next few days to give bowels a rest. Let us know if symptoms worsening.

## 2018-12-06 NOTE — Progress Notes (Signed)
This visit was conducted in person.  BP 118/80 (BP Location: Left Arm, Patient Position: Sitting, Cuff Size: Normal)   Pulse (!) 105   Temp 98.6 F (37 C) (Temporal)   Ht 5\' 5"  (1.651 m)   Wt 186 lb 5 oz (84.5 kg)   SpO2 97%   BMI 31.00 kg/m    CC: lower abd pain Subjective:    Patient ID: Morgan [name], female    DOB: 14-Nov-1975, 43 y.o.   MRN: [id number]  HPI: Morgan [name] is a 43 y.o. female presenting on 12/06/2018 for Urinary Urgency (C/o bladder feeling full but barely any urine.  Also, c/o urinary frequency and low abd pressure.  Sxs started last wk.  Tried AZO. ) and Diarrhea (C/o diarrhea and nausea.  Started yesterday. )   1 wk h/o urinary urgency, incomplete emptying, frequency. Nausea yesterday.  No fevers/chills, vomiting, flank pain, hematuria.  Yesterday started having bad diarrhea with blood and mucous x10-15, loose to watery stools. Lower abdominal pain as well.  Currently using valtrex 3x/wk to prevent fever blisters.   She did start Keifer milk 2 months ago as well as Environmental education officer for her IBS-C with benefit.   Treating with azo and cranberry juice over weekend.  Prior UTI led to hospitalization with pyelo.      Relevant past medical, surgical, family and social history reviewed and updated as indicated. Interim medical history since our last visit reviewed. Allergies and medications reviewed and updated. Outpatient Medications Prior to Visit  Medication Sig Dispense Refill  . Aspirin-Acetaminophen-Caffeine (GOODY HEADACHE PO) Take by mouth daily as needed.    . benazepril-hydrochlorthiazide (LOTENSIN HCT) 10-12.5 MG tablet Take 1 tablet by mouth daily. MUST SCHEDULE PHYSICAL EXAM 90 tablet 0  . cyclobenzaprine (FLEXERIL) 10 MG tablet TAKE 1 TABLET BY MOUTH TWO  TIMES DAILY AS NEEDED FOR  MUSCLE SPASM(S) 180 tablet 0  . omeprazole (PRILOSEC) 40 MG capsule TAKE 1 CAPSULE BY MOUTH TWO TIMES DAILY 180 capsule 1  . valACYclovir (VALTREX) 500 MG tablet Take 1  tablet (500 mg total) by mouth daily. SCHEDULE PHYSICAL EXAM FOR REFILLS 90 tablet 0  . dexlansoprazole (DEXILANT) 60 MG capsule Take 1 capsule (60 mg total) by mouth daily. 90 capsule 3  . ibuprofen (ADVIL,MOTRIN) 600 MG tablet Take by mouth.    . lidocaine (LIDODERM) 5 % Place 1 patch onto the skin daily. Remove & Discard patch within 12 hours or as directed by MD 30 patch 0  . metaxalone (SKELAXIN) 800 MG tablet Take 1 tablet (800 mg total) by mouth 3 (three) times daily. 30 tablet 0  . predniSONE (DELTASONE) 10 MG tablet Take 6 tablets on day 1, take 5 tablets on day 2, take 4 tablets on day 3, take 3 tablets on day 4, take 2 tablets on day 5, take 1 tablet on day 6 21 tablet 0   No facility-administered medications prior to visit.      Per HPI unless specifically indicated in ROS section below Review of Systems Objective:    BP 118/80 (BP Location: Left Arm, Patient Position: Sitting, Cuff Size: Normal)   Pulse (!) 105   Temp 98.6 F (37 C) (Temporal)   Ht 5\' 5"  (1.651 m)   Wt 186 lb 5 oz (84.5 kg)   SpO2 97%   BMI 31.00 kg/m   Wt Readings from Last 3 Encounters:  12/06/18 186 lb 5 oz (84.5 kg)  05/08/18 180 lb (81.6 kg)  04/18/18 186 lb (84.4  kg)    Physical Exam Vitals signs and nursing note reviewed.  Constitutional:      General: She is not in acute distress.    Appearance: Normal appearance. She is not ill-appearing.  HENT:     Mouth/Throat:     Mouth: Mucous membranes are moist.     Pharynx: No posterior oropharyngeal erythema.  Abdominal:     General: Abdomen is flat. Bowel sounds are normal. There is no distension.     Palpations: Abdomen is soft. There is no mass.     Tenderness: There is abdominal tenderness (moderate) in the suprapubic area. There is no right CVA tenderness, left CVA tenderness, guarding or rebound. Negative signs include Murphy's sign.     Hernia: No hernia is present.  Musculoskeletal:     Right lower leg: No edema.     Left lower leg: No  edema.  Neurological:     Mental Status: She is alert.  Psychiatric:        Mood and Affect: Mood normal.        Behavior: Behavior normal.       Results for orders placed or performed in visit on 12/06/18  POCT Urinalysis Dipstick (Automated)  Result Value Ref Range   Color, UA light yellow    Clarity, UA clear    Glucose, UA Negative Negative   Bilirubin, UA negative    Ketones, UA negative    Spec Grav, UA 1.020 1.010 - 1.025   Blood, UA negative    pH, UA 6.0 5.0 - 8.0   Protein, UA Negative Negative   Urobilinogen, UA 0.2 0.2 or 1.0 E.U./dL   Nitrite, UA negative    Leukocytes, UA Small (1+) (A) Negative   Assessment & Plan:   Problem List Items Addressed This Visit    Lower abdominal pain - Primary    With initial UTI symptoms - treat with 3d bactrim course while UCx returns. Given lower abdominal pain, check labs to eval for intra abdominal process/infection (CBC, CMP, lipase). With associated severe diarrhea (mucous/blood), will check GI pathogen panel to further evaluate for infectious cause. Update with effect.       Relevant Orders   Urine Culture   Comprehensive metabolic panel   CBC with Differential/Platelet   Lipase   Gastrointestinal Pathogen Panel PCR    Other Visit Diagnoses    Urinary urgency       Relevant Orders   POCT Urinalysis Dipstick (Automated) (Completed)   Urine Culture   Diarrhea, unspecified type       Relevant Orders   Gastrointestinal Pathogen Panel PCR       Meds ordered this encounter  Medications  . sulfamethoxazole-trimethoprim (BACTRIM DS) 800-160 MG tablet    Sig: Take 1 tablet by mouth 2 (two) times daily.    Dispense:  6 tablet    Refill:  0   Orders Placed This Encounter  Procedures  . Urine Culture  . Comprehensive metabolic panel  . CBC with Differential/Platelet  . Lipase  . Gastrointestinal Pathogen Panel PCR    Standing Status:   Future    Standing Expiration Date:   12/06/2019  . POCT Urinalysis Dipstick  (Automated)    Patient Instructions  Start 3d bactrim course for possible UTI - culture sent.  Labs today to further evaluate abdominal discomfort with diarrhea.  Bland diet for next few days to give bowels a rest. Let us know if symptoms worsening.    Follow up plan: No follow-ups  on file.  Ria Bush, MD

## 2018-12-07 DIAGNOSIS — R1031 Right lower quadrant pain: Secondary | ICD-10-CM | POA: Insufficient documentation

## 2018-12-07 DIAGNOSIS — R1030 Lower abdominal pain, unspecified: Secondary | ICD-10-CM | POA: Insufficient documentation

## 2018-12-07 LAB — COMPREHENSIVE METABOLIC PANEL WITH GFR
ALT: 14 U/L (ref 0–35)
AST: 18 U/L (ref 0–37)
Albumin: 4.6 g/dL (ref 3.5–5.2)
Alkaline Phosphatase: 93 U/L (ref 39–117)
BUN: 11 mg/dL (ref 6–23)
CO2: 30 meq/L (ref 19–32)
Calcium: 9.7 mg/dL (ref 8.4–10.5)
Chloride: 100 meq/L (ref 96–112)
Creatinine, Ser: 0.81 mg/dL (ref 0.40–1.20)
GFR: 77.78 mL/min (ref >60.00)
Glucose, Bld: 97 mg/dL (ref 70–99)
Potassium: 3.6 meq/L (ref 3.5–5.1)
Sodium: 141 meq/L (ref 135–145)
Total Bilirubin: 0.2 mg/dL (ref 0.2–1.2)
Total Protein: 7.8 g/dL (ref 6.0–8.3)

## 2018-12-07 LAB — CBC WITH DIFFERENTIAL/PLATELET
Basophils Absolute: 0.1 10*3/uL (ref 0.0–0.1)
Basophils Relative: 0.8 % (ref 0.0–3.0)
Eosinophils Absolute: 0.4 10*3/uL (ref 0.0–0.7)
Eosinophils Relative: 3.9 % (ref 0.0–5.0)
HCT: 37 % (ref 36.0–46.0)
Hemoglobin: 11.9 g/dL — ABNORMAL LOW (ref 12.0–15.0)
Lymphocytes Relative: 29 % (ref 12.0–46.0)
Lymphs Abs: 2.7 10*3/uL (ref 0.7–4.0)
MCHC: 32.1 g/dL (ref 30.0–36.0)
MCV: 78.1 fl (ref 78.0–100.0)
Monocytes Absolute: 0.8 10*3/uL (ref 0.1–1.0)
Monocytes Relative: 9 % (ref 3.0–12.0)
Neutro Abs: 5.4 10*3/uL (ref 1.4–7.7)
Neutrophils Relative %: 57.3 % (ref 43.0–77.0)
Platelets: 336 10*3/uL (ref 150.0–400.0)
RBC: 4.74 Mil/uL (ref 3.87–5.11)
RDW: 15.5 % (ref 11.5–15.5)
WBC: 9.4 10*3/uL (ref 4.0–10.5)

## 2018-12-07 LAB — LIPASE: Lipase: 18 U/L (ref 11.0–59.0)

## 2018-12-07 NOTE — Assessment & Plan Note (Signed)
With initial UTI symptoms - treat with 3d bactrim course while UCx returns. Given lower abdominal pain, check labs to eval for intra abdominal process/infection (CBC, CMP, lipase). With associated severe diarrhea (mucous/blood), will check GI pathogen panel to further evaluate for infectious cause. Update with effect.

## 2018-12-08 ENCOUNTER — Other Ambulatory Visit: Payer: Self-pay | Admitting: Family Medicine

## 2018-12-08 LAB — URINE CULTURE
MICRO NUMBER:: 783849
SPECIMEN QUALITY:: ADEQUATE

## 2018-12-08 MED ORDER — SULFAMETHOXAZOLE-TRIMETHOPRIM 800-160 MG PO TABS: 1.0000 | ORAL_TABLET | Freq: Two times a day (BID) | ORAL | 0 refills | Status: DC

## 2018-12-15 ENCOUNTER — Encounter: Payer: Self-pay | Admitting: Internal Medicine

## 2018-12-15 ENCOUNTER — Ambulatory Visit (INDEPENDENT_AMBULATORY_CARE_PROVIDER_SITE_OTHER): Payer: BC Managed Care – PPO | Admitting: Internal Medicine

## 2018-12-15 ENCOUNTER — Other Ambulatory Visit: Payer: Self-pay

## 2018-12-15 VITALS — BP 133/94 | HR 107 | Temp 98.9°F

## 2018-12-15 DIAGNOSIS — R05 Cough: Secondary | ICD-10-CM

## 2018-12-15 DIAGNOSIS — R059 Cough, unspecified: Secondary | ICD-10-CM

## 2018-12-15 DIAGNOSIS — R6889 Other general symptoms and signs: Secondary | ICD-10-CM | POA: Diagnosis not present

## 2018-12-15 DIAGNOSIS — Z20822 Contact with and (suspected) exposure to covid-19: Secondary | ICD-10-CM

## 2018-12-15 DIAGNOSIS — J3489 Other specified disorders of nose and nasal sinuses: Secondary | ICD-10-CM | POA: Diagnosis not present

## 2018-12-15 DIAGNOSIS — J029 Acute pharyngitis, unspecified: Secondary | ICD-10-CM

## 2018-12-15 DIAGNOSIS — R0602 Shortness of breath: Secondary | ICD-10-CM

## 2018-12-15 MED ORDER — ALBUTEROL SULFATE HFA 108 (90 BASE) MCG/ACT IN AERS: 2.0000 | INHALATION_SPRAY | Freq: Four times a day (QID) | RESPIRATORY_TRACT | 0 refills | Status: DC | PRN

## 2018-12-15 NOTE — Progress Notes (Signed)
Virtual Visit via Video Note  I connected with Morgan [name] on 12/15/18 at  9:00 AM EDT by a video enabled telemedicine application and verified that I am speaking with the correct person using two identifiers.  Location: Patient: Home Provider: Office   I discussed the limitations of evaluation and management by telemedicine and the availability of in person appointments. The patient expressed understanding and agreed to proceed.  History of Present Illness:  HPI  Pt presents to the clinic today with c/o runny nose, nasal congestion, sore throat and cough. This started 2 days ago. She is blowing clear mucous out of her nose. She denies difficulty swallowing, but reports the throat is more scratchy. The cough is non productive. She reports some mild shortness of breath and dizziness. She denies ear pain, loss of taste or smell. She denies fever, chills or body aches. She has take Goodies Powders, Alka Seltzer and ASA with minimal relief. She may have had exposure  To COVID 19, she is unsure.   Review of Systems      Past Medical History:  Diagnosis Date  . Complication of anesthesia    STATES TOLD REINTUBATION WITH HYSTERECTOMY  . Difficult intubation   . GERD (gastroesophageal reflux disease)   . Headache    MIGRAINES  . History of hiatal hernia   . Hypertension   . PONV (postoperative nausea and vomiting)   . Stiff neck    mild limitations of up/down mvmt s/p neck surgery    Family History  Problem Relation Age of Onset  . Arthritis Mother   . Arthritis Father   . Stroke Father   . Hypertension Father   . Heart disease Maternal Uncle   . Arthritis Maternal Grandmother   . Heart disease Maternal Grandmother   . Hypertension Maternal Grandmother   . Arthritis Maternal Grandfather   . Lung cancer Maternal Grandfather   . Heart disease Maternal Grandfather   . Arthritis Paternal Grandmother   . Heart disease Paternal Grandmother   . Hypertension Paternal Grandmother    . Arthritis Paternal Grandfather   . Heart disease Paternal Grandfather   . Stroke Paternal Grandfather     Social History   Socioeconomic History  . Marital status: Married    Spouse name: Not on file  . Number of children: Not on file  . Years of education: Not on file  . Highest education level: Not on file  Occupational History  . Not on file  Social Needs  . Financial resource strain: Not on file  . Food insecurity    Worry: Not on file    Inability: Not on file  . Transportation needs    Medical: Not on file    Non-medical: Not on file  Tobacco Use  . Smoking status: Former Smoker    Quit date: 09/30/2016    Years since quitting: 2.2  . Smokeless tobacco: Never Used  Substance and Sexual Activity  . Alcohol use: Not Currently    Comment: occasional  . Drug use: No  . Sexual activity: Yes  Lifestyle  . Physical activity    Days per week: Not on file    Minutes per session: Not on file  . Stress: Not on file  Relationships  . Social Musicianconnections    Talks on phone: Not on file    Gets together: Not on file    Attends religious service: Not on file    Active member of club or organization: Not on file  Attends meetings of clubs or organizations: Not on file    Relationship status: Not on file  . Intimate partner violence    Fear of current or ex partner: Not on file    Emotionally abused: Not on file    Physically abused: Not on file    Forced sexual activity: Not on file  Other Topics Concern  . Not on file  Social History Narrative  . Not on file    Allergies  Allergen Reactions  . Adhesive [Tape] Other (See Comments)    Bruises skin  . Latex Other (See Comments)    Swelling (vaginal)  . Lexapro [Escitalopram Oxalate] Other (See Comments)    Loss taste  . Metoprolol Other (See Comments)    Lowers heart rate  . Topamax [Topiramate]     Loss taste     Constitutional: Denies headache, fatigue, fever or abrupt weight changes.  HEENT:   Positive runny nose, nasal congestion, and sore throat. Denies eye redness, eye pain, pressure behind the eyes, facial pain, ear pain, ringing in the ears, wax buildup, runny nose or bloody nose. Respiratory: Positive cough,. Shortness of breath. Denies difficulty breathing.  Cardiovascular: Denies chest pain, chest tightness, palpitations or swelling in the hands or feet.  Neuro: Positive dizziness. Denies difficulty with speech, balance or coordination.  No other specific complaints in a complete review of systems (except as listed in HPI above).  Objective:   Wt Readings from Last 3 Encounters:  12/06/18 186 lb 5 oz (84.5 kg)  05/08/18 180 lb (81.6 kg)  04/18/18 186 lb (84.4 kg)     General: Appears her stated age, obese, in NAD. HEENT: Head: normal shape and size; Nose: sounds congested; Throat/Mouth: sounds hoarse. Cardiovascular: Tachycardic per her measurement Pulmonary/Chest: Normal effort. No respiratory distress. No wheezes, rales or ronchi noted.       Assessment & Plan:   Stuffy and Runny Nose, Sore Throat, Cough, SOB:  Get some rest and drink plenty of water Do salt water gargles for the sore throat Proceed to the [facility] building for Novel Coronavirus Testing Discussed symptomatic care- Zyrtec, Flonase, Tylenol and Delsym RX for Albuterol 1-2 puffs Q4 hours prn Discussed the importance of masking, quarantining, social distancing and hand washing ER precautions discussed  RTC as needed or if symptoms persist.   Follow Up Instructions:    I discussed the assessment and treatment plan with the patient. The patient was provided an opportunity to ask questions and all were answered. The patient agreed with the plan and demonstrated an understanding of the instructions.   The patient was advised to call back or seek an in-person evaluation if the symptoms worsen or if the condition fails to improve as anticipated.    Webb Silversmith, NP

## 2018-12-15 NOTE — Patient Instructions (Signed)
COVID-19: How to Protect Yourself and Others Know how it spreads  There is currently no vaccine to prevent coronavirus disease 2019 (COVID-19).  The best way to prevent illness is to avoid being exposed to this virus.  The virus is thought to spread mainly from person-to-person. ? Between people who are in close contact with one another (within about 6 feet). ? Through respiratory droplets produced when an infected person coughs, sneezes or talks. ? These droplets can land in the mouths or noses of people who are nearby or possibly be inhaled into the lungs. ? Some recent studies have suggested that COVID-19 may be spread by people who are not showing symptoms. Everyone should Clean your hands often  Wash your hands often with soap and water for at least 20 seconds especially after you have been in a public place, or after blowing your nose, coughing, or sneezing.  If soap and water are not readily available, use a hand sanitizer that contains at least 60% alcohol. Cover all surfaces of your hands and rub them together until they feel dry.  Avoid touching your eyes, nose, and mouth with unwashed hands. Avoid close contact  Stay home if you are sick.  Avoid close contact with people who are sick.  Put distance between yourself and other people. ? Remember that some people without symptoms may be able to spread virus. ? This is especially important for people who are at higher risk of getting very sick.www.cdc.gov/coronavirus/2019-ncov/need-extra-precautions/people-at-higher-risk.html Cover your mouth and nose with a cloth face cover when around others  You could spread COVID-19 to others even if you do not feel sick.  Everyone should wear a cloth face cover when they have to go out in public, for example to the grocery store or to pick up other necessities. ? Cloth face coverings should not be placed on young children under age 2, anyone who has trouble breathing, or is unconscious,  incapacitated or otherwise unable to remove the mask without assistance.  The cloth face cover is meant to protect other people in case you are infected.  Do NOT use a facemask meant for a healthcare worker.  Continue to keep about 6 feet between yourself and others. The cloth face cover is not a substitute for social distancing. Cover coughs and sneezes  If you are in a private setting and do not have on your cloth face covering, remember to always cover your mouth and nose with a tissue when you cough or sneeze or use the inside of your elbow.  Throw used tissues in the trash.  Immediately wash your hands with soap and water for at least 20 seconds. If soap and water are not readily available, clean your hands with a hand sanitizer that contains at least 60% alcohol. Clean and disinfect  Clean AND disinfect frequently touched surfaces daily. This includes tables, doorknobs, light switches, countertops, handles, desks, phones, keyboards, toilets, faucets, and sinks. www.cdc.gov/coronavirus/2019-ncov/prevent-getting-sick/disinfecting-your-home.html  If surfaces are dirty, clean them: Use detergent or soap and water prior to disinfection.  Then, use a household disinfectant. You can see a list of EPA-registered household disinfectants here. cdc.gov/coronavirus 08/23/2018 This information is not intended to replace advice given to you by your health care provider. Make sure you discuss any questions you have with your health care provider. Document Released: 08/02/2018 Document Revised: 08/31/2018 Document Reviewed: 08/02/2018 Elsevier Patient Education  2020 Elsevier Inc.  

## 2018-12-17 LAB — NOVEL CORONAVIRUS, NAA: SARS-CoV-2, NAA: NOT DETECTED

## 2018-12-30 ENCOUNTER — Encounter: Payer: Self-pay | Admitting: Internal Medicine

## 2018-12-30 MED ORDER — CYCLOBENZAPRINE HCL 10 MG PO TABS: ORAL_TABLET | ORAL | 0 refills | Status: DC

## 2018-12-30 NOTE — Telephone Encounter (Signed)
Last office visit 12/15/2018 for Cough/Nasal Congestion.  Last refilled 07/22/2018 for 180 with no refills.  Rollene Fare is out of office today.  No future appointments.

## 2019-01-06 ENCOUNTER — Other Ambulatory Visit: Payer: Self-pay | Admitting: Internal Medicine

## 2019-01-30 ENCOUNTER — Other Ambulatory Visit: Payer: Self-pay | Admitting: Internal Medicine

## 2019-02-12 ENCOUNTER — Other Ambulatory Visit: Payer: Self-pay | Admitting: Internal Medicine

## 2019-02-18 ENCOUNTER — Other Ambulatory Visit: Payer: Self-pay | Admitting: Internal Medicine

## 2019-03-28 ENCOUNTER — Other Ambulatory Visit: Payer: Self-pay | Admitting: Gastroenterology

## 2019-04-05 ENCOUNTER — Other Ambulatory Visit: Payer: Self-pay

## 2019-04-06 ENCOUNTER — Encounter: Payer: Self-pay | Admitting: Internal Medicine

## 2019-04-06 NOTE — Telephone Encounter (Signed)
Last filled 12/30/2018, msg sent to pt to let her know she is overdue for CPE... please advise

## 2019-04-07 MED ORDER — CYCLOBENZAPRINE HCL 10 MG PO TABS: ORAL_TABLET | ORAL | 0 refills | Status: DC

## 2019-04-07 NOTE — Telephone Encounter (Signed)
Pt is scheduled for physical on 1/26. States she will be out of Flexeril on Monday.

## 2019-04-21 ENCOUNTER — Other Ambulatory Visit: Payer: Self-pay | Admitting: Internal Medicine

## 2019-04-21 DIAGNOSIS — I10 Essential (primary) hypertension: Secondary | ICD-10-CM

## 2019-05-10 ENCOUNTER — Other Ambulatory Visit: Payer: Self-pay | Admitting: Internal Medicine

## 2019-05-16 ENCOUNTER — Encounter: Payer: Self-pay | Admitting: Internal Medicine

## 2019-05-16 ENCOUNTER — Other Ambulatory Visit: Payer: Self-pay

## 2019-05-16 ENCOUNTER — Ambulatory Visit (INDEPENDENT_AMBULATORY_CARE_PROVIDER_SITE_OTHER): Payer: BC Managed Care – PPO | Admitting: Internal Medicine

## 2019-05-16 VITALS — BP 118/78 | HR 97 | Temp 97.6°F | Ht 65.75 in | Wt 181.0 lb

## 2019-05-16 DIAGNOSIS — B001 Herpesviral vesicular dermatitis: Secondary | ICD-10-CM | POA: Diagnosis not present

## 2019-05-16 DIAGNOSIS — M542 Cervicalgia: Secondary | ICD-10-CM

## 2019-05-16 DIAGNOSIS — K2100 Gastro-esophageal reflux disease with esophagitis, without bleeding: Secondary | ICD-10-CM

## 2019-05-16 DIAGNOSIS — G8929 Other chronic pain: Secondary | ICD-10-CM

## 2019-05-16 DIAGNOSIS — I10 Essential (primary) hypertension: Secondary | ICD-10-CM

## 2019-05-16 DIAGNOSIS — N3281 Overactive bladder: Secondary | ICD-10-CM

## 2019-05-16 DIAGNOSIS — Z0000 Encounter for general adult medical examination without abnormal findings: Secondary | ICD-10-CM | POA: Diagnosis not present

## 2019-05-16 MED ORDER — VALACYCLOVIR HCL 500 MG PO TABS: 500.0000 mg | ORAL_TABLET | Freq: Every day | ORAL | 3 refills | Status: DC

## 2019-05-16 MED ORDER — CYCLOBENZAPRINE HCL 10 MG PO TABS: 10.0000 mg | ORAL_TABLET | Freq: Every day | ORAL | 0 refills | Status: DC | PRN

## 2019-05-16 MED ORDER — BENAZEPRIL-HYDROCHLOROTHIAZIDE 10-12.5 MG PO TABS: 1.0000 | ORAL_TABLET | Freq: Every day | ORAL | 3 refills | Status: DC

## 2019-05-16 MED ORDER — OMEPRAZOLE 40 MG PO CPDR: 40.0000 mg | DELAYED_RELEASE_CAPSULE | Freq: Two times a day (BID) | ORAL | 3 refills | Status: DC

## 2019-05-16 NOTE — Progress Notes (Signed)
Subjective:    Patient ID: Morgan [name], female    DOB: [birth year], 44 y.o.   MRN: [id number]  HPI  Pt presents to the clinic today for her annual exam. She is also due to follow up chronic conditions.  Chronic Neck Pain: Managed with Ibuprofen and Flexeril. Xray from 09/2016 reviewed.     HTN: Her BP today is 118/78. She is taking Benazepril HCT as prescribed. ECG from 09/2017 reviewed.   GERD: She can't identify specific triggers. She denies breakthrough on Omeprazole. She has a hiatal hernia. There is no upper GI on file.   OAB: She c/o urinary frequency. She stopped taking Enablex 2 years ago. She follows with GYN for this.   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   Flu: 01/2019 Tetanus: 07/1996  Pap Smear: not a candidate Mammogram: 10/2018 Vision Screening: as needed Dentist: biannually   Diet: She eats meat and fruits, with some veggies. She likes fried foods. She drinks water mostly, some ginger-ale.  Exercise: No formal exercise, walks a lot at work.   Review of Systems      Past Medical History:  Diagnosis Date  . Complication of anesthesia    STATES TOLD REINTUBATION WITH HYSTERECTOMY  . Difficult intubation   . GERD (gastroesophageal reflux disease)   . Headache    MIGRAINES  . History of hiatal hernia   . Hypertension   . PONV (postoperative nausea and vomiting)   . Stiff neck    mild limitations of up/down mvmt s/p neck surgery    Current Outpatient Medications  Medication Sig Dispense Refill  . albuterol (VENTOLIN HFA) 108 (90 Base) MCG/ACT inhaler INHALE 2 PUFFS INTO THE LUNGS EVERY 6 HOURS AS NEEDED FOR WHEEZING OR SHORTNESS OF BREATH 8.5 g 0  . Aspirin-Acetaminophen-Caffeine (GOODY HEADACHE PO) Take by mouth daily as needed.    . benazepril-hydrochlorthiazide (LOTENSIN HCT) 10-12.5 MG tablet TAKE 1 TABLET BY MOUTH  DAILY 90 tablet 0  . cyclobenzaprine (FLEXERIL) 10 MG tablet TAKE 1 TABLET BY MOUTH TWO  TIMES DAILY AS NEEDED FOR   MUSCLE SPASM(S) 180 tablet 0  . omeprazole (PRILOSEC) 40 MG capsule TAKE 1 CAPSULE BY MOUTH  TWICE DAILY 180 capsule 1  . valACYclovir (VALTREX) 500 MG tablet TAKE 1 TABLET BY MOUTH  DAILY MUST SCHEDULE  PHYSICAL EXAM 90 tablet 0   No current facility-administered medications for this visit.    Allergies  Allergen Reactions  . Adhesive [Tape] Other (See Comments)    Bruises skin  . Latex Other (See Comments)    Swelling (vaginal)  . Lexapro [Escitalopram Oxalate] Other (See Comments)    Loss taste  . Metoprolol Other (See Comments)    Lowers heart rate  . Topamax [Topiramate]     Loss taste    Family History  Problem Relation Age of Onset  . Arthritis Mother   . Arthritis Father   . Stroke Father   . Hypertension Father   . Heart disease Maternal Uncle   . Arthritis Maternal Grandmother   . Heart disease Maternal Grandmother   . Hypertension Maternal Grandmother   . Arthritis Maternal Grandfather   . Lung cancer Maternal Grandfather   . Heart disease Maternal Grandfather   . Arthritis Paternal Grandmother   . Heart disease Paternal Grandmother   . Hypertension Paternal Grandmother   . Arthritis Paternal Grandfather   . Heart disease Paternal Grandfather   . Stroke Paternal Grandfather     Social History  Socioeconomic History  . Marital status: Married    Spouse name: Not on file  . Number of children: Not on file  . Years of education: Not on file  . Highest education level: Not on file  Occupational History  . Not on file  Tobacco Use  . Smoking status: Former Smoker    Quit date: 09/30/2016    Years since quitting: 2.6  . Smokeless tobacco: Never Used  Substance and Sexual Activity  . Alcohol use: Not Currently    Comment: occasional  . Drug use: No  . Sexual activity: Yes  Other Topics Concern  . Not on file  Social History Narrative  . Not on file   Social Determinants of Health   Financial Resource Strain:   . Difficulty of Paying Living  Expenses: Not on file  Food Insecurity:   . Worried About Charity fundraiser in the Last Year: Not on file  . Ran Out of Food in the Last Year: Not on file  Transportation Needs:   . Lack of Transportation (Medical): Not on file  . Lack of Transportation (Non-Medical): Not on file  Physical Activity:   . Days of Exercise per Week: Not on file  . Minutes of Exercise per Session: Not on file  Stress:   . Feeling of Stress : Not on file  Social Connections:   . Frequency of Communication with Friends and Family: Not on file  . Frequency of Social Gatherings with Friends and Family: Not on file  . Attends Religious Services: Not on file  . Active Member of Clubs or Organizations: Not on file  . Attends Archivist Meetings: Not on file  . Marital Status: Not on file  Intimate Partner Violence:   . Fear of Current or Ex-Partner: Not on file  . Emotionally Abused: Not on file  . Physically Abused: Not on file  . Sexually Abused: Not on file     Constitutional: Denies fever, malaise, fatigue, headache or abrupt weight changes.  HEENT: Denies eye pain, eye redness, ear pain, ringing in the ears, wax buildup, runny nose, nasal congestion, bloody nose, or sore throat. Respiratory: Denies difficulty breathing, shortness of breath, cough or sputum production.   Cardiovascular: Denies chest pain, chest tightness, palpitations or swelling in the hands or feet.  Gastrointestinal: Denies abdominal pain, bloating, constipation, diarrhea or blood in the stool. She no longer experiences reflux symptoms.  GU: Pt repots urinary frequency. Denies urgency, pain with urination, burning sensation, blood in urine, odor or discharge. Musculoskeletal: Pt reports chronic neck pain. Denies decrease in range of motion, difficulty with gait, or joint swelling.  Skin: Pt reports intermittent cold sores. Denies redness, rashes,  or ulcercations.  Neurological: Denies dizziness, difficulty with memory,  difficulty with speech or problems with balance and coordination.  Psych: Denies anxiety, depression, SI/HI.  No other specific complaints in a complete review of systems (except as listed in HPI above).  Objective:   Physical Exam  BP 118/78   Pulse 97   Temp 97.6 F (36.4 C) (Temporal)   Ht 5' 5.75" (1.67 m)   Wt 181 lb (82.1 kg)   SpO2 98%   BMI 29.44 kg/m   Wt Readings from Last 3 Encounters:  12/06/18 186 lb 5 oz (84.5 kg)  05/08/18 180 lb (81.6 kg)  04/18/18 186 lb (84.4 kg)    General: Appears her stated age, well developed, well nourished in NAD. Skin: Warm, dry and intact. No rashes,  lesions  noted. HEENT: Head: normal shape and size; Eyes: sclera white, no icterus, conjunctiva pink, PERRLA and EOMs intact. Neck: Thyromegaly present. Neck supple, trachea midline. No masses, or lumps.  Cardiovascular: Normal rate and rhythm. S1,S2 noted.  No murmur, rubs or gallops noted. No JVD or BLE edema.  Pulmonary/Chest: Normal effort and positive vesicular breath sounds. No respiratory distress. No wheezes, rales or ronchi noted.  Abdomen: Soft and nontender. Normal bowel sounds. No distention or masses noted. Liver, spleen and kidneys non palpable. Musculoskeletal: Strength 5/5 BUE/BLE. No difficulty with gait.  Neurological: Alert and oriented. Cranial nerves II-XII grossly intact. Coordination normal.  Psychiatric: Mood and affect normal. Behavior is normal. Judgment and thought content normal.     BMET    Component Value Date/Time   NA 141 12/06/2018 1624   K 3.6 12/06/2018 1624   CL 100 12/06/2018 1624   CO2 30 12/06/2018 1624   GLUCOSE 97 12/06/2018 1624   BUN 11 12/06/2018 1624   CREATININE 0.81 12/06/2018 1624   CREATININE 0.76 11/23/2016 1527   CALCIUM 9.7 12/06/2018 1624   GFRNONAA >60 09/30/2017 1334   GFRAA >60 09/30/2017 1334    Lipid Panel     Component Value Date/Time   CHOL 241 (H) 01/17/2018 1520   TRIG 136.0 01/17/2018 1520   HDL 68.50  01/17/2018 1520   CHOLHDL 4 01/17/2018 1520   VLDL 27.2 01/17/2018 1520   LDLCALC 145 (H) 01/17/2018 1520    CBC    Component Value Date/Time   WBC 9.4 12/06/2018 1624   RBC 4.74 12/06/2018 1624   HGB 11.9 (L) 12/06/2018 1624   HCT 37.0 12/06/2018 1624   PLT 336.0 12/06/2018 1624   MCV 78.1 12/06/2018 1624   MCH 27.1 09/30/2017 1334   MCHC 32.1 12/06/2018 1624   RDW 15.5 12/06/2018 1624   LYMPHSABS 2.7 12/06/2018 1624   MONOABS 0.8 12/06/2018 1624   EOSABS 0.4 12/06/2018 1624   BASOSABS 0.1 12/06/2018 1624    Hgb A1C No results found for: HGBA1C         Assessment & Plan:   Preventative Health Maintenance:  Flu shot UTD Tetanus declined She no longer needs pap smear, pelvic exams every 5 years, declines today Mammogram UTD Encouraged her to consume a balanced diet and exercise regimen Advised her to see an eye doctor and dentist annually Will check CBC, CMET, Lipid and Vit D today  RTC in 1 year, sooner if needed Nicki Reaper, NP This visit occurred during the SARS-CoV-2 public health emergency.  Safety protocols were in place, including screening questions prior to the visit, additional usage of staff PPE, and extensive cleaning of exam room while observing appropriate contact time as indicated for disinfecting solutions.

## 2019-05-16 NOTE — Assessment & Plan Note (Signed)
Continue Valtrex for suppressive therapy, refilled today

## 2019-05-16 NOTE — Assessment & Plan Note (Addendum)
Continue Benazepril HCT, refilled today Reinforced DASH diet and exercise for weight loss CMET today

## 2019-05-16 NOTE — Patient Instructions (Signed)
Health Maintenance, Female Adopting a healthy lifestyle and getting preventive care are important in promoting health and wellness. Ask your health care provider about:  The right schedule for you to have regular tests and exams.  Things you can do on your own to prevent diseases and keep yourself healthy. What should I know about diet, weight, and exercise? Eat a healthy diet   Eat a diet that includes plenty of vegetables, fruits, low-fat dairy products, and lean protein.  Do not eat a lot of foods that are high in solid fats, added sugars, or sodium. Maintain a healthy weight Body mass index (BMI) is used to identify weight problems. It estimates body fat based on height and weight. Your health care provider can help determine your BMI and help you achieve or maintain a healthy weight. Get regular exercise Get regular exercise. This is one of the most important things you can do for your health. Most adults should:  Exercise for at least 150 minutes each week. The exercise should increase your heart rate and make you sweat (moderate-intensity exercise).  Do strengthening exercises at least twice a week. This is in addition to the moderate-intensity exercise.  Spend less time sitting. Even light physical activity can be beneficial. Watch cholesterol and blood lipids Have your blood tested for lipids and cholesterol at 42 years of age, then have this test every 5 years. Have your cholesterol levels checked more often if:  Your lipid or cholesterol levels are high.  You are older than 42 years of age.  You are at high risk for heart disease. What should I know about cancer screening? Depending on your health history and family history, you may need to have cancer screening at various ages. This may include screening for:  Breast cancer.  Cervical cancer.  Colorectal cancer.  Skin cancer.  Lung cancer. What should I know about heart disease, diabetes, and high blood  pressure? Blood pressure and heart disease  High blood pressure causes heart disease and increases the risk of stroke. This is more likely to develop in people who have high blood pressure readings, are of African descent, or are overweight.  Have your blood pressure checked: ? Every 3-5 years if you are 18-39 years of age. ? Every year if you are 40 years old or older. Diabetes Have regular diabetes screenings. This checks your fasting blood sugar level. Have the screening done:  Once every three years after age 40 if you are at a normal weight and have a low risk for diabetes.  More often and at a younger age if you are overweight or have a high risk for diabetes. What should I know about preventing infection? Hepatitis B If you have a higher risk for hepatitis B, you should be screened for this virus. Talk with your health care provider to find out if you are at risk for hepatitis B infection. Hepatitis C Testing is recommended for:  Everyone born from 1945 through 1965.  Anyone with known risk factors for hepatitis C. Sexually transmitted infections (STIs)  Get screened for STIs, including gonorrhea and chlamydia, if: ? You are sexually active and are younger than 42 years of age. ? You are older than 42 years of age and your health care provider tells you that you are at risk for this type of infection. ? Your sexual activity has changed since you were last screened, and you are at increased risk for chlamydia or gonorrhea. Ask your health care provider if   you are at risk.  Ask your health care provider about whether you are at high risk for HIV. Your health care provider may recommend a prescription medicine to help prevent HIV infection. If you choose to take medicine to prevent HIV, you should first get tested for HIV. You should then be tested every 3 months for as long as you are taking the medicine. Pregnancy  If you are about to stop having your period (premenopausal) and  you may become pregnant, seek counseling before you get pregnant.  Take 400 to 800 micrograms (mcg) of folic acid every day if you become pregnant.  Ask for birth control (contraception) if you want to prevent pregnancy. Osteoporosis and menopause Osteoporosis is a disease in which the bones lose minerals and strength with aging. This can result in bone fractures. If you are 65 years old or older, or if you are at risk for osteoporosis and fractures, ask your health care provider if you should:  Be screened for bone loss.  Take a calcium or vitamin D supplement to lower your risk of fractures.  Be given hormone replacement therapy (HRT) to treat symptoms of menopause. Follow these instructions at home: Lifestyle  Do not use any products that contain nicotine or tobacco, such as cigarettes, e-cigarettes, and chewing tobacco. If you need help quitting, ask your health care provider.  Do not use street drugs.  Do not share needles.  Ask your health care provider for help if you need support or information about quitting drugs. Alcohol use  Do not drink alcohol if: ? Your health care provider tells you not to drink. ? You are pregnant, may be pregnant, or are planning to become pregnant.  If you drink alcohol: ? Limit how much you use to 0-1 drink a day. ? Limit intake if you are breastfeeding.  Be aware of how much alcohol is in your drink. In the [place] one drink equals one 12 oz bottle of beer (355 mL), one 5 oz glass of wine (148 mL), or one 1 oz glass of hard liquor (44 mL). General instructions  Schedule regular health, dental, and eye exams.  Stay current with your vaccines.  Tell your health care provider if: ? You often feel depressed. ? You have ever been abused or do not feel safe at home. Summary  Adopting a healthy lifestyle and getting preventive care are important in promoting health and wellness.  Follow your health care provider's instructions about healthy  diet, exercising, and getting tested or screened for diseases.  Follow your health care provider's instructions on monitoring your cholesterol and blood pressure. This information is not intended to replace advice given to you by your health care provider. Make sure you discuss any questions you have with your health care provider. Document Revised: 03/30/2018 Document Reviewed: 03/30/2018 Elsevier Patient Education  2020 Elsevier Inc.  

## 2019-05-16 NOTE — Assessment & Plan Note (Signed)
Continue Enablex Continue to follow with GYN

## 2019-05-16 NOTE — Assessment & Plan Note (Addendum)
Continue Omeprazole, refilled today Avoid triggers CBC and CMET today

## 2019-05-16 NOTE — Assessment & Plan Note (Addendum)
Continue Ibuprofen and Flexeril, refilled today Encouraged regular stretching

## 2019-05-17 LAB — LIPID PANEL
Cholesterol: 261 mg/dL — ABNORMAL HIGH (ref 0–200)
HDL: 70.6 mg/dL (ref >39.00)
LDL Cholesterol: 177 mg/dL — ABNORMAL HIGH (ref 0–99)
NonHDL: 190.14
Total CHOL/HDL Ratio: 4
Triglycerides: 66 mg/dL (ref 0.0–149.0)
VLDL: 13.2 mg/dL (ref 0.0–40.0)

## 2019-05-17 LAB — COMPREHENSIVE METABOLIC PANEL
ALT: 13 U/L (ref 0–35)
AST: 19 U/L (ref 0–37)
Albumin: 4.5 g/dL (ref 3.5–5.2)
Alkaline Phosphatase: 99 U/L (ref 39–117)
BUN: 12 mg/dL (ref 6–23)
CO2: 28 mEq/L (ref 19–32)
Calcium: 9.3 mg/dL (ref 8.4–10.5)
Chloride: 101 mEq/L (ref 96–112)
Creatinine, Ser: 0.87 mg/dL (ref 0.40–1.20)
GFR: 71.47 mL/min (ref >60.00)
Glucose, Bld: 85 mg/dL (ref 70–99)
Potassium: 3.5 mEq/L (ref 3.5–5.1)
Sodium: 139 mEq/L (ref 135–145)
Total Bilirubin: 0.3 mg/dL (ref 0.2–1.2)
Total Protein: 7.5 g/dL (ref 6.0–8.3)

## 2019-05-17 LAB — CBC
HCT: 37.2 % (ref 36.0–46.0)
Hemoglobin: 11.9 g/dL — ABNORMAL LOW (ref 12.0–15.0)
MCHC: 32 g/dL (ref 30.0–36.0)
MCV: 80.1 fl (ref 78.0–100.0)
Platelets: 334 10*3/uL (ref 150.0–400.0)
RBC: 4.65 Mil/uL (ref 3.87–5.11)
RDW: 16.2 % — ABNORMAL HIGH (ref 11.5–15.5)
WBC: 8 10*3/uL (ref 4.0–10.5)

## 2019-05-17 LAB — VITAMIN D 25 HYDROXY (VIT D DEFICIENCY, FRACTURES): VITD: 25.95 ng/mL — ABNORMAL LOW (ref 30.00–100.00)

## 2019-05-19 ENCOUNTER — Encounter: Payer: Self-pay | Admitting: Internal Medicine

## 2019-05-19 DIAGNOSIS — E7800 Pure hypercholesterolemia, unspecified: Secondary | ICD-10-CM

## 2019-05-19 NOTE — Addendum Note (Signed)
Addended by: Roena Malady on: 05/19/2019 02:09 PM   Modules accepted: Orders

## 2019-05-22 MED ORDER — SIMVASTATIN 10 MG PO TABS: 10.0000 mg | ORAL_TABLET | Freq: Every day | ORAL | 2 refills | Status: DC

## 2019-05-30 MED ORDER — ATORVASTATIN CALCIUM 10 MG PO TABS: 10.0000 mg | ORAL_TABLET | Freq: Every day | ORAL | 2 refills | Status: DC

## 2019-05-30 NOTE — Addendum Note (Signed)
Addended by: Roena Malady on: 05/30/2019 12:43 PM   Modules accepted: Orders

## 2019-06-06 NOTE — Addendum Note (Signed)
Addended by: Roena Malady on: 06/06/2019 09:56 AM   Modules accepted: Orders

## 2019-06-13 ENCOUNTER — Telehealth: Payer: Self-pay | Admitting: *Deleted

## 2019-06-13 ENCOUNTER — Encounter: Payer: Self-pay | Admitting: Internal Medicine

## 2019-06-13 NOTE — Telephone Encounter (Signed)
It would really be up to her job. If she gets tested and is negative, they may not let her go back for 14 days, that is up to them not me. My recommendation would be to wait 1 week, if no symptoms, could get tested and if that is negative I would think she could go back to work, but the final say comes from her employer.

## 2019-06-13 NOTE — Telephone Encounter (Signed)
Patient left a voicemail stating that she just was informed by her daughter that she tested positive for covid today. Patient stated that her works has told her she has to stay home for two weeks. Patient wants advice about being tested and if she is negative can she go back to work? Patient stated that her daughter was just at her house last night.

## 2019-06-13 NOTE — Telephone Encounter (Signed)
Patient advised and verbalized understanding 

## 2019-06-13 NOTE — Telephone Encounter (Signed)
Left message on voicemail.

## 2019-06-22 ENCOUNTER — Encounter: Payer: Self-pay | Admitting: Internal Medicine

## 2019-06-23 MED ORDER — ATORVASTATIN CALCIUM 10 MG PO TABS: 10.0000 mg | ORAL_TABLET | Freq: Every day | ORAL | 0 refills | Status: DC

## 2019-07-10 ENCOUNTER — Other Ambulatory Visit: Payer: Self-pay | Admitting: Internal Medicine

## 2019-07-10 DIAGNOSIS — M542 Cervicalgia: Secondary | ICD-10-CM | POA: Diagnosis not present

## 2019-07-10 DIAGNOSIS — R413 Other amnesia: Secondary | ICD-10-CM | POA: Diagnosis not present

## 2019-07-10 DIAGNOSIS — R519 Headache, unspecified: Secondary | ICD-10-CM | POA: Diagnosis not present

## 2019-07-10 NOTE — Telephone Encounter (Signed)
I do not see a diagnosis for Asthma and this was originally prescribed during a visit from possible exposure... please advise if appropriate, last filled 01/2019

## 2019-07-24 DIAGNOSIS — G479 Sleep disorder, unspecified: Secondary | ICD-10-CM | POA: Diagnosis not present

## 2019-07-24 DIAGNOSIS — R519 Headache, unspecified: Secondary | ICD-10-CM | POA: Diagnosis not present

## 2019-07-24 DIAGNOSIS — M542 Cervicalgia: Secondary | ICD-10-CM | POA: Diagnosis not present

## 2019-07-24 DIAGNOSIS — R413 Other amnesia: Secondary | ICD-10-CM | POA: Diagnosis not present

## 2019-08-07 ENCOUNTER — Encounter: Payer: Self-pay | Admitting: Internal Medicine

## 2019-08-16 ENCOUNTER — Encounter: Payer: Self-pay | Admitting: Internal Medicine

## 2019-08-16 ENCOUNTER — Other Ambulatory Visit: Payer: Self-pay | Admitting: Internal Medicine

## 2019-08-25 NOTE — Telephone Encounter (Signed)
PA for Omeprazole has been submitted via [email or web address] for [facility]

## 2019-08-25 NOTE — Telephone Encounter (Signed)
[id number]. OMEPRAZOLE CAP 40MG  is approved through 08/24/2020.

## 2019-08-28 MED ORDER — OMEPRAZOLE 40 MG PO CPDR: 40.0000 mg | DELAYED_RELEASE_CAPSULE | Freq: Two times a day (BID) | ORAL | 0 refills | Status: DC

## 2019-08-28 MED ORDER — OMEPRAZOLE 40 MG PO CPDR: 40.0000 mg | DELAYED_RELEASE_CAPSULE | Freq: Two times a day (BID) | ORAL | 3 refills | Status: DC

## 2019-08-28 NOTE — Addendum Note (Signed)
Addended by: Radford Pax M on: 08/28/2019 10:59 AM   Modules accepted: Orders

## 2019-08-28 NOTE — Addendum Note (Signed)
Addended by: Verlan Friends on: 08/28/2019 01:33 PM   Modules accepted: Orders

## 2019-08-29 ENCOUNTER — Other Ambulatory Visit: Payer: BC Managed Care – PPO

## 2019-09-24 ENCOUNTER — Encounter: Payer: Self-pay | Admitting: Internal Medicine

## 2019-10-02 ENCOUNTER — Other Ambulatory Visit: Payer: Self-pay | Admitting: Internal Medicine

## 2019-10-05 ENCOUNTER — Other Ambulatory Visit: Payer: BC Managed Care – PPO

## 2019-10-10 ENCOUNTER — Other Ambulatory Visit: Payer: Self-pay | Admitting: Obstetrics and Gynecology

## 2019-10-10 ENCOUNTER — Other Ambulatory Visit (INDEPENDENT_AMBULATORY_CARE_PROVIDER_SITE_OTHER): Payer: BC Managed Care – PPO

## 2019-10-10 DIAGNOSIS — Z1231 Encounter for screening mammogram for malignant neoplasm of breast: Secondary | ICD-10-CM

## 2019-10-10 DIAGNOSIS — E7800 Pure hypercholesterolemia, unspecified: Secondary | ICD-10-CM

## 2019-10-10 DIAGNOSIS — Z01419 Encounter for gynecological examination (general) (routine) without abnormal findings: Secondary | ICD-10-CM | POA: Diagnosis not present

## 2019-10-10 LAB — COMPREHENSIVE METABOLIC PANEL
ALT: 14 U/L (ref 0–35)
AST: 15 U/L (ref 0–37)
Albumin: 4.6 g/dL (ref 3.5–5.2)
Alkaline Phosphatase: 97 U/L (ref 39–117)
BUN: 17 mg/dL (ref 6–23)
CO2: 31 mEq/L (ref 19–32)
Calcium: 9.4 mg/dL (ref 8.4–10.5)
Chloride: 102 mEq/L (ref 96–112)
Creatinine, Ser: 0.85 mg/dL (ref 0.40–1.20)
GFR: 73.27 mL/min (ref >60.00)
Glucose, Bld: 97 mg/dL (ref 70–99)
Potassium: 3.9 mEq/L (ref 3.5–5.1)
Sodium: 142 mEq/L (ref 135–145)
Total Bilirubin: 0.3 mg/dL (ref 0.2–1.2)
Total Protein: 7.1 g/dL (ref 6.0–8.3)

## 2019-10-10 LAB — LIPID PANEL
Cholesterol: 194 mg/dL (ref 0–200)
HDL: 59.4 mg/dL (ref >39.00)
LDL Cholesterol: 106 mg/dL — ABNORMAL HIGH (ref 0–99)
NonHDL: 134.34
Total CHOL/HDL Ratio: 3
Triglycerides: 141 mg/dL (ref 0.0–149.0)
VLDL: 28.2 mg/dL (ref 0.0–40.0)

## 2019-10-18 ENCOUNTER — Encounter: Payer: Self-pay | Admitting: Internal Medicine

## 2019-10-26 MED ORDER — CYCLOBENZAPRINE HCL 10 MG PO TABS: 20.0000 mg | ORAL_TABLET | Freq: Every day | ORAL | 0 refills | Status: DC

## 2019-11-01 ENCOUNTER — Other Ambulatory Visit: Payer: Self-pay | Admitting: Internal Medicine

## 2019-12-11 ENCOUNTER — Other Ambulatory Visit: Payer: Self-pay | Admitting: Internal Medicine

## 2019-12-28 DIAGNOSIS — M542 Cervicalgia: Secondary | ICD-10-CM | POA: Diagnosis not present

## 2019-12-28 DIAGNOSIS — R519 Headache, unspecified: Secondary | ICD-10-CM | POA: Diagnosis not present

## 2019-12-28 DIAGNOSIS — G479 Sleep disorder, unspecified: Secondary | ICD-10-CM | POA: Diagnosis not present

## 2019-12-28 DIAGNOSIS — R413 Other amnesia: Secondary | ICD-10-CM | POA: Diagnosis not present

## 2020-01-05 ENCOUNTER — Other Ambulatory Visit: Payer: Self-pay | Admitting: Neurology

## 2020-01-05 ENCOUNTER — Other Ambulatory Visit (HOSPITAL_COMMUNITY): Payer: Self-pay | Admitting: Neurology

## 2020-01-05 DIAGNOSIS — M5412 Radiculopathy, cervical region: Secondary | ICD-10-CM

## 2020-01-08 ENCOUNTER — Other Ambulatory Visit: Payer: Self-pay

## 2020-01-08 ENCOUNTER — Ambulatory Visit
Admission: RE | Admit: 2020-01-08 | Discharge: 2020-01-08 | Disposition: A | Discharge status: Home / Self Care | Payer: BC Managed Care – PPO | Source: Ambulatory Visit | Attending: Neurology | Admitting: Neurology

## 2020-01-08 DIAGNOSIS — M5412 Radiculopathy, cervical region: Secondary | ICD-10-CM | POA: Insufficient documentation

## 2020-01-08 DIAGNOSIS — M50223 Other cervical disc displacement at C6-C7 level: Secondary | ICD-10-CM | POA: Diagnosis not present

## 2020-01-08 DIAGNOSIS — M5021 Other cervical disc displacement,  high cervical region: Secondary | ICD-10-CM | POA: Diagnosis not present

## 2020-01-08 DIAGNOSIS — M4802 Spinal stenosis, cervical region: Secondary | ICD-10-CM | POA: Diagnosis not present

## 2020-01-08 DIAGNOSIS — M2578 Osteophyte, vertebrae: Secondary | ICD-10-CM | POA: Diagnosis not present

## 2020-01-15 DIAGNOSIS — M5412 Radiculopathy, cervical region: Secondary | ICD-10-CM | POA: Diagnosis not present

## 2020-01-15 DIAGNOSIS — M5030 Other cervical disc degeneration, unspecified cervical region: Secondary | ICD-10-CM | POA: Diagnosis not present

## 2020-01-22 ENCOUNTER — Other Ambulatory Visit: Payer: Self-pay | Admitting: Internal Medicine

## 2020-01-25 DIAGNOSIS — M5030 Other cervical disc degeneration, unspecified cervical region: Secondary | ICD-10-CM | POA: Diagnosis not present

## 2020-01-25 DIAGNOSIS — M5416 Radiculopathy, lumbar region: Secondary | ICD-10-CM | POA: Diagnosis not present

## 2020-01-25 DIAGNOSIS — M5412 Radiculopathy, cervical region: Secondary | ICD-10-CM | POA: Diagnosis not present

## 2020-01-25 DIAGNOSIS — M5136 Other intervertebral disc degeneration, lumbar region: Secondary | ICD-10-CM | POA: Diagnosis not present

## 2020-02-15 DIAGNOSIS — M5412 Radiculopathy, cervical region: Secondary | ICD-10-CM | POA: Diagnosis not present

## 2020-02-15 DIAGNOSIS — M5030 Other cervical disc degeneration, unspecified cervical region: Secondary | ICD-10-CM | POA: Diagnosis not present

## 2020-03-16 ENCOUNTER — Other Ambulatory Visit: Payer: Self-pay | Admitting: Internal Medicine

## 2020-03-16 DIAGNOSIS — I10 Essential (primary) hypertension: Secondary | ICD-10-CM

## 2020-03-20 HISTORY — PX: BACK SURGERY: SHX140

## 2020-03-24 ENCOUNTER — Other Ambulatory Visit: Payer: Self-pay | Admitting: Internal Medicine

## 2020-03-29 ENCOUNTER — Other Ambulatory Visit: Payer: Self-pay | Admitting: Internal Medicine

## 2020-04-15 DIAGNOSIS — S39012A Strain of muscle, fascia and tendon of lower back, initial encounter: Secondary | ICD-10-CM | POA: Diagnosis not present

## 2020-04-17 ENCOUNTER — Other Ambulatory Visit: Payer: Self-pay

## 2020-04-17 ENCOUNTER — Emergency Department: Payer: BC Managed Care – PPO

## 2020-04-17 ENCOUNTER — Emergency Department
Admission: EM | Admit: 2020-04-17 | Discharge: 2020-04-17 | Disposition: A | Discharge status: Home / Self Care | Payer: BC Managed Care – PPO | Attending: Emergency Medicine | Admitting: Emergency Medicine

## 2020-04-17 DIAGNOSIS — Z91040 Latex allergy status: Secondary | ICD-10-CM | POA: Insufficient documentation

## 2020-04-17 DIAGNOSIS — Z79899 Other long term (current) drug therapy: Secondary | ICD-10-CM | POA: Diagnosis not present

## 2020-04-17 DIAGNOSIS — I10 Essential (primary) hypertension: Secondary | ICD-10-CM | POA: Diagnosis not present

## 2020-04-17 DIAGNOSIS — M5441 Lumbago with sciatica, right side: Secondary | ICD-10-CM

## 2020-04-17 DIAGNOSIS — M545 Low back pain, unspecified: Secondary | ICD-10-CM | POA: Diagnosis not present

## 2020-04-17 DIAGNOSIS — Z87891 Personal history of nicotine dependence: Secondary | ICD-10-CM | POA: Insufficient documentation

## 2020-04-17 MED ORDER — ORPHENADRINE CITRATE 30 MG/ML IJ SOLN
60.0000 mg | Freq: Two times a day (BID) | INTRAMUSCULAR | Status: DC
  Administered 2020-04-17: 60 mg via INTRAVENOUS
  Filled 2020-04-17: qty 2

## 2020-04-17 MED ORDER — DEXAMETHASONE SODIUM PHOSPHATE 10 MG/ML IJ SOLN
10.0000 mg | Freq: Once | INTRAMUSCULAR | Status: AC
  Administered 2020-04-17: 10 mg via INTRAVENOUS
  Filled 2020-04-17: qty 1

## 2020-04-17 MED ORDER — OXYCODONE-ACETAMINOPHEN 10-325 MG PO TABS: 1.0000 | ORAL_TABLET | Freq: Four times a day (QID) | ORAL | 0 refills | Status: AC | PRN

## 2020-04-17 MED ORDER — TIZANIDINE HCL 4 MG PO TABS: 4.0000 mg | ORAL_TABLET | Freq: Three times a day (TID) | ORAL | 0 refills | Status: DC

## 2020-04-17 MED ORDER — ONDANSETRON HCL 4 MG/2ML IJ SOLN
4.0000 mg | Freq: Once | INTRAMUSCULAR | Status: AC
  Administered 2020-04-17: 4 mg via INTRAVENOUS
  Filled 2020-04-17: qty 2

## 2020-04-17 MED ORDER — MORPHINE SULFATE (PF) 4 MG/ML IV SOLN
4.0000 mg | Freq: Once | INTRAVENOUS | Status: AC
  Administered 2020-04-17: 4 mg via INTRAVENOUS
  Filled 2020-04-17: qty 1

## 2020-04-17 MED ORDER — KETOROLAC TROMETHAMINE 30 MG/ML IJ SOLN
15.0000 mg | Freq: Once | INTRAMUSCULAR | Status: AC
  Administered 2020-04-17: 15 mg via INTRAVENOUS
  Filled 2020-04-17: qty 1

## 2020-04-17 MED ORDER — TIZANIDINE HCL 4 MG PO TABS
8.0000 mg | ORAL_TABLET | Freq: Once | ORAL | Status: DC
  Filled 2020-04-17 (×2): qty 2

## 2020-04-17 MED ORDER — OXYCODONE HCL 5 MG PO TABS
5.0000 mg | ORAL_TABLET | Freq: Once | ORAL | Status: DC
  Filled 2020-04-17: qty 1

## 2020-04-17 MED ORDER — PREDNISONE 20 MG PO TABS
60.0000 mg | ORAL_TABLET | Freq: Once | ORAL | Status: AC
  Administered 2020-04-17: 60 mg via ORAL
  Filled 2020-04-17: qty 3

## 2020-04-17 MED ORDER — PREDNISONE 10 MG (21) PO TBPK: ORAL_TABLET | ORAL | 0 refills | Status: DC

## 2020-04-17 NOTE — ED Notes (Signed)
Pt given ice chips at this time 

## 2020-04-17 NOTE — ED Notes (Signed)
Patient transported to MRI 

## 2020-04-17 NOTE — Discharge Instructions (Signed)
Please call and schedule an appointment with Dr. Adriana Simas.  Take the medications as prescribed.  Follow up with the primary care provider for symptoms that change, worsen, or for new concerns.

## 2020-04-17 NOTE — ED Triage Notes (Signed)
C/O rightl ow back pain radiating down right leg.  States pain started Christmas day.  States has chronic back pain problems.

## 2020-04-17 NOTE — ED Provider Notes (Signed)
[facility] Emergency Department Provider Note ____________________________________________  Time seen: Approximately 4:04 PM  I have reviewed the triage vital signs and the nursing notes.  HISTORY  Chief Complaint Back Pain   HPI Morgan [name] is a 44 y.o. female presenting to the emergency department for treatment and evaluation of acute onset right side low back pain that radiates into the right lower extremity.  Symptoms started on April 13, 2020 after she bent over and felt a "pop."  She has had extreme difficulty walking since.  No relief with over-the-counter medications.  Past Medical History:  Diagnosis Date  . Complication of anesthesia    STATES TOLD REINTUBATION WITH HYSTERECTOMY  . Difficult intubation   . GERD (gastroesophageal reflux disease)   . Headache    MIGRAINES  . History of hiatal hernia   . Hypertension   . PONV (postoperative nausea and vomiting)   . Stiff neck    mild limitations of up/down mvmt s/p neck surgery    Patient Active Problem List   Diagnosis Date Noted  . 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 OAB (overactive bladder) 01/17/2018  . Chronic neck pain 12/13/2017  . HTN (hypertension) 10/29/2016  . GERD (gastroesophageal reflux disease) 10/29/2016    Past Surgical History:  Procedure Laterality Date  . ABDOMINAL HYSTERECTOMY    . BALLOON DILATION N/A 04/18/2018   Procedure: BALLOON DILATION;  Surgeon: [name], MD;  Location: [facility];  Service: Endoscopy;  Laterality: N/A;  . ESOPHAGOGASTRODUODENOSCOPY (EGD) WITH PROPOFOL N/A 04/18/2018   Procedure: ESOPHAGOGASTRODUODENOSCOPY (EGD) WITH BIOPSIES;  Surgeon: [name], MD;  Location: [facility];  Service: Endoscopy;  Laterality: N/A;  . NECK SURGERY     FUSION C5-C7  . OOPHORECTOMY    . THYROIDECTOMY    . TRACHELECTOMY N/A 10/15/2017   Procedure: TRACHELECTOMY;  Surgeon: Schermerhorn, Ihor Austin, MD;  Location: [facility] ORS;  Service:  Gynecology;  Laterality: N/A;  . TUBAL LIGATION      Prior to Admission medications   Medication Sig Start Date End Date Taking? Authorizing Provider  oxyCODONE-acetaminophen (PERCOCET) 10-325 MG tablet Take 1 tablet by mouth every 6 (six) hours as needed for up to 5 days for pain. 04/17/20 04/22/20 Yes Joice Nazario B, FNP  predniSONE (STERAPRED UNI-PAK 21 TAB) 10 MG (21) TBPK tablet Take 6 tablets on the first day and decrease by 1 tablet each day until finished. 04/17/20  Yes Jefry Lesinski B, FNP  tiZANidine (ZANAFLEX) 4 MG tablet Take 1 tablet (4 mg total) by mouth 3 (three) times daily. 04/17/20  Yes Vercie Pokorny B, FNP  albuterol (VENTOLIN HFA) 108 (90 Base) MCG/ACT inhaler INHALE 2 PUFFS INTO THE LUNGS EVERY 6 HOURS AS NEEDED FOR WHEEZING OR SHORTNESS OF BREATH 02/13/19   Lorre Munroe, NP  Aspirin-Acetaminophen-Caffeine (GOODY HEADACHE PO) Take by mouth daily as needed.    [provider]  atorvastatin (LIPITOR) 10 MG tablet TAKE 1 TABLET BY MOUTH  DAILY 01/24/20   Lorre Munroe, NP  benazepril-hydrochlorthiazide (LOTENSIN HCT) 10-12.5 MG tablet TAKE 1 TABLET BY MOUTH  DAILY 03/17/20   Lorre Munroe, NP  cyclobenzaprine (FLEXERIL) 10 MG tablet Take 2 tablets (20 mg total) by mouth at bedtime. 03/26/20   Lorre Munroe, NP  omeprazole (PRILOSEC) 40 MG capsule Take 1 capsule (40 mg total) by mouth 2 (two) times daily. 08/28/19   Lorre Munroe, NP  valACYclovir (VALTREX) 500 MG tablet TAKE 1 TABLET BY MOUTH  DAILY 04/02/20  Lorre Munroe, NP    Allergies Adhesive [tape], Latex, Lexapro [escitalopram oxalate], Metoprolol, and Topamax [topiramate]  Family History  Problem Relation Age of Onset  . Arthritis Mother   . Arthritis Father   . Stroke Father   . Hypertension Father   . Heart disease Maternal Uncle   . Arthritis Maternal Grandmother   . Heart disease Maternal Grandmother   . Hypertension Maternal Grandmother   . Arthritis Maternal Grandfather   . Lung  cancer Maternal Grandfather   . Heart disease Maternal Grandfather   . Arthritis Paternal Grandmother   . Heart disease Paternal Grandmother   . Hypertension Paternal Grandmother   . Arthritis Paternal Grandfather   . Heart disease Paternal Grandfather   . Stroke Paternal Grandfather     Social History Social History   Tobacco Use  . Smoking status: Former Smoker    Quit date: 09/30/2016    Years since quitting: 3.5  . Smokeless tobacco: Never Used  Vaping Use  . Vaping Use: Never used  Substance Use Topics  . Alcohol use: Not Currently    Comment: occasional  . Drug use: No    Review of Systems Constitutional: Well appearing. Respiratory: Negative for dyspnea. Cardiovascular: Negative for change in skin temperature or color. Musculoskeletal:   Negative for chronic steroid use   Negative for trauma in the presence of osteoporosis  Negative for age over 13 and trauma.  Negative for constitutional symptoms, or history of cancer   Negative for pain worse at night. Skin: Negative for rash, lesion, or wound.  Genitourinary: Negative for urinary retention. Rectal: Negative for fecal incontinence or new onset constipation/bowel habit changes. Hematological/Immunilogical: Negative for immunosuppression, IV drug use, or fever Neurological: Positive for burning, tingling, numb, electric, radiating pain in the right lower extremity.                        Negative for saddle anesthesia.                        Negative for focal neurologic deficit, progressive symptoms.  Positive for disabling symptoms             Negative for saddle anesthesia. ____________________________________________   PHYSICAL EXAM:  VITAL SIGNS: ED Triage Vitals  Enc Vitals Group     BP 04/17/20 1233 120/64     Pulse Rate 04/17/20 1236 84     Resp 04/17/20 1236 18     Temp 04/17/20 1233 97.8 F (36.6 C)     Temp Source 04/17/20 1233 Oral     SpO2 04/17/20 1236 98 %     Weight 04/17/20 1235 181 lb  (82.1 kg)     Height 04/17/20 1235 5' 5.75" (1.67 m)     Head Circumference --      Peak Flow --      Pain Score 04/17/20 1235 8     Pain Loc --      Pain Edu? --      Excl. in GC? --     Constitutional: Alert and oriented. Well appearing and in no acute distress. Eyes: Conjunctivae are clear without discharge or drainage.  Head: Atraumatic. Neck: Full, active range of motion. Respiratory: Respirations even and unlabored. Musculoskeletal: Limited ROM of the back and extremities, Strength 5/5 of the lower extremities as tested. Neurologic: Reflexes of the lower extremities are 2+.  Positive straight leg raise on the right side. Skin: Atraumatic.  Psychiatric: Behavior and affect are normal.  ____________________________________________   LABS (all labs ordered are listed, but only abnormal results are displayed)  Labs Reviewed - No data to display ____________________________________________  RADIOLOGY  MRI of the lumbar spine does show a disc desiccation and height loss with mild broad disc bulge superimposed post far lateral/extraforaminal disc herniation.  As a result there is a moderate right foraminal stenosis with disc contacting the far lateral right L5 nerve. ____________________________________________   PROCEDURES  Procedure(s) performed:  Procedures ____________________________________________   INITIAL IMPRESSION / ASSESSMENT AND PLAN / ED COURSE  Tamrah Victorino is a 44 y.o. female presenting to the emergency department for treatment and evaluation of acute onset right lower back pain with radiation into the right lower extremity.  Exam is fairly reassuring with the exception of the extreme pain caused with any attempt to raise the lower extremity.  She is also endorsing some decrease in sensation in the right lower extremity as well.  Plan will be to get an MRI of the lumbar spine.  Pain medication has been ordered as well.  MRI does show compression of the L5  nerve.  Plan will be to try to get her pain controlled somewhat here and have her follow-up with neurosurgery.  She has been seeing physiatry for unrelated pain but does not currently have a neurosurgeon.  She will be given ER return precautions as well.  Medications will be sent to her pharmacy and a work note provided.  Medications  ketorolac (TORADOL) 30 MG/ML injection 15 mg (has no administration in time range)  tiZANidine (ZANAFLEX) tablet 8 mg (has no administration in time range)  oxyCODONE (Oxy IR/ROXICODONE) immediate release tablet 5 mg (has no administration in time range)  predniSONE (DELTASONE) tablet 60 mg (has no administration in time range)  morphine 4 MG/ML injection 4 mg (4 mg Intravenous Given 04/17/20 1355)  ondansetron (ZOFRAN) injection 4 mg (4 mg Intravenous Given 04/17/20 1354)  dexamethasone (DECADRON) injection 10 mg (10 mg Intravenous Given 04/17/20 1356)    ED Discharge Orders         Ordered    oxyCODONE-acetaminophen (PERCOCET) 10-325 MG tablet  Every 6 hours PRN        04/17/20 1538    predniSONE (STERAPRED UNI-PAK 21 TAB) 10 MG (21) TBPK tablet        04/17/20 1538    tiZANidine (ZANAFLEX) 4 MG tablet  3 times daily        04/17/20 1538           Pertinent labs & imaging results that were available during my care of the patient were reviewed by me and considered in my medical decision making (see chart for details).  _________________________________________   FINAL CLINICAL IMPRESSION(S) / ED DIAGNOSES  Final diagnoses:  Acute right-sided low back pain with right-sided sciatica     If controlled substance prescribed during this visit, 12 month history viewed on the [facility] prior to issuing an initial prescription for Schedule II or III opiod.   Chinita Pester, FNP 04/17/20 1609    Jene Every, MD 04/19/20 1213

## 2020-04-26 DIAGNOSIS — M5416 Radiculopathy, lumbar region: Secondary | ICD-10-CM | POA: Diagnosis not present

## 2020-04-26 DIAGNOSIS — M545 Low back pain, unspecified: Secondary | ICD-10-CM | POA: Diagnosis not present

## 2020-04-29 DIAGNOSIS — M5441 Lumbago with sciatica, right side: Secondary | ICD-10-CM | POA: Diagnosis not present

## 2020-04-29 DIAGNOSIS — M48061 Spinal stenosis, lumbar region without neurogenic claudication: Secondary | ICD-10-CM | POA: Diagnosis not present

## 2020-05-07 DIAGNOSIS — M5416 Radiculopathy, lumbar region: Secondary | ICD-10-CM | POA: Diagnosis not present

## 2020-05-10 ENCOUNTER — Other Ambulatory Visit: Payer: Self-pay

## 2020-05-10 ENCOUNTER — Ambulatory Visit (INDEPENDENT_AMBULATORY_CARE_PROVIDER_SITE_OTHER): Payer: BC Managed Care – PPO | Admitting: Internal Medicine

## 2020-05-10 VITALS — BP 120/72 | HR 110 | Temp 97.2°F | Wt 182.0 lb

## 2020-05-10 DIAGNOSIS — M5416 Radiculopathy, lumbar region: Secondary | ICD-10-CM

## 2020-05-10 NOTE — Progress Notes (Signed)
Subjective:    Patient ID: Morgan [name], female    DOB: 03-20-1976, 45 y.o.   MRN: [id number]  HPI  Pt presents to the clinic today with c/o low back pain that radiates into her groin. She reports associated numbness but denies tingling or weakness. She has been evaluated by Dr. Adriana Simas. MRI She would like a referral to Dr. Marikay Alar for a second opinion. MRI from 03/2020 showed:  IMPRESSION: 1. At L4-L5 there is a far lateral/extraforaminal disc protrusion which results in moderate right foraminal stenosis with disc contacting the far lateral/exited right L5 nerve. 2. Mild foraminal stenosis on the left at L2-L3 and L5-S1 with left foraminal disc protrusion at L2-L3. 3. No significant canal stenosis.  She has been treated with Prednisone, muscle relaxers, PT and back injections with minimal relief of symptoms. She denies loss of bowel or bladder control.  Review of Systems  Past Medical History:  Diagnosis Date  . Complication of anesthesia    STATES TOLD REINTUBATION WITH HYSTERECTOMY  . Difficult intubation   . GERD (gastroesophageal reflux disease)   . Headache    MIGRAINES  . History of hiatal hernia   . Hypertension   . PONV (postoperative nausea and vomiting)   . Stiff neck    mild limitations of up/down mvmt s/p neck surgery    Current Outpatient Medications  Medication Sig Dispense Refill  . albuterol (VENTOLIN HFA) 108 (90 Base) MCG/ACT inhaler INHALE 2 PUFFS INTO THE LUNGS EVERY 6 HOURS AS NEEDED FOR WHEEZING OR SHORTNESS OF BREATH 8.5 g 0  . Aspirin-Acetaminophen-Caffeine (GOODY HEADACHE PO) Take by mouth daily as needed.    Marland Kitchen atorvastatin (LIPITOR) 10 MG tablet TAKE 1 TABLET BY MOUTH  DAILY 90 tablet 0  . benazepril-hydrochlorthiazide (LOTENSIN HCT) 10-12.5 MG tablet TAKE 1 TABLET BY MOUTH  DAILY 90 tablet 0  . cyclobenzaprine (FLEXERIL) 10 MG tablet Take 2 tablets (20 mg total) by mouth at bedtime. 180 tablet 0  . omeprazole (PRILOSEC) 40 MG capsule Take 1  capsule (40 mg total) by mouth 2 (two) times daily. 180 capsule 3  . predniSONE (STERAPRED UNI-PAK 21 TAB) 10 MG (21) TBPK tablet Take 6 tablets on the first day and decrease by 1 tablet each day until finished. 21 tablet 0  . tiZANidine (ZANAFLEX) 4 MG tablet Take 1 tablet (4 mg total) by mouth 3 (three) times daily. 30 tablet 0  . valACYclovir (VALTREX) 500 MG tablet TAKE 1 TABLET BY MOUTH  DAILY 90 tablet 0   No current facility-administered medications for this visit.    Allergies  Allergen Reactions  . Adhesive [Tape] Other (See Comments)    Bruises skin  . Latex Other (See Comments)    Swelling (vaginal)  . Lexapro [Escitalopram Oxalate] Other (See Comments)    Loss taste  . Metoprolol Other (See Comments)    Lowers heart rate  . Topamax [Topiramate]     Loss taste    Family History  Problem Relation Age of Onset  . Arthritis Mother   . Arthritis Father   . Stroke Father   . Hypertension Father   . Heart disease Maternal Uncle   . Arthritis Maternal Grandmother   . Heart disease Maternal Grandmother   . Hypertension Maternal Grandmother   . Arthritis Maternal Grandfather   . Lung cancer Maternal Grandfather   . Heart disease Maternal Grandfather   . Arthritis Paternal Grandmother   . Heart disease Paternal Grandmother   . Hypertension Paternal Grandmother   .  Arthritis Paternal Grandfather   . Heart disease Paternal Grandfather   . Stroke Paternal Grandfather     Social History   Socioeconomic History  . Marital status: Married    Spouse name: Not on file  . Number of children: Not on file  . Years of education: Not on file  . Highest education level: Not on file  Occupational History  . Not on file  Tobacco Use  . Smoking status: Former Smoker    Quit date: 09/30/2016    Years since quitting: 3.6  . Smokeless tobacco: Never Used  Vaping Use  . Vaping Use: Never used  Substance and Sexual Activity  . Alcohol use: Not Currently    Comment: occasional   . Drug use: No  . Sexual activity: Yes  Other Topics Concern  . Not on file  Social History Narrative  . Not on file   Social Determinants of Health   Financial Resource Strain: Not on file  Food Insecurity: Not on file  Transportation Needs: Not on file  Physical Activity: Not on file  Stress: Not on file  Social Connections: Not on file  Intimate Partner Violence: Not on file     Constitutional: Denies fever, malaise, fatigue, headache or abrupt weight changes.  Respiratory: Denies difficulty breathing, shortness of breath, cough or sputum production.   Cardiovascular: Denies chest pain, chest tightness, palpitations or swelling in the hands or feet.  Gastrointestinal: Denies abdominal pain, bloating, constipation, diarrhea or blood in the stool.  GU: Denies urgency, frequency, pain with urination, burning sensation, blood in urine, odor or discharge. Musculoskeletal: Pt reports low back pain, right groin pain. Skin: Denies redness, rashes, lesions or ulcercations.  Neurological: Pt reports right leg numbness. Denies tingling, weakness or problems with balance and coordination.    No other specific complaints in a complete review of systems (except as listed in HPI above).     Objective:   Physical Exam  BP 120/72   Pulse (!) 110   Temp (!) 97.2 F (36.2 C) (Temporal)   Wt 182 lb (82.6 kg)   SpO2 98%   BMI 29.60 kg/m \ Wt Readings from Last 3 Encounters:  04/17/20 181 lb (82.1 kg)  05/16/19 181 lb (82.1 kg)  12/06/18 186 lb 5 oz (84.5 kg)    General: Appears her stated age, well developed, well nourished in NAD. Cardiovascular: Normal rate. Pulmonary/Chest: Normal effort. Musculoskeletal: Decreased flexion, extension, rotation to the left and lateral bending to the left. Normal rotation and lateral bending to the right. Bony tenderness noted over the lumbar spine. Limping gait. Neurological: Alert and oriented. Positive SLR on the right.   BMET     Component Value Date/Time   NA 142 10/10/2019 1349   K 3.9 10/10/2019 1349   CL 102 10/10/2019 1349   CO2 31 10/10/2019 1349   GLUCOSE 97 10/10/2019 1349   BUN 17 10/10/2019 1349   CREATININE 0.85 10/10/2019 1349   CREATININE 0.76 11/23/2016 1527   CALCIUM 9.4 10/10/2019 1349   GFRNONAA >60 09/30/2017 1334   GFRAA >60 09/30/2017 1334    Lipid Panel     Component Value Date/Time   CHOL 194 10/10/2019 1349   TRIG 141.0 10/10/2019 1349   HDL 59.40 10/10/2019 1349   CHOLHDL 3 10/10/2019 1349   VLDL 28.2 10/10/2019 1349   LDLCALC 106 (H) 10/10/2019 1349    CBC    Component Value Date/Time   WBC 8.0 05/16/2019 1453   RBC 4.65 05/16/2019  1453   HGB 11.9 (L) 05/16/2019 1453   HCT 37.2 05/16/2019 1453   PLT 334.0 05/16/2019 1453   MCV 80.1 05/16/2019 1453   MCH 27.1 09/30/2017 1334   MCHC 32.0 05/16/2019 1453   RDW 16.2 (H) 05/16/2019 1453   LYMPHSABS 2.7 12/06/2018 1624   MONOABS 0.8 12/06/2018 1624   EOSABS 0.4 12/06/2018 1624   BASOSABS 0.1 12/06/2018 1624    Hgb A1C No results found for: HGBA1C         Assessment & Plan:   Lumbar Radiculopathy:  MRI reviewed Referral to neurosurgery placed Encouraged heat/ice and stretching Continue muscle relaxers as needed  Return precautions discussed Nicki Reaper, NP This visit occurred during the SARS-CoV-2 public health emergency.  Safety protocols were in place, including screening questions prior to the visit, additional usage of staff PPE, and extensive cleaning of exam room while observing appropriate contact time as indicated for disinfecting solutions.

## 2020-05-12 ENCOUNTER — Encounter: Payer: Self-pay | Admitting: Internal Medicine

## 2020-05-12 NOTE — Patient Instructions (Signed)
Sciatica  Sciatica is pain, weakness, tingling, or loss of feeling (numbness) along the sciatic nerve. The sciatic nerve starts in the lower back and goes down the back of each leg. Sciatica usually goes away on its own or with treatment. Sometimes, sciatica may come back (recur). What are the causes? This condition happens when the sciatic nerve is pinched or has pressure put on it. This may be the result of:  A disk in between the bones of the spine bulging out too far (herniated disk).  Changes in the spinal disks that occur with aging.  A condition that affects a muscle in the butt.  Extra bone growth near the sciatic nerve.  A break (fracture) of the area between your hip bones (pelvis).  Pregnancy.  Tumor. This is rare. What increases the risk? You are more likely to develop this condition if you:  Play sports that put pressure or stress on the spine.  Have poor strength and ease of movement (flexibility).  Have had a back injury in the past.  Have had back surgery.  Sit for long periods of time.  Do activities that involve bending or lifting over and over again.  Are very overweight (obese). What are the signs or symptoms? Symptoms can vary from mild to very bad. They may include:  Any of these problems in the lower back, leg, hip, or butt: ? Mild tingling, loss of feeling, or dull aches. ? Burning sensations. ? Sharp pains.  Loss of feeling in the back of the calf or the sole of the foot.  Leg weakness.  Very bad back pain that makes it hard to move. These symptoms may get worse when you cough, sneeze, or laugh. They may also get worse when you sit or stand for long periods of time. How is this treated? This condition often gets better without any treatment. However, treatment may include:  Changing or cutting back on physical activity when you have pain.  Doing exercises and stretching.  Putting ice or heat on the affected area.  Medicines that  help: ? To relieve pain and swelling. ? To relax your muscles.  Shots (injections) of medicines that help to relieve pain, irritation, and swelling.  Surgery. Follow these instructions at home: Medicines  Take over-the-counter and prescription medicines only as told by your doctor.  Ask your doctor if the medicine prescribed to you: ? Requires you to avoid driving or using heavy machinery. ? Can cause trouble pooping (constipation). You may need to take these steps to prevent or treat trouble pooping:  Drink enough fluids to keep your pee (urine) pale yellow.  Take over-the-counter or prescription medicines.  Eat foods that are high in fiber. These include beans, whole grains, and fresh fruits and vegetables.  Limit foods that are high in fat and sugar. These include fried or sweet foods. Managing pain  If told, put ice on the affected area. ? Put ice in a plastic bag. ? Place a towel between your skin and the bag. ? Leave the ice on for 20 minutes, 2-3 times a day.  If told, put heat on the affected area. Use the heat source that your doctor tells you to use, such as a moist heat pack or a heating pad. ? Place a towel between your skin and the heat source. ? Leave the heat on for 20-30 minutes. ? Remove the heat if your skin turns bright red. This is very important if you are unable to feel pain,   heat, or cold. You may have a greater risk of getting burned.      Activity  Return to your normal activities as told by your doctor. Ask your doctor what activities are safe for you.  Avoid activities that make your symptoms worse.  Take short rests during the day. ? When you rest for a long time, do some physical activity or stretching between periods of rest. ? Avoid sitting for a long time without moving. Get up and move around at least one time each hour.  Exercise and stretch regularly, as told by your doctor.  Do not lift anything that is heavier than 10 lb (4.5 kg)  while you have symptoms of sciatica. ? Avoid lifting heavy things even when you do not have symptoms. ? Avoid lifting heavy things over and over.  When you lift objects, always lift in a way that is safe for your body. To do this, you should: ? Bend your knees. ? Keep the object close to your body. ? Avoid twisting.   General instructions  Stay at a healthy weight.  Wear comfortable shoes that support your feet. Avoid wearing high heels.  Avoid sleeping on a mattress that is too soft or too hard. You might have less pain if you sleep on a mattress that is firm enough to support your back.  Keep all follow-up visits as told by your doctor. This is important. Contact a doctor if:  You have pain that: ? Wakes you up when you are sleeping. ? Gets worse when you lie down. ? Is worse than the pain you have had in the past. ? Lasts longer than 4 weeks.  You lose weight without trying. Get help right away if:  You cannot control when you pee (urinate) or poop (have a bowel movement).  You have weakness in any of these areas and it gets worse: ? Lower back. ? The area between your hip bones. ? Butt. ? Legs.  You have redness or swelling of your back.  You have a burning feeling when you pee. Summary  Sciatica is pain, weakness, tingling, or loss of feeling (numbness) along the sciatic nerve.  This condition happens when the sciatic nerve is pinched or has pressure put on it.  Sciatica can cause pain, tingling, or loss of feeling (numbness) in the lower back, legs, hips, and butt.  Treatment often includes rest, exercise, medicines, and putting ice or heat on the affected area. This information is not intended to replace advice given to you by your health care provider. Make sure you discuss any questions you have with your health care provider. Document Revised: 04/25/2018 Document Reviewed: 04/25/2018 Elsevier Patient Education  2021 [employer]  

## 2020-05-13 ENCOUNTER — Encounter: Payer: Self-pay | Admitting: Internal Medicine

## 2020-05-17 ENCOUNTER — Ambulatory Visit (INDEPENDENT_AMBULATORY_CARE_PROVIDER_SITE_OTHER): Payer: BC Managed Care – PPO | Admitting: Internal Medicine

## 2020-05-17 ENCOUNTER — Encounter: Payer: Self-pay | Admitting: Internal Medicine

## 2020-05-17 ENCOUNTER — Other Ambulatory Visit: Payer: Self-pay

## 2020-05-17 VITALS — BP 124/78 | HR 110 | Temp 97.3°F | Ht 65.75 in | Wt 182.0 lb

## 2020-05-17 DIAGNOSIS — Z23 Encounter for immunization: Secondary | ICD-10-CM

## 2020-05-17 DIAGNOSIS — Z1159 Encounter for screening for other viral diseases: Secondary | ICD-10-CM | POA: Diagnosis not present

## 2020-05-17 DIAGNOSIS — R519 Headache, unspecified: Secondary | ICD-10-CM | POA: Insufficient documentation

## 2020-05-17 DIAGNOSIS — E782 Mixed hyperlipidemia: Secondary | ICD-10-CM

## 2020-05-17 DIAGNOSIS — N3281 Overactive bladder: Secondary | ICD-10-CM

## 2020-05-17 DIAGNOSIS — M542 Cervicalgia: Secondary | ICD-10-CM

## 2020-05-17 DIAGNOSIS — B001 Herpesviral vesicular dermatitis: Secondary | ICD-10-CM

## 2020-05-17 DIAGNOSIS — K2100 Gastro-esophageal reflux disease with esophagitis, without bleeding: Secondary | ICD-10-CM

## 2020-05-17 DIAGNOSIS — I10 Essential (primary) hypertension: Secondary | ICD-10-CM

## 2020-05-17 DIAGNOSIS — Z0001 Encounter for general adult medical examination with abnormal findings: Secondary | ICD-10-CM | POA: Diagnosis not present

## 2020-05-17 DIAGNOSIS — G8929 Other chronic pain: Secondary | ICD-10-CM

## 2020-05-17 DIAGNOSIS — M5416 Radiculopathy, lumbar region: Secondary | ICD-10-CM

## 2020-05-17 DIAGNOSIS — Z114 Encounter for screening for human immunodeficiency virus [HIV]: Secondary | ICD-10-CM | POA: Diagnosis not present

## 2020-05-17 DIAGNOSIS — E010 Iodine-deficiency related diffuse (endemic) goiter: Secondary | ICD-10-CM

## 2020-05-17 NOTE — Assessment & Plan Note (Signed)
Controlled on benazepril HCT C met today Reinforced DASH diet and exercise weight loss

## 2020-05-17 NOTE — Assessment & Plan Note (Signed)
We will continue suppressive Valtrex

## 2020-05-17 NOTE — Assessment & Plan Note (Signed)
Currently not an issue off meds She will continue to follow with GYN

## 2020-05-17 NOTE — Assessment & Plan Note (Signed)
She declines Omeprazole wean secondary to hiatal hernia CBC and c-Met today We will monitor

## 2020-05-17 NOTE — Addendum Note (Signed)
Addended by: Roena Malady on: 05/17/2020 04:29 PM   Modules accepted: Orders

## 2020-05-17 NOTE — Patient Instructions (Signed)
Health Maintenance, Female Adopting a healthy lifestyle and getting preventive care are important in promoting health and wellness. Ask your health care provider about:  The right schedule for you to have regular tests and exams.  Things you can do on your own to prevent diseases and keep yourself healthy. What should I know about diet, weight, and exercise? Eat a healthy diet  Eat a diet that includes plenty of vegetables, fruits, low-fat dairy products, and lean protein.  Do not eat a lot of foods that are high in solid fats, added sugars, or sodium.   Maintain a healthy weight Body mass index (BMI) is used to identify weight problems. It estimates body fat based on height and weight. Your health care provider can help determine your BMI and help you achieve or maintain a healthy weight. Get regular exercise Get regular exercise. This is one of the most important things you can do for your health. Most adults should:  Exercise for at least 150 minutes each week. The exercise should increase your heart rate and make you sweat (moderate-intensity exercise).  Do strengthening exercises at least twice a week. This is in addition to the moderate-intensity exercise.  Spend less time sitting. Even light physical activity can be beneficial. Watch cholesterol and blood lipids Have your blood tested for lipids and cholesterol at 43 years of age, then have this test every 5 years. Have your cholesterol levels checked more often if:  Your lipid or cholesterol levels are high.  You are older than 43 years of age.  You are at high risk for heart disease. What should I know about cancer screening? Depending on your health history and family history, you may need to have cancer screening at various ages. This may include screening for:  Breast cancer.  Cervical cancer.  Colorectal cancer.  Skin cancer.  Lung cancer. What should I know about heart disease, diabetes, and high blood  pressure? Blood pressure and heart disease  High blood pressure causes heart disease and increases the risk of stroke. This is more likely to develop in people who have high blood pressure readings, are of African descent, or are overweight.  Have your blood pressure checked: ? Every 3-5 years if you are 18-39 years of age. ? Every year if you are 40 years old or older. Diabetes Have regular diabetes screenings. This checks your fasting blood sugar level. Have the screening done:  Once every three years after age 40 if you are at a normal weight and have a low risk for diabetes.  More often and at a younger age if you are overweight or have a high risk for diabetes. What should I know about preventing infection? Hepatitis B If you have a higher risk for hepatitis B, you should be screened for this virus. Talk with your health care provider to find out if you are at risk for hepatitis B infection. Hepatitis C Testing is recommended for:  Everyone born from 1945 through 1965.  Anyone with known risk factors for hepatitis C. Sexually transmitted infections (STIs)  Get screened for STIs, including gonorrhea and chlamydia, if: ? You are sexually active and are younger than 43 years of age. ? You are older than 43 years of age and your health care provider tells you that you are at risk for this type of infection. ? Your sexual activity has changed since you were last screened, and you are at increased risk for chlamydia or gonorrhea. Ask your health care provider   if you are at risk.  Ask your health care provider about whether you are at high risk for HIV. Your health care provider may recommend a prescription medicine to help prevent HIV infection. If you choose to take medicine to prevent HIV, you should first get tested for HIV. You should then be tested every 3 months for as long as you are taking the medicine. Pregnancy  If you are about to stop having your period (premenopausal) and  you may become pregnant, seek counseling before you get pregnant.  Take 400 to 800 micrograms (mcg) of folic acid every day if you become pregnant.  Ask for birth control (contraception) if you want to prevent pregnancy. Osteoporosis and menopause Osteoporosis is a disease in which the bones lose minerals and strength with aging. This can result in bone fractures. If you are 65 years old or older, or if you are at risk for osteoporosis and fractures, ask your health care provider if you should:  Be screened for bone loss.  Take a calcium or vitamin D supplement to lower your risk of fractures.  Be given hormone replacement therapy (HRT) to treat symptoms of menopause. Follow these instructions at home: Lifestyle  Do not use any products that contain nicotine or tobacco, such as cigarettes, e-cigarettes, and chewing tobacco. If you need help quitting, ask your health care provider.  Do not use street drugs.  Do not share needles.  Ask your health care provider for help if you need support or information about quitting drugs. Alcohol use  Do not drink alcohol if: ? Your health care provider tells you not to drink. ? You are pregnant, may be pregnant, or are planning to become pregnant.  If you drink alcohol: ? Limit how much you use to 0-1 drink a day. ? Limit intake if you are breastfeeding.  Be aware of how much alcohol is in your drink. In the [place] one drink equals one 12 oz bottle of beer (355 mL), one 5 oz glass of wine (148 mL), or one 1 oz glass of hard liquor (44 mL). General instructions  Schedule regular health, dental, and eye exams.  Stay current with your vaccines.  Tell your health care provider if: ? You often feel depressed. ? You have ever been abused or do not feel safe at home. Summary  Adopting a healthy lifestyle and getting preventive care are important in promoting health and wellness.  Follow your health care provider's instructions about healthy  diet, exercising, and getting tested or screened for diseases.  Follow your health care provider's instructions on monitoring your cholesterol and blood pressure. This information is not intended to replace advice given to you by your health care provider. Make sure you discuss any questions you have with your health care provider. Document Revised: 03/30/2018 Document Reviewed: 03/30/2018 Elsevier Patient Education  2021 [employer]  

## 2020-05-17 NOTE — Assessment & Plan Note (Signed)
Managed with Nortriptyline and Seroquel prescribed by neurology We will monitor

## 2020-05-17 NOTE — Assessment & Plan Note (Signed)
C-Met and lipid profile today Encouraged her to consume a low-fat diet Continue Atorvastatin will adjust if needed based on labs

## 2020-05-17 NOTE — Assessment & Plan Note (Signed)
Continue Gabapentin, Ibuprofen and Flexeril as needed She has a second opinion scheduled with Dr. Yetta Barre in February

## 2020-05-17 NOTE — Assessment & Plan Note (Signed)
Continue Gabapentin, Ibuprofen and Flexeril Encouraged daily stretching

## 2020-05-17 NOTE — Progress Notes (Signed)
Subjective:    Patient ID: Morgan [name], female    DOB: 1975-05-13, 45 y.o.   MRN: [id number]  HPI  Patient presents the clinic today for her annual exam.  She is also due to follow-up chronic conditions.  Chronic Neck Pain: She reports some improvement after recent steroid injection.  Otherwise, managed with Gabapentin, Ibuprofen and Flexeril.  HTN: Her BP today is 124/78.  She is taking Benazepril HCT as prescribed.  ECG from 09/2017 reviewed.  GERD: She recently realized carbonation is a trigger.  She denies breakthrough on Omeprazole, history of hiatal hernia.  There is no upper GI on file.  OAB: Currently managed without medication.  She follows with GYN for this.  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 She follows with neurology.  Lumbar Radiculopathy: She has a second opinion with Dr. Ronnald Ramp scheduled for February. Managed with Gabapentin, Ibuprofen and Flexeril.  HLD: Her last LDL was 106, 09/2019. She denies myalgias on Atorvastatin. She does not consume a low fat diet.  Flu: 01/2020 Tetanus: >10 years ago Covid: [place] x 2 Pap smear: Partial hysterectomy Mammogram: 10/2018 Vision screening: as needed Dentist: Biannually  Diet: She does eat meat. She consumes fruits and veggies daily. She does eat some fried foods. She drinks mostly water. Exercise: None  Review of Systems  Past Medical History:  Diagnosis Date  . Complication of anesthesia    STATES TOLD REINTUBATION WITH HYSTERECTOMY  . Difficult intubation   . GERD (gastroesophageal reflux disease)   . Headache    MIGRAINES  . History of hiatal hernia   . Hypertension   . PONV (postoperative nausea and vomiting)   . Stiff neck    mild limitations of up/down mvmt s/p neck surgery    Current Outpatient Medications  Medication Sig Dispense Refill  . albuterol (VENTOLIN HFA) 108 (90 Base) MCG/ACT inhaler INHALE 2 PUFFS  INTO THE LUNGS EVERY 6 HOURS AS NEEDED FOR WHEEZING OR SHORTNESS OF BREATH 8.5 g 0  . Aspirin-Acetaminophen-Caffeine (GOODY HEADACHE PO) Take by mouth daily as needed.    Marland Kitchen atorvastatin (LIPITOR) 10 MG tablet TAKE 1 TABLET BY MOUTH  DAILY 90 tablet 0  . benazepril-hydrochlorthiazide (LOTENSIN HCT) 10-12.5 MG tablet TAKE 1 TABLET BY MOUTH  DAILY 90 tablet 0  . cyclobenzaprine (FLEXERIL) 10 MG tablet Take 2 tablets (20 mg total) by mouth at bedtime. 180 tablet 0  . gabapentin (NEURONTIN) 600 MG tablet Take 2,400 mg by mouth.    . nortriptyline (PAMELOR) 10 MG capsule TAKE 3 CAPSULES BY MOUTH AT NIGHT FOR 1 WEEK. INCREASE TO 40 MG AT NIGHT AND. CONTINUE THAT DOSE    . omeprazole (PRILOSEC) 40 MG capsule Take 1 capsule (40 mg total) by mouth 2 (two) times daily. 180 capsule 3  . predniSONE (STERAPRED UNI-PAK 21 TAB) 10 MG (21) TBPK tablet Take 6 tablets on the first day and decrease by 1 tablet each day until finished. 21 tablet 0  . QUEtiapine (SEROQUEL) 25 MG tablet Take by mouth.    Marland Kitchen tiZANidine (ZANAFLEX) 4 MG tablet Take 1 tablet (4 mg total) by mouth 3 (three) times daily. 30 tablet 0  . valACYclovir (VALTREX) 500 MG tablet TAKE 1 TABLET BY MOUTH  DAILY 90 tablet 0   No current facility-administered medications for this visit.    Allergies  Allergen Reactions  . Adhesive [Tape] Other (See Comments)    Bruises skin  . Latex Other (See  Comments)    Swelling (vaginal)  . Lexapro [Escitalopram Oxalate] Other (See Comments)    Loss taste  . Metoprolol Other (See Comments)    Lowers heart rate  . Topamax [Topiramate]     Loss taste    Family History  Problem Relation Age of Onset  . Arthritis Mother   . Arthritis Father   . Stroke Father   . Hypertension Father   . Heart disease Maternal Uncle   . Arthritis Maternal Grandmother   . Heart disease Maternal Grandmother   . Hypertension Maternal Grandmother   . Arthritis Maternal Grandfather   . Lung cancer Maternal Grandfather   .  Heart disease Maternal Grandfather   . Arthritis Paternal Grandmother   . Heart disease Paternal Grandmother   . Hypertension Paternal Grandmother   . Arthritis Paternal Grandfather   . Heart disease Paternal Grandfather   . Stroke Paternal Grandfather     Social History   Socioeconomic History  . Marital status: Married    Spouse name: Not on file  . Number of children: Not on file  . Years of education: Not on file  . Highest education level: Not on file  Occupational History  . Not on file  Tobacco Use  . Smoking status: Former Smoker    Quit date: 09/30/2016    Years since quitting: 3.6  . Smokeless tobacco: Never Used  Vaping Use  . Vaping Use: Never used  Substance and Sexual Activity  . Alcohol use: Not Currently    Comment: occasional  . Drug use: No  . Sexual activity: Yes  Other Topics Concern  . Not on file  Social History Narrative  . Not on file   Social Determinants of Health   Financial Resource Strain: Not on file  Food Insecurity: Not on file  Transportation Needs: Not on file  Physical Activity: Not on file  Stress: Not on file  Social Connections: Not on file  Intimate Partner Violence: Not on file     Constitutional: Patient reports frequent headaches. Denies fever, malaise, fatigue,or abrupt weight changes.  HEENT: Denies eye pain, eye redness, ear pain, ringing in the ears, wax buildup, runny nose, nasal congestion, bloody nose, or sore throat. Respiratory: Denies difficulty breathing, shortness of breath, cough or sputum production.   Cardiovascular: Denies chest pain, chest tightness, palpitations or swelling in the hands or feet.  Gastrointestinal: Denies abdominal pain, bloating, constipation, diarrhea or blood in the stool.  GU: Patient reports urinary frequency.  Denies urgency, pain with urination, burning sensation, blood in urine, odor or discharge. Musculoskeletal: Patient reports chronic neck and back pain.  Denies difficulty with  gait, or joint swelling.  Skin: Denies redness, rashes, lesions or ulcercations.  Neurological: Patient reports numbness and tingling in her right leg.  Denies dizziness, difficulty with memory, difficulty with speech or problems with balance and coordination.  Psych: Denies anxiety, depression, SI/HI.  No other specific complaints in a complete review of systems (except as listed in HPI above).     Objective:   Physical Exam   BP 124/78   Pulse (!) 110   Temp (!) 97.3 F (36.3 C) (Temporal)   Ht 5' 5.75" (1.67 m)   Wt 182 lb (82.6 kg)   SpO2 98%   BMI 29.60 kg/m   Wt Readings from Last 3 Encounters:  05/10/20 182 lb (82.6 kg)  04/17/20 181 lb (82.1 kg)  05/16/19 181 lb (82.1 kg)    General: Appears her stated age,  well developed, well nourished in NAD. Skin: Warm, dry and intact. No rashes, lesions or ulcerations noted. HEENT: Head: normal shape and size; Eyes: sclera white, no icterus, conjunctiva pink, PERRLA and EOMs intact;  Neck:  Neck supple, trachea midline. No masses, lumps present. Thyromegaly noted. Cardiovascular: Tachycardic with normal rhythm. S1,S2 noted.  No murmur, rubs or gallops noted. No JVD or BLE edema.  Pulmonary/Chest: Normal effort and positive vesicular breath sounds. No respiratory distress. No wheezes, rales or ronchi noted.  Abdomen: Soft and nontender. Normal bowel sounds. No distention or masses noted. Liver, spleen and kidneys non palpable. Musculoskeletal: Strength 5/5 BUE/ Strength 4/5 RLE, 5/5 LLE. Limping gait. Neurological: Alert and oriented. Cranial nerves II-XII grossly intact. Coordination normal.  Psychiatric: Mood and affect normal. Behavior is normal. Judgment and thought content normal.     BMET    Component Value Date/Time   NA 142 10/10/2019 1349   K 3.9 10/10/2019 1349   CL 102 10/10/2019 1349   CO2 31 10/10/2019 1349   GLUCOSE 97 10/10/2019 1349   BUN 17 10/10/2019 1349   CREATININE 0.85 10/10/2019 1349   CREATININE  0.76 11/23/2016 1527   CALCIUM 9.4 10/10/2019 1349   GFRNONAA >60 09/30/2017 1334   GFRAA >60 09/30/2017 1334    Lipid Panel     Component Value Date/Time   CHOL 194 10/10/2019 1349   TRIG 141.0 10/10/2019 1349   HDL 59.40 10/10/2019 1349   CHOLHDL 3 10/10/2019 1349   VLDL 28.2 10/10/2019 1349   LDLCALC 106 (H) 10/10/2019 1349    CBC    Component Value Date/Time   WBC 8.0 05/16/2019 1453   RBC 4.65 05/16/2019 1453   HGB 11.9 (L) 05/16/2019 1453   HCT 37.2 05/16/2019 1453   PLT 334.0 05/16/2019 1453   MCV 80.1 05/16/2019 1453   MCH 27.1 09/30/2017 1334   MCHC 32.0 05/16/2019 1453   RDW 16.2 (H) 05/16/2019 1453   LYMPHSABS 2.7 12/06/2018 1624   MONOABS 0.8 12/06/2018 1624   EOSABS 0.4 12/06/2018 1624   BASOSABS 0.1 12/06/2018 1624    Hgb A1C No results found for: HGBA1C         Assessment & Plan:   Preventative Health Maintenance:  Flu shot UTD Tetanus today Encouraged her to get her third COVID booster She no longer needs Pap smears She will do her mammogram when she decides about surgical intervention for her right lower extremity Encouraged her to consume a balanced diet exercise regimen Advised her to see an eye doctor and dentist annually We will check CBC, c-Met, TSH, lipid, A1c, HIV and hep C today  RTC in 1 year, sooner if needed  Webb Silversmith, NP This visit occurred during the SARS-CoV-2 public health emergency.  Safety protocols were in place, including screening questions prior to the visit, additional usage of staff PPE, and extensive cleaning of exam room while observing appropriate contact time as indicated for disinfecting solutions.

## 2020-05-18 LAB — TSH: TSH: 2.35 mIU/L

## 2020-05-18 LAB — COMPREHENSIVE METABOLIC PANEL
AG Ratio: 1.7 (calc) (ref 1.0–2.5)
ALT: 12 U/L (ref 6–29)
AST: 14 U/L (ref 10–30)
Albumin: 4.3 g/dL (ref 3.6–5.1)
Alkaline phosphatase (APISO): 101 U/L (ref 31–125)
BUN: 12 mg/dL (ref 7–25)
CO2: 31 mmol/L (ref 20–32)
Calcium: 9.5 mg/dL (ref 8.6–10.2)
Chloride: 101 mmol/L (ref 98–110)
Creat: 0.93 mg/dL (ref 0.50–1.10)
Globulin: 2.6 g/dL (calc) (ref 1.9–3.7)
Glucose, Bld: 96 mg/dL (ref 65–99)
Potassium: 3.8 mmol/L (ref 3.5–5.3)
Sodium: 141 mmol/L (ref 135–146)
Total Bilirubin: 0.2 mg/dL (ref 0.2–1.2)
Total Protein: 6.9 g/dL (ref 6.1–8.1)

## 2020-05-18 LAB — HEMOGLOBIN A1C
Hgb A1c MFr Bld: 6.2 % of total Hgb — ABNORMAL HIGH (ref <5.7)
Mean Plasma Glucose: 131 mg/dL
eAG (mmol/L): 7.3 mmol/L

## 2020-05-18 LAB — LIPID PANEL
Cholesterol: 232 mg/dL — ABNORMAL HIGH (ref <200)
HDL: 68 mg/dL (ref >=50)
LDL Cholesterol (Calc): 137 mg/dL (calc) — ABNORMAL HIGH
Non-HDL Cholesterol (Calc): 164 mg/dL (calc) — ABNORMAL HIGH (ref <130)
Total CHOL/HDL Ratio: 3.4 (calc) (ref <5.0)
Triglycerides: 144 mg/dL (ref <150)

## 2020-05-18 LAB — CBC
HCT: 38.8 % (ref 35.0–45.0)
Hemoglobin: 12.8 g/dL (ref 11.7–15.5)
MCH: 27.3 pg (ref 27.0–33.0)
MCHC: 33 g/dL (ref 32.0–36.0)
MCV: 82.7 fL (ref 80.0–100.0)
MPV: 10.7 fL (ref 7.5–12.5)
Platelets: 367 10*3/uL (ref 140–400)
RBC: 4.69 10*6/uL (ref 3.80–5.10)
RDW: 14.8 % (ref 11.0–15.0)
WBC: 8.4 10*3/uL (ref 3.8–10.8)

## 2020-05-20 LAB — HEPATITIS C ANTIBODY
Hepatitis C Ab: NONREACTIVE
SIGNAL TO CUT-OFF: 0.01 (ref <1.00)

## 2020-05-20 LAB — HIV ANTIBODY (ROUTINE TESTING W REFLEX): HIV 1&2 Ab, 4th Generation: NONREACTIVE

## 2020-05-21 DIAGNOSIS — M5126 Other intervertebral disc displacement, lumbar region: Secondary | ICD-10-CM | POA: Diagnosis not present

## 2020-05-22 ENCOUNTER — Encounter: Payer: Self-pay | Admitting: Internal Medicine

## 2020-05-24 DIAGNOSIS — M5126 Other intervertebral disc displacement, lumbar region: Secondary | ICD-10-CM | POA: Diagnosis not present

## 2020-05-24 DIAGNOSIS — M5116 Intervertebral disc disorders with radiculopathy, lumbar region: Secondary | ICD-10-CM | POA: Diagnosis not present

## 2020-05-26 ENCOUNTER — Other Ambulatory Visit: Payer: Self-pay | Admitting: Internal Medicine

## 2020-06-01 ENCOUNTER — Other Ambulatory Visit: Payer: Self-pay | Admitting: Internal Medicine

## 2020-06-01 DIAGNOSIS — I10 Essential (primary) hypertension: Secondary | ICD-10-CM

## 2020-06-04 ENCOUNTER — Other Ambulatory Visit: Payer: Self-pay | Admitting: Internal Medicine

## 2020-06-06 ENCOUNTER — Other Ambulatory Visit: Payer: Self-pay | Admitting: Internal Medicine

## 2020-08-04 ENCOUNTER — Encounter: Payer: Self-pay | Admitting: Internal Medicine

## 2020-08-07 MED ORDER — GABAPENTIN 300 MG PO CAPS: 300.0000 mg | ORAL_CAPSULE | Freq: Two times a day (BID) | ORAL | 1 refills | Status: DC

## 2020-08-20 DIAGNOSIS — R1900 Intra-abdominal and pelvic swelling, mass and lump, unspecified site: Secondary | ICD-10-CM | POA: Diagnosis not present

## 2020-08-20 DIAGNOSIS — R937 Abnormal findings on diagnostic imaging of other parts of musculoskeletal system: Secondary | ICD-10-CM | POA: Diagnosis not present

## 2020-08-22 ENCOUNTER — Other Ambulatory Visit: Payer: Self-pay | Admitting: Internal Medicine

## 2020-09-17 DIAGNOSIS — R1900 Intra-abdominal and pelvic swelling, mass and lump, unspecified site: Secondary | ICD-10-CM | POA: Diagnosis not present

## 2020-09-19 DIAGNOSIS — M542 Cervicalgia: Secondary | ICD-10-CM | POA: Diagnosis not present

## 2020-09-19 DIAGNOSIS — M25511 Pain in right shoulder: Secondary | ICD-10-CM | POA: Diagnosis not present

## 2020-09-19 DIAGNOSIS — M25531 Pain in right wrist: Secondary | ICD-10-CM | POA: Diagnosis not present

## 2020-10-07 DIAGNOSIS — R519 Headache, unspecified: Secondary | ICD-10-CM | POA: Diagnosis not present

## 2020-10-07 DIAGNOSIS — R413 Other amnesia: Secondary | ICD-10-CM | POA: Diagnosis not present

## 2020-10-07 DIAGNOSIS — G479 Sleep disorder, unspecified: Secondary | ICD-10-CM | POA: Diagnosis not present

## 2020-10-07 DIAGNOSIS — M542 Cervicalgia: Secondary | ICD-10-CM | POA: Diagnosis not present

## 2020-10-09 ENCOUNTER — Telehealth: Payer: Self-pay

## 2020-10-09 NOTE — Telephone Encounter (Signed)
Morgan [name] Morgan [name] - PA Case ID: [id number] Need help? Call us at [phone] Status Sent to [facility] Omeprazole 40MG  dr capsules Form [facility] Electronic Prior Authorization Form [phone] NCPDP)

## 2020-11-21 DIAGNOSIS — D225 Melanocytic nevi of trunk: Secondary | ICD-10-CM | POA: Diagnosis not present

## 2020-11-21 DIAGNOSIS — L821 Other seborrheic keratosis: Secondary | ICD-10-CM | POA: Diagnosis not present

## 2020-11-21 DIAGNOSIS — L820 Inflamed seborrheic keratosis: Secondary | ICD-10-CM | POA: Diagnosis not present

## 2020-12-24 DIAGNOSIS — R519 Headache, unspecified: Secondary | ICD-10-CM | POA: Diagnosis not present

## 2020-12-24 DIAGNOSIS — G479 Sleep disorder, unspecified: Secondary | ICD-10-CM | POA: Diagnosis not present

## 2020-12-24 DIAGNOSIS — M542 Cervicalgia: Secondary | ICD-10-CM | POA: Diagnosis not present

## 2020-12-24 DIAGNOSIS — R413 Other amnesia: Secondary | ICD-10-CM | POA: Diagnosis not present

## 2021-01-06 ENCOUNTER — Ambulatory Visit (INDEPENDENT_AMBULATORY_CARE_PROVIDER_SITE_OTHER): Payer: BC Managed Care – PPO | Admitting: Nurse Practitioner

## 2021-01-06 ENCOUNTER — Other Ambulatory Visit: Payer: Self-pay

## 2021-01-06 ENCOUNTER — Encounter: Payer: Self-pay | Admitting: Nurse Practitioner

## 2021-01-06 VITALS — BP 128/76 | HR 96 | Temp 98.0°F | Resp 12 | Ht 65.75 in | Wt 172.4 lb

## 2021-01-06 DIAGNOSIS — M542 Cervicalgia: Secondary | ICD-10-CM

## 2021-01-06 DIAGNOSIS — R519 Headache, unspecified: Secondary | ICD-10-CM

## 2021-01-06 DIAGNOSIS — K2100 Gastro-esophageal reflux disease with esophagitis, without bleeding: Secondary | ICD-10-CM | POA: Diagnosis not present

## 2021-01-06 DIAGNOSIS — B001 Herpesviral vesicular dermatitis: Secondary | ICD-10-CM | POA: Diagnosis not present

## 2021-01-06 DIAGNOSIS — M5416 Radiculopathy, lumbar region: Secondary | ICD-10-CM | POA: Diagnosis not present

## 2021-01-06 DIAGNOSIS — E782 Mixed hyperlipidemia: Secondary | ICD-10-CM

## 2021-01-06 DIAGNOSIS — G8929 Other chronic pain: Secondary | ICD-10-CM

## 2021-01-06 DIAGNOSIS — R7303 Prediabetes: Secondary | ICD-10-CM | POA: Insufficient documentation

## 2021-01-06 DIAGNOSIS — I10 Essential (primary) hypertension: Secondary | ICD-10-CM | POA: Diagnosis not present

## 2021-01-06 MED ORDER — GABAPENTIN 300 MG PO CAPS: 300.0000 mg | ORAL_CAPSULE | Freq: Two times a day (BID) | ORAL | 1 refills | Status: DC

## 2021-01-06 MED ORDER — ATORVASTATIN CALCIUM 10 MG PO TABS: 10.0000 mg | ORAL_TABLET | Freq: Every day | ORAL | 3 refills | Status: DC

## 2021-01-06 MED ORDER — BENAZEPRIL-HYDROCHLOROTHIAZIDE 10-12.5 MG PO TABS: 1.0000 | ORAL_TABLET | Freq: Every day | ORAL | 3 refills | Status: DC

## 2021-01-06 MED ORDER — VALACYCLOVIR HCL 500 MG PO TABS: 500.0000 mg | ORAL_TABLET | Freq: Every day | ORAL | 3 refills | Status: DC

## 2021-01-06 MED ORDER — CYCLOBENZAPRINE HCL 10 MG PO TABS: 20.0000 mg | ORAL_TABLET | Freq: Every day | ORAL | 1 refills | Status: DC

## 2021-01-06 NOTE — Assessment & Plan Note (Signed)
Has been evaluated by GI.  Had an upper endoscopy 03/2018.  Recommended continuing PPI.  Patient on omeprazole 40 mg twice daily.  Continue.

## 2021-01-06 NOTE — Progress Notes (Signed)
Established Patient Office Visit  Subjective:  Patient ID: Morgan [name], female    DOB: April 15, 1976  Age: 45 y.o. MRN: [id number]  CC:  Chief Complaint  Patient presents with   Transfer of Care   medication managements    HPI Morgan [name] presents for TOC  HTN: When she gets headaches or feels off. She will check it. Walks a lot at work. Does go up and down stairs at home. Eats once daily  GERD: on Omeprazole 40mg  twice daily. Avoids spicey foods. Has been evaluated by GI. Had endoscopy 03/2018.  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 Continue  Lumbar radiculopathy: prior c-fusion C5-C7. Takes 20mg  flexeril at bedtime and gabapentin 300 BID  HLD: Currently on Atorvastatin 10mg  daily.   HA: Followed by neurology currently on nortriptyline, quetiapine, rizatriptan, venlafaxine.  Past Medical History:  Diagnosis Date   Complication of anesthesia    STATES TOLD REINTUBATION WITH HYSTERECTOMY   Difficult intubation    GERD (gastroesophageal reflux disease)    Headache    MIGRAINES   History of hiatal hernia    Hypertension    PONV (postoperative nausea and vomiting)    Stiff neck    mild limitations of up/down mvmt s/p neck surgery    Past Surgical History:  Procedure Laterality Date   ABDOMINAL HYSTERECTOMY     BALLOON DILATION N/A 04/18/2018   Procedure: BALLOON DILATION;  Surgeon: , MD;  Location: [facility];  Service: Endoscopy;  Laterality: N/A;   ESOPHAGOGASTRODUODENOSCOPY (EGD) WITH PROPOFOL N/A 04/18/2018   Procedure: ESOPHAGOGASTRODUODENOSCOPY (EGD) WITH BIOPSIES;  Surgeon: [name], MD;  Location: [facility];  Service: Endoscopy;  Laterality: N/A;   NECK SURGERY     FUSION C5-C7   OOPHORECTOMY     THYROIDECTOMY     TRACHELECTOMY N/A 10/15/2017   Procedure: TRACHELECTOMY;  Surgeon: Schermerhorn, [name], MD;  Location: [facility];  Service: Gynecology;  Laterality: N/A;   TUBAL  LIGATION      Family History  Problem Relation Age of Onset   Arthritis Mother    Arthritis Father    Stroke Father    Hypertension Father    Heart disease Maternal Uncle    Arthritis Maternal Grandmother    Heart disease Maternal Grandmother    Hypertension Maternal Grandmother    Arthritis Maternal Grandfather    Lung cancer Maternal Grandfather    Heart disease Maternal Grandfather    Arthritis Paternal Grandmother    Heart disease Paternal Grandmother    Hypertension Paternal Grandmother    Arthritis Paternal Grandfather    Heart disease Paternal Grandfather    Stroke Paternal Grandfather     Social History   Socioeconomic History   Marital status: Married    Spouse name: Not on file   Number of children: Not on file   Years of education: Not on file   Highest education level: Not on file  Occupational History   Not on file  Tobacco Use   Smoking status: Former    Types: Cigarettes    Quit date: 09/30/2016    Years since quitting: 4.2   Smokeless tobacco: Never  Vaping Use   Vaping Use: Never used  Substance and Sexual Activity   Alcohol use: Not Currently    Comment: occasional   Drug use: No   Sexual activity: Yes  Other Topics Concern   Not on file  Social History Narrative   Not on file  Social Determinants of Health   Financial Resource Strain: Not on file  Food Insecurity: Not on file  Transportation Needs: Not on file  Physical Activity: Not on file  Stress: Not on file  Social Connections: Not on file  Intimate Partner Violence: Not on file    Outpatient Medications Prior to Visit  Medication Sig Dispense Refill   atorvastatin (LIPITOR) 10 MG tablet TAKE 1 TABLET BY MOUTH  DAILY 90 tablet 2   benazepril-hydrochlorthiazide (LOTENSIN HCT) 10-12.5 MG tablet TAKE 1 TABLET BY MOUTH  DAILY 90 tablet 2   cyclobenzaprine (FLEXERIL) 10 MG tablet TAKE 2 TABLETS BY MOUTH AT  BEDTIME 180 tablet 2   gabapentin (NEURONTIN) 300 MG capsule Take 1  capsule (300 mg total) by mouth 2 (two) times daily. 180 capsule 1   nortriptyline (PAMELOR) 10 MG capsule Take 10 mg by mouth every morning.     nortriptyline (PAMELOR) 50 MG capsule Take 50 mg by mouth at bedtime.     omeprazole (PRILOSEC) 40 MG capsule TAKE 1 CAPSULE BY MOUTH  TWICE DAILY 180 capsule 3   QUEtiapine (SEROQUEL) 25 MG tablet Take 50 mg by mouth at bedtime.     rizatriptan (MAXALT) 10 MG tablet Take by mouth. 1 tablet by mouth as directed     valACYclovir (VALTREX) 500 MG tablet TAKE 1 TABLET BY MOUTH  DAILY 90 tablet 2   venlafaxine (EFFEXOR) 37.5 MG tablet Take 1 tablet by mouth daily. For 1 week and then increase to 2 times daily     gabapentin (NEURONTIN) 300 MG capsule Take 1 capsule by mouth 2 (two) times daily.     albuterol (VENTOLIN HFA) 108 (90 Base) MCG/ACT inhaler INHALE 2 PUFFS INTO THE LUNGS EVERY 6 HOURS AS NEEDED FOR WHEEZING OR SHORTNESS OF BREATH 54 g 1   Aspirin-Acetaminophen-Caffeine (GOODY HEADACHE PO) Take by mouth daily as needed.     nortriptyline (PAMELOR) 10 MG capsule TAKE 3 CAPSULES BY MOUTH AT NIGHT FOR 1 WEEK. INCREASE TO 40 MG AT NIGHT AND. CONTINUE THAT DOSE     predniSONE (STERAPRED UNI-PAK 21 TAB) 10 MG (21) TBPK tablet Take 6 tablets on the first day and decrease by 1 tablet each day until finished. 21 tablet 0   tiZANidine (ZANAFLEX) 4 MG tablet Take 1 tablet (4 mg total) by mouth 3 (three) times daily. 30 tablet 0   No facility-administered medications prior to visit.    Allergies  Allergen Reactions   Adhesive [Tape] Other (See Comments)    Bruises skin   Latex Other (See Comments)    Swelling (vaginal)   Lexapro [Escitalopram Oxalate] Other (See Comments)    Loss taste   Metoprolol Other (See Comments)    Lowers heart rate   Topamax [Topiramate]     Loss taste    ROS Review of Systems  Constitutional:  Negative for chills, fatigue and fever.  Respiratory:  Negative for cough and shortness of breath.   Cardiovascular:  Negative  for chest pain, palpitations and leg swelling.  Gastrointestinal:  Positive for constipation. Negative for blood in stool, diarrhea, nausea and vomiting.  Musculoskeletal:  Positive for back pain and neck stiffness.  Neurological:  Positive for numbness and headaches.  Psychiatric/Behavioral:  Negative for hallucinations and suicidal ideas.        Patient states short term memory issues     Objective:    Physical Exam Vitals and nursing note reviewed.  HENT:     Right Ear: Tympanic membrane, ear  canal and external ear normal. There is no impacted cerumen.     Left Ear: Tympanic membrane, ear canal and external ear normal. There is no impacted cerumen.     Mouth/Throat:     Mouth: Mucous membranes are moist.     Pharynx: Oropharynx is clear.  Eyes:     Extraocular Movements: Extraocular movements intact.     Pupils: Pupils are equal, round, and reactive to light.  Cardiovascular:     Rate and Rhythm: Normal rate and regular rhythm.  Pulmonary:     Effort: Pulmonary effort is normal.     Breath sounds: Normal breath sounds.  Abdominal:     General: Bowel sounds are normal.  Musculoskeletal:     Right lower leg: No edema.     Left lower leg: No edema.  Lymphadenopathy:     Cervical: No cervical adenopathy.  Neurological:     Mental Status: She is alert.     Motor: Weakness present.     Deep Tendon Reflexes: Reflexes abnormal.     Reflex Scores:      Bicep reflexes are 2+ on the right side and 2+ on the left side.      Patellar reflexes are 2+ on the right side and 1+ on the left side.    Comments: 4/5 RLE strength  5/5 LLE and bilateral upper extremities strength   Psychiatric:        Mood and Affect: Mood normal.        Behavior: Behavior normal.        Thought Content: Thought content normal.        Judgment: Judgment normal.    BP 128/76   Pulse 96   Temp 98 F (36.7 C)   Resp 12   Ht 5' 5.75" (1.67 m)   Wt 172 lb 7 oz (78.2 kg)   SpO2 96%   BMI 28.04 kg/m   Wt Readings from Last 3 Encounters:  01/06/21 172 lb 7 oz (78.2 kg)  05/17/20 182 lb (82.6 kg)  05/10/20 182 lb (82.6 kg)     There are no preventive care reminders to display for this patient.  There are no preventive care reminders to display for this patient.  Lab Results  Component Value Date   TSH 2.35 05/17/2020   Lab Results  Component Value Date   WBC 8.4 05/17/2020   HGB 12.8 05/17/2020   HCT 38.8 05/17/2020   MCV 82.7 05/17/2020   PLT 367 05/17/2020   Lab Results  Component Value Date   NA 141 05/17/2020   K 3.8 05/17/2020   CO2 31 05/17/2020   GLUCOSE 96 05/17/2020   BUN 12 05/17/2020   CREATININE 0.93 05/17/2020   BILITOT 0.2 05/17/2020   ALKPHOS 97 10/10/2019   AST 14 05/17/2020   ALT 12 05/17/2020   PROT 6.9 05/17/2020   ALBUMIN 4.6 10/10/2019   CALCIUM 9.5 05/17/2020   ANIONGAP 13 09/30/2017   GFR 73.27 10/10/2019   Lab Results  Component Value Date   CHOL 232 (H) 05/17/2020   Lab Results  Component Value Date   HDL 68 05/17/2020   Lab Results  Component Value Date   LDLCALC 137 (H) 05/17/2020   Lab Results  Component Value Date   TRIG 144 05/17/2020   Lab Results  Component Value Date   CHOLHDL 3.4 05/17/2020   Lab Results  Component Value Date   HGBA1C 6.2 (H) 05/17/2020      Assessment & Plan:  Problem List Items Addressed This Visit       Cardiovascular and Mediastinum   HTN (hypertension) - Primary    Does check her blood pressure at home on occasion.  Currently maintained on benazepril-hydrochlorothiazide daily.  Continue checking blood pressures at home continue taking benazepril-hydrochlorothiazide as prescribed.      Relevant Medications   atorvastatin (LIPITOR) 10 MG tablet   benazepril-hydrochlorthiazide (LOTENSIN HCT) 10-12.5 MG tablet   Other Relevant Orders   CBC   Comprehensive metabolic panel     Digestive   GERD (gastroesophageal reflux disease)    Has been evaluated by GI.  Had an upper endoscopy  03/2018.  Recommended continuing PPI.  Patient on omeprazole 40 mg twice daily.  Continue.      Recurrent cold sores    History of recurrent cold sores.  Patient states that she tried to wean off the valacyclovir in the past and started to have recurrent sores.  We will continue medication for time being.  Reevaluate in approximately 6 months when she comes in for her physical      Relevant Medications   valACYclovir (VALTREX) 500 MG tablet     Nervous and Auditory   Lumbar radiculopathy    Patient currently maintained on gabapentin 300 mg twice daily.  Stable functional dose.  Continue medication.  Also takes Flexeril 20 mg nightly and continue      Relevant Medications   venlafaxine (EFFEXOR) 37.5 MG tablet   nortriptyline (PAMELOR) 10 MG capsule   nortriptyline (PAMELOR) 50 MG capsule   cyclobenzaprine (FLEXERIL) 10 MG tablet   gabapentin (NEURONTIN) 300 MG capsule     Other   Chronic neck pain    Patient with history of cervical spinal fusion.  Does take gabapentin and Flexeril as needed.  Continue above medications.      Relevant Medications   venlafaxine (EFFEXOR) 37.5 MG tablet   nortriptyline (PAMELOR) 10 MG capsule   nortriptyline (PAMELOR) 50 MG capsule   cyclobenzaprine (FLEXERIL) 10 MG tablet   gabapentin (NEURONTIN) 300 MG capsule   Mixed hyperlipidemia    Currently on atorvastatin 10 mg daily.  We will check lipids in office today.  Continue medication currently.      Relevant Medications   atorvastatin (LIPITOR) 10 MG tablet   benazepril-hydrochlorthiazide (LOTENSIN HCT) 10-12.5 MG tablet   Frequent headaches    Patient is followed by neurology.  She is on venlafaxine, Seroquel, nortriptyline, Maxalt.  Given on several medications that can cause metabolic side effects and previous hemoglobin A1c of 6.2 we will recheck in office today.  Continue medication as prescribed from neurology.  Follow-up with clinic as recommended.      Relevant Medications    rizatriptan (MAXALT) 10 MG tablet   venlafaxine (EFFEXOR) 37.5 MG tablet   nortriptyline (PAMELOR) 10 MG capsule   nortriptyline (PAMELOR) 50 MG capsule   cyclobenzaprine (FLEXERIL) 10 MG tablet   gabapentin (NEURONTIN) 300 MG capsule   Pre-diabetes    A1c was 6.2.  Given she is on medications that can cause metabolic side effects we will recheck in office today.  Pending lab results      Relevant Orders   Hemoglobin A1c    No orders of the defined types were placed in this encounter.   Follow-up: Return in about 6 months (around 07/06/2021) for CPE and Labs.   This visit occurred during the SARS-CoV-2 public health emergency.  Safety protocols were in place, including screening questions prior to the visit, additional  usage of staff PPE, and extensive cleaning of exam room while observing appropriate contact time as indicated for disinfecting solutions.   Audria Nine, NP

## 2021-01-06 NOTE — Assessment & Plan Note (Signed)
Does check her blood pressure at home on occasion.  Currently maintained on benazepril-hydrochlorothiazide daily.  Continue checking blood pressures at home continue taking benazepril-hydrochlorothiazide as prescribed.

## 2021-01-06 NOTE — Assessment & Plan Note (Signed)
Patient currently maintained on gabapentin 300 mg twice daily.  Stable functional dose.  Continue medication.  Also takes Flexeril 20 mg nightly and continue

## 2021-01-06 NOTE — Assessment & Plan Note (Signed)
History of recurrent cold sores.  Patient states that she tried to wean off the valacyclovir in the past and started to have recurrent sores.  We will continue medication for time being.  Reevaluate in approximately 6 months when she comes in for her physical

## 2021-01-06 NOTE — Assessment & Plan Note (Signed)
Currently on atorvastatin 10 mg daily.  We will check lipids in office today.  Continue medication currently.

## 2021-01-06 NOTE — Patient Instructions (Signed)
Nice to see you today Will be in touch with lab results Refills sent to pharmacy on file

## 2021-01-06 NOTE — Assessment & Plan Note (Signed)
Patient with history of cervical spinal fusion.  Does take gabapentin and Flexeril as needed.  Continue above medications.

## 2021-01-06 NOTE — Assessment & Plan Note (Signed)
A1c was 6.2.  Given she is on medications that can cause metabolic side effects we will recheck in office today.  Pending lab results

## 2021-01-06 NOTE — Assessment & Plan Note (Signed)
Patient is followed by neurology.  She is on venlafaxine, Seroquel, nortriptyline, Maxalt.  Given on several medications that can cause metabolic side effects and previous hemoglobin A1c of 6.2 we will recheck in office today.  Continue medication as prescribed from neurology.  Follow-up with clinic as recommended.

## 2021-01-07 LAB — COMPREHENSIVE METABOLIC PANEL
ALT: 13 U/L (ref 0–35)
AST: 16 U/L (ref 0–37)
Albumin: 4.3 g/dL (ref 3.5–5.2)
Alkaline Phosphatase: 91 U/L (ref 39–117)
BUN: 10 mg/dL (ref 6–23)
CO2: 32 mEq/L (ref 19–32)
Calcium: 9.3 mg/dL (ref 8.4–10.5)
Chloride: 102 mEq/L (ref 96–112)
Creatinine, Ser: 0.94 mg/dL (ref 0.40–1.20)
GFR: 74.35 mL/min (ref >60.00)
Glucose, Bld: 88 mg/dL (ref 70–99)
Potassium: 3.8 mEq/L (ref 3.5–5.1)
Sodium: 142 mEq/L (ref 135–145)
Total Bilirubin: 0.3 mg/dL (ref 0.2–1.2)
Total Protein: 7 g/dL (ref 6.0–8.3)

## 2021-01-07 LAB — CBC
HCT: 35.9 % — ABNORMAL LOW (ref 36.0–46.0)
Hemoglobin: 11.8 g/dL — ABNORMAL LOW (ref 12.0–15.0)
MCHC: 32.9 g/dL (ref 30.0–36.0)
MCV: 80.9 fl (ref 78.0–100.0)
Platelets: 275 10*3/uL (ref 150.0–400.0)
RBC: 4.44 Mil/uL (ref 3.87–5.11)
RDW: 16.1 % — ABNORMAL HIGH (ref 11.5–15.5)
WBC: 8.4 10*3/uL (ref 4.0–10.5)

## 2021-01-07 LAB — HEMOGLOBIN A1C: Hgb A1c MFr Bld: 6.4 % (ref 4.6–6.5)

## 2021-01-08 ENCOUNTER — Encounter: Payer: Self-pay | Admitting: Nurse Practitioner

## 2021-01-13 DIAGNOSIS — Z1231 Encounter for screening mammogram for malignant neoplasm of breast: Secondary | ICD-10-CM | POA: Diagnosis not present

## 2021-01-13 DIAGNOSIS — Z01419 Encounter for gynecological examination (general) (routine) without abnormal findings: Secondary | ICD-10-CM | POA: Diagnosis not present

## 2021-01-21 DIAGNOSIS — G479 Sleep disorder, unspecified: Secondary | ICD-10-CM | POA: Diagnosis not present

## 2021-01-21 DIAGNOSIS — R413 Other amnesia: Secondary | ICD-10-CM | POA: Diagnosis not present

## 2021-01-21 DIAGNOSIS — R519 Headache, unspecified: Secondary | ICD-10-CM | POA: Diagnosis not present

## 2021-01-21 DIAGNOSIS — M542 Cervicalgia: Secondary | ICD-10-CM | POA: Diagnosis not present

## 2021-02-05 ENCOUNTER — Other Ambulatory Visit: Payer: Self-pay | Admitting: Neurology

## 2021-02-05 ENCOUNTER — Other Ambulatory Visit (HOSPITAL_COMMUNITY): Payer: Self-pay | Admitting: Neurology

## 2021-02-05 DIAGNOSIS — R519 Headache, unspecified: Secondary | ICD-10-CM

## 2021-02-13 ENCOUNTER — Ambulatory Visit
Admission: RE | Admit: 2021-02-13 | Discharge: 2021-02-13 | Disposition: A | Discharge status: Home / Self Care | Payer: BC Managed Care – PPO | Source: Ambulatory Visit | Attending: Neurology | Admitting: Neurology

## 2021-02-13 ENCOUNTER — Other Ambulatory Visit: Payer: Self-pay

## 2021-02-13 DIAGNOSIS — R519 Headache, unspecified: Secondary | ICD-10-CM | POA: Insufficient documentation

## 2021-02-19 DIAGNOSIS — R413 Other amnesia: Secondary | ICD-10-CM | POA: Diagnosis not present

## 2021-02-19 DIAGNOSIS — G479 Sleep disorder, unspecified: Secondary | ICD-10-CM | POA: Diagnosis not present

## 2021-02-19 DIAGNOSIS — R519 Headache, unspecified: Secondary | ICD-10-CM | POA: Diagnosis not present

## 2021-02-19 DIAGNOSIS — M542 Cervicalgia: Secondary | ICD-10-CM | POA: Diagnosis not present

## 2021-03-19 ENCOUNTER — Other Ambulatory Visit: Payer: Self-pay

## 2021-03-19 ENCOUNTER — Ambulatory Visit (INDEPENDENT_AMBULATORY_CARE_PROVIDER_SITE_OTHER): Payer: BC Managed Care – PPO | Admitting: Nurse Practitioner

## 2021-03-19 ENCOUNTER — Ambulatory Visit (INDEPENDENT_AMBULATORY_CARE_PROVIDER_SITE_OTHER)
Admission: RE | Admit: 2021-03-19 | Discharge: 2021-03-19 | Disposition: A | Discharge status: Home / Self Care | Payer: BC Managed Care – PPO | Source: Ambulatory Visit | Attending: Nurse Practitioner | Admitting: Nurse Practitioner

## 2021-03-19 ENCOUNTER — Encounter: Payer: Self-pay | Admitting: Nurse Practitioner

## 2021-03-19 VITALS — BP 112/76 | HR 100 | Temp 97.9°F | Resp 10 | Ht 65.75 in | Wt 171.0 lb

## 2021-03-19 DIAGNOSIS — K5900 Constipation, unspecified: Secondary | ICD-10-CM | POA: Diagnosis not present

## 2021-03-19 DIAGNOSIS — K2900 Acute gastritis without bleeding: Secondary | ICD-10-CM

## 2021-03-19 DIAGNOSIS — K2970 Gastritis, unspecified, without bleeding: Secondary | ICD-10-CM | POA: Insufficient documentation

## 2021-03-19 MED ORDER — SUCRALFATE 1 G PO TABS: 1.0000 g | ORAL_TABLET | Freq: Three times a day (TID) | ORAL | 0 refills | Status: DC

## 2021-03-19 NOTE — Assessment & Plan Note (Signed)
Patient is avoiding NSAIDs, and alcohol. Is followed by GI and currently on omeprazole 40mg  BID. Will treat for gastritis with Carafate. Discussed if symptoms do not improve will try and get her in sooner with her GI provider.  No red alarm signs. Weight stable since last office visit No history of gastric cancers in the family that she is aware of or with her personally

## 2021-03-19 NOTE — Assessment & Plan Note (Signed)
C/o vomiting undigested food. Has not been having cathartic BMs. Will obtain a abdomen 2 view. Pending xray results

## 2021-03-19 NOTE — Patient Instructions (Addendum)
Nice to see you Will be in touch in regards to labs and xray Sent in the Carafate pill. Follow up if symptoms dont improve or get worse

## 2021-03-19 NOTE — Progress Notes (Signed)
Acute Office Visit  Subjective:    Patient ID: Morgan [name], female    DOB: Jun 27, 1975, 45 y.o.   MRN: [id number]  Chief Complaint  Patient presents with   Discuss issue with burping up sulfur taste food trying to c    Started back about 2 weeks ago, had this issue a year ago and it got better. She does have hiatal hernia. Abdominal cramping, nausea. Not able to eat well. Concerned for H Pylori.   Nasal Congestion    And scratchy throat started on 03/18/21, post nasal drip. No fever.    HPI Patient is in today for Stomach issues.  Had the same thing that happened last year and cut out ginger ale and got better  Two weeks ago it flared up. Decreased appetite. Awful smelling and tasting burps. Can cause vomiting and when she vomits she has undigested food.  Last episode of vomited a week ago Already on omeprazole 40mg  BID Has been using alka seltzer for another reason and has not helped with stomach   Is established with with GI Dr. [name]. Has appt in January. Did have an endoscopy in 2019. Which I reviewed the report  Past Medical History:  Diagnosis Date   Complication of anesthesia    STATES TOLD REINTUBATION WITH HYSTERECTOMY   Difficult intubation    GERD (gastroesophageal reflux disease)    Headache    MIGRAINES   History of hiatal hernia    Hypertension    PONV (postoperative nausea and vomiting)    Stiff neck    mild limitations of up/down mvmt s/p neck surgery    Past Surgical History:  Procedure Laterality Date   ABDOMINAL HYSTERECTOMY     BALLOON DILATION N/A 04/18/2018   Procedure: BALLOON DILATION;  Surgeon: [name], MD;  Location: [facility];  Service: Endoscopy;  Laterality: N/A;   ESOPHAGOGASTRODUODENOSCOPY (EGD) WITH PROPOFOL N/A 04/18/2018   Procedure: ESOPHAGOGASTRODUODENOSCOPY (EGD) WITH BIOPSIES;  Surgeon: [name], MD;  Location: [facility];  Service: Endoscopy;  Laterality: N/A;   NECK SURGERY     FUSION C5-C7    OOPHORECTOMY     THYROIDECTOMY     TRACHELECTOMY N/A 10/15/2017   Procedure: TRACHELECTOMY;  Surgeon: Schermerhorn, Gwen Her, MD;  Location: [facility];  Service: Gynecology;  Laterality: N/A;   TUBAL LIGATION      Family History  Problem Relation Age of Onset   Hypertension Mother    Arthritis Mother    Arthritis Father    Stroke Father    Hypertension Father    Heart disease Maternal Uncle    Arthritis Maternal Grandmother    Heart disease Maternal Grandmother    Hypertension Maternal Grandmother    Arthritis Maternal Grandfather    Lung cancer Maternal Grandfather    Heart disease Maternal Grandfather    Arthritis Paternal Grandmother    Heart disease Paternal Grandmother    Hypertension Paternal Grandmother    Arthritis Paternal Grandfather    Heart disease Paternal Grandfather    Stroke Paternal Grandfather     Social History   Socioeconomic History   Marital status: Married    Spouse name: Not on file   Number of children: Not on file   Years of education: Not on file   Highest education level: Not on file  Occupational History   Not on file  Tobacco Use   Smoking status: Former    Types: Cigarettes    Quit date: 09/30/2016    Years  since quitting: 4.4   Smokeless tobacco: Never  Vaping Use   Vaping Use: Never used  Substance and Sexual Activity   Alcohol use: Not Currently    Comment: occasional   Drug use: No   Sexual activity: Yes  Other Topics Concern   Not on file  Social History Narrative   Not on file   Social Determinants of Health   Financial Resource Strain: Not on file  Food Insecurity: Not on file  Transportation Needs: Not on file  Physical Activity: Not on file  Stress: Not on file  Social Connections: Not on file  Intimate Partner Violence: Not on file    Outpatient Medications Prior to Visit  Medication Sig Dispense Refill   AJOVY 225 MG/1.5ML SOAJ Inject into the skin.     atorvastatin (LIPITOR) 10 MG tablet Take 1 tablet (10  mg total) by mouth daily. 90 tablet 3   benazepril-hydrochlorthiazide (LOTENSIN HCT) 10-12.5 MG tablet Take 1 tablet by mouth daily. 90 tablet 3   cyclobenzaprine (FLEXERIL) 10 MG tablet Take 2 tablets (20 mg total) by mouth at bedtime. 180 tablet 1   gabapentin (NEURONTIN) 300 MG capsule Take 1 capsule (300 mg total) by mouth 2 (two) times daily. 180 capsule 1   nortriptyline (PAMELOR) 10 MG capsule Take 10 mg by mouth every morning.     nortriptyline (PAMELOR) 50 MG capsule Take 50 mg by mouth at bedtime.     omeprazole (PRILOSEC) 40 MG capsule TAKE 1 CAPSULE BY MOUTH  TWICE DAILY 180 capsule 3   QUEtiapine (SEROQUEL) 25 MG tablet Take 50 mg by mouth at bedtime.     rizatriptan (MAXALT) 10 MG tablet Take by mouth. 1 tablet by mouth as directed     valACYclovir (VALTREX) 500 MG tablet Take 1 tablet (500 mg total) by mouth daily. 90 tablet 3   venlafaxine (EFFEXOR) 37.5 MG tablet Take 1 tablet by mouth daily. For 1 week and then increase to 2 times daily     No facility-administered medications prior to visit.    Allergies  Allergen Reactions   Adhesive [Tape] Other (See Comments)    Bruises skin   Latex Other (See Comments)    Swelling (vaginal)   Lexapro [Escitalopram Oxalate] Other (See Comments)    Loss taste   Metoprolol Other (See Comments)    Lowers heart rate   Topamax [Topiramate]     Loss taste    Review of Systems  Constitutional:  Positive for fatigue (baseline). Negative for chills and fever.  HENT:  Positive for congestion, postnasal drip and sore throat (scratchy).   Respiratory:  Negative for cough and shortness of breath.   Cardiovascular:  Negative for chest pain.  Gastrointestinal:  Positive for abdominal pain (cramping), constipation (small BM this AM), nausea and vomiting. Negative for diarrhea.      Objective:    Physical Exam Vitals and nursing note reviewed.  Constitutional:      Appearance: Normal appearance.  HENT:     Right Ear: Tympanic  membrane, ear canal and external ear normal. There is no impacted cerumen.     Left Ear: Tympanic membrane, ear canal and external ear normal. There is no impacted cerumen.     Nose:     Right Sinus: No maxillary sinus tenderness or frontal sinus tenderness.     Left Sinus: No maxillary sinus tenderness or frontal sinus tenderness.     Mouth/Throat:     Mouth: Mucous membranes are moist.  Pharynx: No posterior oropharyngeal erythema.  Cardiovascular:     Rate and Rhythm: Normal rate and regular rhythm.  Pulmonary:     Effort: Pulmonary effort is normal.     Breath sounds: Normal breath sounds.  Abdominal:     General: Bowel sounds are normal. There is no distension.     Palpations: There is no mass.     Tenderness: There is abdominal tenderness in the epigastric area. There is no guarding or rebound.    Lymphadenopathy:     Cervical: No cervical adenopathy.  Skin:    General: Skin is warm.  Neurological:     Mental Status: She is alert.  Psychiatric:        Mood and Affect: Mood normal.        Behavior: Behavior normal.        Thought Content: Thought content normal.        Judgment: Judgment normal.    BP 112/76   Pulse 100   Temp 97.9 F (36.6 C)   Resp 10   Ht 5' 5.75" (1.67 m)   Wt 171 lb (77.6 kg)   SpO2 98%   BMI 27.81 kg/m  Wt Readings from Last 3 Encounters:  03/19/21 171 lb (77.6 kg)  01/06/21 172 lb 7 oz (78.2 kg)  05/17/20 182 lb (82.6 kg)    Health Maintenance Due  Topic Date Due   COVID-19 Vaccine (4 - Booster for Pfizer series) 07/29/2020    There are no preventive care reminders to display for this patient.   Lab Results  Component Value Date   TSH 2.35 05/17/2020   Lab Results  Component Value Date   WBC 8.4 01/06/2021   HGB 11.8 (L) 01/06/2021   HCT 35.9 (L) 01/06/2021   MCV 80.9 01/06/2021   PLT 275.0 01/06/2021   Lab Results  Component Value Date   NA 142 01/06/2021   K 3.8 01/06/2021   CO2 32 01/06/2021   GLUCOSE 88  01/06/2021   BUN 10 01/06/2021   CREATININE 0.94 01/06/2021   BILITOT 0.3 01/06/2021   ALKPHOS 91 01/06/2021   AST 16 01/06/2021   ALT 13 01/06/2021   PROT 7.0 01/06/2021   ALBUMIN 4.3 01/06/2021   CALCIUM 9.3 01/06/2021   ANIONGAP 13 09/30/2017   GFR 74.35 01/06/2021   Lab Results  Component Value Date   CHOL 232 (H) 05/17/2020   Lab Results  Component Value Date   HDL 68 05/17/2020   Lab Results  Component Value Date   LDLCALC 137 (H) 05/17/2020   Lab Results  Component Value Date   TRIG 144 05/17/2020   Lab Results  Component Value Date   CHOLHDL 3.4 05/17/2020   Lab Results  Component Value Date   HGBA1C 6.4 01/06/2021       Assessment & Plan:   Problem List Items Addressed This Visit       Digestive   Gastritis - Primary    Patient is avoiding NSAIDs, and alcohol. Is followed by GI and currently on omeprazole 40mg  BID. Will treat for gastritis with Carafate. Discussed if symptoms do not improve will try and get her in sooner with her GI provider.  No red alarm signs. Weight stable since last office visit No history of gastric cancers in the family that she is aware of or with her personally      Relevant Medications   sucralfate (CARAFATE) 1 g tablet   Other Relevant Orders   CBC  Comprehensive metabolic panel   Lipase     Other   Constipation    C/o vomiting undigested food. Has not been having cathartic BMs. Will obtain a abdomen 2 view. Pending xray results      Relevant Orders   DG Abd 2 Views     No orders of the defined types were placed in this encounter.  This visit occurred during the SARS-CoV-2 public health emergency.  Safety protocols were in place, including screening questions prior to the visit, additional usage of staff PPE, and extensive cleaning of exam room while observing appropriate contact time as indicated for disinfecting solutions.   Romilda Garret, NP

## 2021-03-20 LAB — CBC
HCT: 36.4 % (ref 36.0–46.0)
Hemoglobin: 11.9 g/dL — ABNORMAL LOW (ref 12.0–15.0)
MCHC: 32.8 g/dL (ref 30.0–36.0)
MCV: 81.8 fl (ref 78.0–100.0)
Platelets: 314 10*3/uL (ref 150.0–400.0)
RBC: 4.45 Mil/uL (ref 3.87–5.11)
RDW: 15.8 % — ABNORMAL HIGH (ref 11.5–15.5)
WBC: 6.5 10*3/uL (ref 4.0–10.5)

## 2021-03-20 LAB — COMPREHENSIVE METABOLIC PANEL
ALT: 18 U/L (ref 0–35)
AST: 24 U/L (ref 0–37)
Albumin: 4.4 g/dL (ref 3.5–5.2)
Alkaline Phosphatase: 89 U/L (ref 39–117)
BUN: 13 mg/dL (ref 6–23)
CO2: 33 mEq/L — ABNORMAL HIGH (ref 19–32)
Calcium: 9.5 mg/dL (ref 8.4–10.5)
Chloride: 100 mEq/L (ref 96–112)
Creatinine, Ser: 0.94 mg/dL (ref 0.40–1.20)
GFR: 74.24 mL/min (ref >60.00)
Glucose, Bld: 90 mg/dL (ref 70–99)
Potassium: 3.5 mEq/L (ref 3.5–5.1)
Sodium: 141 mEq/L (ref 135–145)
Total Bilirubin: 0.3 mg/dL (ref 0.2–1.2)
Total Protein: 7.5 g/dL (ref 6.0–8.3)

## 2021-03-20 LAB — LIPASE: Lipase: 18 U/L (ref 11.0–59.0)

## 2021-03-24 ENCOUNTER — Encounter: Payer: Self-pay | Admitting: Nurse Practitioner

## 2021-03-31 ENCOUNTER — Other Ambulatory Visit: Payer: Self-pay

## 2021-03-31 ENCOUNTER — Emergency Department
Admission: EM | Admit: 2021-03-31 | Discharge: 2021-03-31 | Disposition: A | Discharge status: Home / Self Care | Payer: BC Managed Care – PPO | Attending: Emergency Medicine | Admitting: Emergency Medicine

## 2021-03-31 ENCOUNTER — Encounter: Payer: Self-pay | Admitting: Nurse Practitioner

## 2021-03-31 ENCOUNTER — Encounter: Payer: Self-pay | Admitting: Emergency Medicine

## 2021-03-31 ENCOUNTER — Emergency Department: Payer: BC Managed Care – PPO

## 2021-03-31 DIAGNOSIS — R1031 Right lower quadrant pain: Secondary | ICD-10-CM | POA: Diagnosis not present

## 2021-03-31 DIAGNOSIS — Z87891 Personal history of nicotine dependence: Secondary | ICD-10-CM | POA: Insufficient documentation

## 2021-03-31 DIAGNOSIS — K5000 Crohn's disease of small intestine without complications: Secondary | ICD-10-CM | POA: Insufficient documentation

## 2021-03-31 DIAGNOSIS — I10 Essential (primary) hypertension: Secondary | ICD-10-CM | POA: Insufficient documentation

## 2021-03-31 DIAGNOSIS — K2900 Acute gastritis without bleeding: Secondary | ICD-10-CM

## 2021-03-31 DIAGNOSIS — Z79899 Other long term (current) drug therapy: Secondary | ICD-10-CM | POA: Insufficient documentation

## 2021-03-31 DIAGNOSIS — K219 Gastro-esophageal reflux disease without esophagitis: Secondary | ICD-10-CM | POA: Diagnosis not present

## 2021-03-31 DIAGNOSIS — Z91040 Latex allergy status: Secondary | ICD-10-CM | POA: Insufficient documentation

## 2021-03-31 DIAGNOSIS — R109 Unspecified abdominal pain: Secondary | ICD-10-CM | POA: Diagnosis not present

## 2021-03-31 LAB — CBC WITH DIFFERENTIAL/PLATELET
Abs Immature Granulocytes: 0.01 10*3/uL (ref 0.00–0.07)
Basophils Absolute: 0.1 10*3/uL (ref 0.0–0.1)
Basophils Relative: 1 %
Eosinophils Absolute: 0.3 10*3/uL (ref 0.0–0.5)
Eosinophils Relative: 4 %
HCT: 39.6 % (ref 36.0–46.0)
Hemoglobin: 12.3 g/dL (ref 12.0–15.0)
Immature Granulocytes: 0 %
Lymphocytes Relative: 29 %
Lymphs Abs: 1.9 10*3/uL (ref 0.7–4.0)
MCH: 26.2 pg (ref 26.0–34.0)
MCHC: 31.1 g/dL (ref 30.0–36.0)
MCV: 84.3 fL (ref 80.0–100.0)
Monocytes Absolute: 0.4 10*3/uL (ref 0.1–1.0)
Monocytes Relative: 7 %
Neutro Abs: 4 10*3/uL (ref 1.7–7.7)
Neutrophils Relative %: 59 %
Platelets: 326 10*3/uL (ref 150–400)
RBC: 4.7 MIL/uL (ref 3.87–5.11)
RDW: 15.1 % (ref 11.5–15.5)
WBC: 6.7 10*3/uL (ref 4.0–10.5)
nRBC: 0 % (ref 0.0–0.2)

## 2021-03-31 LAB — COMPREHENSIVE METABOLIC PANEL
ALT: 18 U/L (ref 0–44)
AST: 21 U/L (ref 15–41)
Albumin: 4.4 g/dL (ref 3.5–5.0)
Alkaline Phosphatase: 99 U/L (ref 38–126)
Anion gap: 7 (ref 5–15)
BUN: 12 mg/dL (ref 6–20)
CO2: 29 mmol/L (ref 22–32)
Calcium: 9.2 mg/dL (ref 8.9–10.3)
Chloride: 103 mmol/L (ref 98–111)
Creatinine, Ser: 0.77 mg/dL (ref 0.44–1.00)
GFR, Estimated: >60 mL/min (ref >60)
Glucose, Bld: 111 mg/dL — ABNORMAL HIGH (ref 70–99)
Potassium: 3.8 mmol/L (ref 3.5–5.1)
Sodium: 139 mmol/L (ref 135–145)
Total Bilirubin: 0.5 mg/dL (ref 0.3–1.2)
Total Protein: 7.9 g/dL (ref 6.5–8.1)

## 2021-03-31 LAB — URINALYSIS, ROUTINE W REFLEX MICROSCOPIC
Bilirubin Urine: NEGATIVE
Glucose, UA: NEGATIVE mg/dL
Hgb urine dipstick: NEGATIVE
Ketones, ur: NEGATIVE mg/dL
Leukocytes,Ua: NEGATIVE
Nitrite: NEGATIVE
Protein, ur: NEGATIVE mg/dL
Specific Gravity, Urine: 1.004 — ABNORMAL LOW (ref 1.005–1.030)
pH: 7 (ref 5.0–8.0)

## 2021-03-31 LAB — PREGNANCY, URINE: Preg Test, Ur: NEGATIVE

## 2021-03-31 LAB — LACTIC ACID, PLASMA: Lactic Acid, Venous: 0.6 mmol/L (ref 0.5–1.9)

## 2021-03-31 MED ORDER — SODIUM CHLORIDE 0.9 % IV BOLUS
1000.0000 mL | Freq: Once | INTRAVENOUS | Status: AC
  Administered 2021-03-31: 1000 mL via INTRAVENOUS

## 2021-03-31 MED ORDER — LIDOCAINE VISCOUS HCL 2 % MT SOLN
15.0000 mL | Freq: Once | OROMUCOSAL | Status: AC
  Administered 2021-03-31: 15 mL via ORAL
  Filled 2021-03-31: qty 15

## 2021-03-31 MED ORDER — IOHEXOL 300 MG/ML  SOLN
100.0000 mL | Freq: Once | INTRAMUSCULAR | Status: AC | PRN
  Administered 2021-03-31: 100 mL via INTRAVENOUS

## 2021-03-31 MED ORDER — HYDROCODONE-ACETAMINOPHEN 5-325 MG PO TABS
1.0000 | ORAL_TABLET | Freq: Once | ORAL | Status: AC
  Administered 2021-03-31: 1 via ORAL
  Filled 2021-03-31: qty 1

## 2021-03-31 MED ORDER — MORPHINE SULFATE (PF) 4 MG/ML IV SOLN
6.0000 mg | Freq: Once | INTRAVENOUS | Status: AC
  Administered 2021-03-31: 6 mg via INTRAVENOUS
  Filled 2021-03-31: qty 2

## 2021-03-31 MED ORDER — ALUM & MAG HYDROXIDE-SIMETH 200-200-20 MG/5ML PO SUSP
30.0000 mL | Freq: Once | ORAL | Status: AC
  Administered 2021-03-31: 30 mL via ORAL
  Filled 2021-03-31: qty 30

## 2021-03-31 MED ORDER — ONDANSETRON HCL 4 MG/2ML IJ SOLN
4.0000 mg | Freq: Once | INTRAMUSCULAR | Status: AC
  Administered 2021-03-31: 4 mg via INTRAVENOUS
  Filled 2021-03-31: qty 2

## 2021-03-31 MED ORDER — DICYCLOMINE HCL 10 MG PO CAPS
10.0000 mg | ORAL_CAPSULE | Freq: Once | ORAL | Status: AC
  Administered 2021-03-31: 10 mg via ORAL
  Filled 2021-03-31: qty 1

## 2021-03-31 NOTE — ED Provider Notes (Signed)
[facility] Emergency Department Provider Note  ____________________________________________   Event Date/Time   First MD Initiated Contact with Patient 03/31/21 1127     (approximate)  I have reviewed the triage vital signs and the nursing notes.   HISTORY  Chief Complaint Flank Pain    HPI Morgan [name] is a 45 y.o. female  here with R flank pain. Pt reports that today, she experienced initially mild onset of R sided abd pain, which has worsened and is now severe. Describes it as an aching, cramping sensation in RLQ/R periumbilical area. Has had some associated nausea, vomiting. No diarrhea, constipation. Sx are worse w/ certain movements. No specific worsening factors. No change with eating. No known recent sick contacts. No recent travel. No suspicious food intakes.        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   OAB (overactive bladder) 01/17/2018   Chronic neck pain 12/13/2017   HTN (hypertension) 10/29/2016   GERD (gastroesophageal reflux disease) 10/29/2016    Past Surgical History:  Procedure Laterality Date   ABDOMINAL HYSTERECTOMY     BALLOON DILATION N/A 04/18/2018   Procedure: BALLOON DILATION;  Surgeon: [name], MD;  Location: [facility];  Service: Endoscopy;  Laterality: N/A;   ESOPHAGOGASTRODUODENOSCOPY (EGD) WITH PROPOFOL N/A  04/18/2018   Procedure: ESOPHAGOGASTRODUODENOSCOPY (EGD) WITH BIOPSIES;  Surgeon: [name], MD;  Location: [facility];  Service: Endoscopy;  Laterality: N/A;   NECK SURGERY     FUSION C5-C7   OOPHORECTOMY     THYROIDECTOMY     TRACHELECTOMY N/A 10/15/2017   Procedure: TRACHELECTOMY;  Surgeon: Schermerhorn, Ihor Austin, MD;  Location: [facility];  Service: Gynecology;  Laterality: N/A;   TUBAL LIGATION      Prior to Admission medications   Medication Sig Start Date End Date Taking? Authorizing Provider  AJOVY 225 MG/1.5ML SOAJ Inject into the skin. 03/10/21   [provider]  atorvastatin (LIPITOR) 10 MG tablet Take 1 tablet (10 mg total) by mouth daily. 01/06/21   Eden Emms, NP  benazepril-hydrochlorthiazide (LOTENSIN HCT) 10-12.5 MG tablet Take 1 tablet by mouth daily. 01/06/21   Eden Emms, NP  cyclobenzaprine (FLEXERIL) 10 MG tablet Take 2 tablets (20 mg total) by mouth at bedtime. 01/06/21   Eden Emms, NP  gabapentin (NEURONTIN) 300 MG capsule Take 1 capsule (300 mg total) by mouth 2 (two) times daily. 01/06/21   Eden Emms, NP  nortriptyline (PAMELOR) 10 MG capsule Take 10 mg by mouth every morning.    [provider]  nortriptyline (PAMELOR) 50 MG capsule Take 50 mg by mouth at bedtime.    [provider]  omeprazole (PRILOSEC) 40 MG capsule TAKE 1 CAPSULE BY MOUTH  TWICE DAILY 06/05/20   Lorre Munroe, NP  QUEtiapine (SEROQUEL) 25 MG tablet Take 50 mg by  mouth at bedtime. 04/16/20   [provider]  rizatriptan (MAXALT) 10 MG tablet Take by mouth. 1 tablet by mouth as directed 01/02/21 01/02/22  [provider]  sucralfate (CARAFATE) 1 g tablet Take 1 tablet (1 g total) by mouth 4 (four) times daily -  with meals and at bedtime for 7 days. 03/19/21 03/26/21  Michela Pitcher, NP  valACYclovir (VALTREX) 500 MG tablet Take 1 tablet (500 mg total) by mouth daily. 01/06/21   Michela Pitcher, NP  venlafaxine (EFFEXOR) 37.5 MG  tablet Take 1 tablet by mouth daily. For 1 week and then increase to 2 times daily 12/24/20   [provider]    Allergies Adhesive [tape], Latex, Lexapro [escitalopram oxalate], Metoprolol, and Topamax [topiramate]  Family History  Problem Relation Age of Onset   Hypertension Mother    Arthritis Mother    Arthritis Father    Stroke Father    Hypertension Father    Heart disease Maternal Uncle    Arthritis Maternal Grandmother    Heart disease Maternal Grandmother    Hypertension Maternal Grandmother    Arthritis Maternal Grandfather    Lung cancer Maternal Grandfather    Heart disease Maternal Grandfather    Arthritis Paternal Grandmother    Heart disease Paternal Grandmother    Hypertension Paternal Grandmother    Arthritis Paternal Grandfather    Heart disease Paternal Grandfather    Stroke Paternal Grandfather     Social History Social History   Tobacco Use   Smoking status: Former    Types: Cigarettes    Quit date: 09/30/2016    Years since quitting: 4.5   Smokeless tobacco: Never  Vaping Use   Vaping Use: Never used  Substance Use Topics   Alcohol use: Not Currently    Comment: occasional   Drug use: No    Review of Systems  Review of Systems  Constitutional:  Negative for chills and fever.  HENT:  Negative for sore throat.   Respiratory:  Negative for shortness of breath.   Cardiovascular:  Negative for chest pain.  Gastrointestinal:  Positive for abdominal pain and nausea.  Genitourinary:  Positive for flank pain.  Musculoskeletal:  Negative for neck pain.  Skin:  Negative for rash and wound.  Allergic/Immunologic: Negative for immunocompromised state.  Neurological:  Negative for weakness and numbness.  Hematological:  Does not bruise/bleed easily.  All other systems reviewed and are negative.   ____________________________________________  PHYSICAL EXAM:      VITAL SIGNS: ED Triage Vitals  Enc Vitals Group     BP 03/31/21 1042 129/81      Pulse Rate 03/31/21 1042 (!) 109     Resp 03/31/21 1042 16     Temp 03/31/21 1042 98.6 F (37 C)     Temp Source 03/31/21 1042 Oral     SpO2 --      Weight 03/31/21 1043 170 lb 13.7 oz (77.5 kg)     Height 03/31/21 1043 5' 5.75" (1.67 m)     Head Circumference --      Peak Flow --      Pain Score 03/31/21 1043 7     Pain Loc --      Pain Edu? --      Excl. in [place]? --      Physical Exam Vitals and nursing note reviewed.  Constitutional:      General: She is not in acute distress.    Appearance: She is well-developed.  HENT:  Head: Normocephalic and atraumatic.  Eyes:     Conjunctiva/sclera: Conjunctivae normal.  Cardiovascular:     Rate and Rhythm: Normal rate and regular rhythm.     Heart sounds: Normal heart sounds. No murmur heard.   No friction rub.  Pulmonary:     Effort: Pulmonary effort is normal. No respiratory distress.     Breath sounds: Normal breath sounds. No wheezing or rales.  Abdominal:     General: There is no distension.     Palpations: Abdomen is soft.     Tenderness: There is abdominal tenderness in the right lower quadrant. There is no guarding or rebound.  Musculoskeletal:     Cervical back: Neck supple.  Skin:    General: Skin is warm.     Capillary Refill: Capillary refill takes less than 2 seconds.  Neurological:     Mental Status: She is alert and oriented to person, place, and time.     Motor: No abnormal muscle tone.      ____________________________________________   LABS (all labs ordered are listed, but only abnormal results are displayed)  Labs Reviewed  COMPREHENSIVE METABOLIC PANEL - Abnormal; Notable for the following components:      Result Value   Glucose, Bld 111 (*)    All other components within normal limits  URINALYSIS, ROUTINE W REFLEX MICROSCOPIC - Abnormal; Notable for the following components:   Color, Urine COLORLESS (*)    APPearance CLEAR (*)    Specific Gravity, Urine 1.004 (*)    All other  components within normal limits  LACTIC ACID, PLASMA  CBC WITH DIFFERENTIAL/PLATELET  PREGNANCY, URINE    ____________________________________________  EKG:  ________________________________________  RADIOLOGY All imaging, including plain films, CT scans, and ultrasounds, independently reviewed by me, and interpretations confirmed via formal radiology reads.  ED MD interpretation:   CT A/P: Terminal ileitis, mild right-sided hydro likely from reactive urethritis  Official radiology report(s): CT ABDOMEN PELVIS W CONTRAST  Result Date: 03/31/2021 CLINICAL DATA:  Right lower quadrant abdominal pain EXAM: CT ABDOMEN AND PELVIS WITH CONTRAST TECHNIQUE: Multidetector CT imaging of the abdomen and pelvis was performed using the standard protocol following bolus administration of intravenous contrast. CONTRAST:  17mL OMNIPAQUE IOHEXOL 300 MG/ML  SOLN COMPARISON:  None. FINDINGS: Lower chest: Included lung bases are clear. Heart size is normal. Moderate sized hiatal hernia. Hepatobiliary: No focal liver abnormality is seen. No gallstones, gallbladder wall thickening, or biliary dilatation. Pancreas: Unremarkable. No pancreatic ductal dilatation or surrounding inflammatory changes. Spleen: Normal in size without focal abnormality. Adrenals/Urinary Tract: Unremarkable adrenal glands. Bilateral kidneys enhance symmetrically. No focal renal lesion. Mild right-sided hydroureteronephrosis. No ureteral calculi. Right ureter closely approximates the inflamed terminal ileum. Mild urothelial enhancement along the mid right ureter at this location. Distal right ureter is unremarkable. No left-sided hydronephrosis. Urinary bladder is unremarkable. Stomach/Bowel: Moderate hiatal hernia. Stomach otherwise unremarkable. Approximately 5 cm segment of the terminal ileum is circumferentially thickened with adjacent fat stranding (series 2, images 50-52). The distal ileum just upstream from this level is fluid-filled,  although non dilated. No evidence of obstruction. A noninflamed appendix is present along the posterior aspect of the cecum (series 5, image 45). Moderate volume stool throughout the colon. Vascular/Lymphatic: No significant vascular findings are present. No enlarged abdominal or pelvic lymph nodes. Reproductive: Status post hysterectomy. No adnexal masses. Other: No free fluid. No abdominopelvic fluid collection. No pneumoperitoneum. No abdominal wall hernia. Musculoskeletal: No acute or significant osseous findings. IMPRESSION: 1. Approximately 5 cm segment of  circumferentially thickened terminal ileum with adjacent fat stranding, suggestive of an infectious or inflammatory enteritis including inflammatory bowel disease such as Crohn's disease. 2. Mild right-sided hydroureteronephrosis is favored reactive as the right ureter closely approximates the inflamed terminal ileum. Mild urothelial enhancement along the mid right ureter at this location. No renal or ureteral stone. 3. Moderate hiatal hernia. 4. Moderate colonic stool burden. Electronically Signed   By: Davina Poke D.O.   On: 03/31/2021 13:49    ____________________________________________  PROCEDURES   Procedure(s) performed (including Critical Care):  Procedures  ____________________________________________  INITIAL IMPRESSION / MDM / [place] / ED COURSE  As part of my medical decision making, I reviewed the following data within the [facility] notes reviewed and incorporated, Old chart reviewed, Notes from prior ED visits, and [place] Controlled Substance Database       *Morgan [name] was evaluated in Emergency Department on 03/31/2021 for the symptoms described in the history of present illness. She was evaluated in the context of the global COVID-19 pandemic, which necessitated consideration that the patient might be at risk for infection with the SARS-CoV-2 virus that causes COVID-19.  Institutional protocols and algorithms that pertain to the evaluation of patients at risk for COVID-19 are in a state of rapid change based on information released by regulatory bodies including the CDC and federal and state organizations. These policies and algorithms were followed during the patient's care in the ED.  Some ED evaluations and interventions may be delayed as a result of limited staffing during the pandemic.*     Medical Decision Making: 45 year old female here with right-sided abdominal pain.  Initial differential broad including appendicitis, ileitis, colitis, cholecystitis, atypical gastritis.  Initial lab work reviewed, shows normal white count and hemoglobin.  CMP unremarkable with normal LFTs and renal function.  Lactic acid is normal.  No signs suggest ischemic colitis.  CT of the abdomen and pelvis obtained, reviewed as above.  Patient does have a focal segment of terminal ileitis.  Differential includes Crohn's disease and she does have a long history of recurrent GI issues, has never had a colonoscopy though she is actually scheduled for this with Dr. Allen Norris as an outpatient.  I reviewed her EGD from 2019 which was largely unremarkable.  Will plan to treat for ileitis, possibly infectious, as well as treat supportively.  I urged a bowel regimen for her stool burden.  She will return with any worsening pain, fever, vomiting, or concerning symptoms.  ____________________________________________  FINAL CLINICAL IMPRESSION(S) / ED DIAGNOSES  Final diagnoses:  Terminal ileitis without complication ([place])     MEDICATIONS GIVEN DURING THIS VISIT:  Medications  morphine 4 MG/ML injection 6 mg (6 mg Intravenous Given 03/31/21 1258)  ondansetron (ZOFRAN) injection 4 mg (4 mg Intravenous Given 03/31/21 1257)  sodium chloride 0.9 % bolus 1,000 mL (1,000 mLs Intravenous New Bag/Given 03/31/21 1256)  iohexol (OMNIPAQUE) 300 MG/ML solution 100 mL (100 mLs Intravenous Contrast Given  03/31/21 1327)  dicyclomine (BENTYL) capsule 10 mg (10 mg Oral Given 03/31/21 1437)  HYDROcodone-acetaminophen (NORCO/VICODIN) 5-325 MG per tablet 1 tablet (1 tablet Oral Given 03/31/21 1437)  alum & mag hydroxide-simeth (MAALOX/MYLANTA) 200-200-20 MG/5ML suspension 30 mL (30 mLs Oral Given 03/31/21 1438)    And  lidocaine (XYLOCAINE) 2 % viscous mouth solution 15 mL (15 mLs Oral Given 03/31/21 1438)     ED Discharge Orders          Ordered    sucralfate (CARAFATE) 1  g tablet  3 times daily with meals & bedtime        Pending    ciprofloxacin (CIPRO) 500 MG tablet  2 times daily        Pending    metroNIDAZOLE (FLAGYL) 500 MG tablet  3 times daily        Pending    ondansetron (ZOFRAN-ODT) 4 MG disintegrating tablet  Every 8 hours PRN        Pending    dicyclomine (BENTYL) 20 MG tablet  3 times daily PRN        Pending             Note:  This document was prepared using Dragon voice recognition software and may include unintentional dictation errors.   Duffy Bruce, MD 03/31/21 [id number]

## 2021-03-31 NOTE — ED Triage Notes (Signed)
C/O right flank pain, ongoning x 3 weeks.  Has been by Dr. Toney Reil for same, given a 'sulfa medication'.  Initially symptoms improved, but returned this morning.  Denies dysuria.  Last [facility] 03/31/2021

## 2021-03-31 NOTE — Telephone Encounter (Signed)
Please see note below access note; pt is already at [facility] ED.

## 2021-03-31 NOTE — ED Notes (Signed)
MD at bedside going over results with patient and patients husband

## 2021-03-31 NOTE — Telephone Encounter (Signed)
I spoke with pt and she said she is at [facility] ED now; nothing further needed at this time. Pt is going to be eval and any needed testing. Sending note to Dr Patsy Lager as Lorain Childes and Anastasiya CMA.  Pt previously seen 03/19/21 appt with labs and DG abd 2 views.

## 2021-03-31 NOTE — Discharge Instructions (Addendum)
Call Dr. Annabell Sabal office to set up an appointment  FOR CONSTIPATION: - I'd recommend starting the following over-the-counter medications - Colace (stool softener) 100 mg daily, every day - Miralax - mix one cap full in 6-8 oz of fluid, take this up to three times daily until BMs are soft, then cut back to once daily and more as needed  Take the medications as prescribed  Return to the ER as needed

## 2021-03-31 NOTE — Telephone Encounter (Signed)
Agree with dispo 

## 2021-03-31 NOTE — Telephone Encounter (Signed)
[facility] Day - Client TELEPHONE ADVICE RECORD AccessNurse Patient Name: Morgan [name] Gender: Female DOB: 01-08-1976 Age: 45 Y 8 M 5 D Return Phone Number: [phone] (Primary) Address: City/ State/ Zip: [place] [address] Client [facility] Day - Client Client Site [facility] - Day Provider Audria Nine- NP Contact Type Call Who Is Calling Patient / Member / Family / Caregiver Call Type Triage / Clinical Relationship To Patient Self Return Phone Number [phone] (Primary) Chief Complaint SEVERE ABDOMINAL PAIN - Severe pain in abdomen Reason for Call Symptomatic / Request for Health Information Initial Comment Caller states she is having severe abdominal pain and bloating. Caller states she was on Sulfur medication and she finished it last week. Additional Comment Caller was transferred over from office for triage. Translation No Nurse Assessment Nurse: Carylon Perches, RN, Hilda Lias Date/Time Lamount [name] Time): 03/31/2021 9:49:13 AM Confirm and document reason for call. If symptomatic, describe symptoms. ---Caller states she is having severe abdominal pain and bloating. Pain is from MQ to RMQ. She's bent over with the painCaller states she was on Sulfur medication and she finished it last week. States its like a severe period cramp, but had a hysterectomy. Does the patient have any new or worsening symptoms? ---Yes Will a triage be completed? ---Yes Related visit to physician within the last 2 weeks? ---Yes Does the PT have any chronic conditions? (i.e. diabetes, asthma, this includes High risk factors for pregnancy, etc.) ---Yes List chronic conditions. ---HTN, hyperlipidemia Is the patient pregnant or possibly pregnant? (Ask all females between the ages of 55-55) ---No Is this a behavioral health or substance abuse call? ---No Guidelines Guideline Title Affirmed Question Affirmed Notes Nurse Date/Time  Lamount [name] Time) Abdominal Pain - Female [1] SEVERE pain (e.g., excruciating) Carylon Perches, RN, Hilda Lias 03/31/2021 9:51:41 AM PLEASE NOTE: All timestamps contained within this report are represented as [place] Standard Time. CONFIDENTIALTY NOTICE: This fax transmission is intended only for the addressee. It contains information that is legally privileged, confidential or otherwise protected from use or disclosure. If you are not the intended recipient, you are strictly prohibited from reviewing, disclosing, copying using or disseminating any of this information or taking any action in reliance on or regarding this information. If you have received this fax in error, please notify us immediately by telephone so that we can arrange for its return to [place]. Phone: [phone], Toll-Free: [phone], Fax: [phone] Page: 2 of 2 Call Id: [id number] Guidelines Guideline Title Affirmed Question Affirmed Notes Nurse Date/Time Lamount [name] Time) AND [2] present > 1 hour Disp. Time Lamount [name] Time) Disposition Final User 03/31/2021 9:47:31 AM Send to Urgent Sharlyn Bologna 03/31/2021 9:52:48 AM Go to ED Now Yes Carylon Perches, RN, [place]ard Grater Disagree/Comply Comply Caller Understands Yes PreDisposition Did not know what to do Care Advice Given Per Guideline GO TO ED NOW: * You need to be seen in the Emergency Department. * Leave now. Drive carefully. ANOTHER ADULT SHOULD DRIVE: * It is better and safer if another adult drives instead of you. CARE ADVICE given per Abdominal Pain, Female (Adult) guideline. BRING MEDICINES: * Bring a list of your current medicines when you go to the Emergency Department (ER). Referrals [facility] - E

## 2021-04-01 ENCOUNTER — Telehealth: Payer: Self-pay | Admitting: Gastroenterology

## 2021-04-01 NOTE — Telephone Encounter (Signed)
Inbound call from pt requesting a call back stating that she was was seen in the E.R. and the doctor recommended her to get a colonoscopy soon as possible. She stated they found some abnormal findings in her CT scan. Please advise. Thank you.

## 2021-04-01 NOTE — Telephone Encounter (Signed)
Returned pt's call regarding recent ER visit. Message has been forwarded to Dr. Servando Snare for recommendation on either a colonoscopy or office visit.

## 2021-04-02 ENCOUNTER — Other Ambulatory Visit: Payer: Self-pay

## 2021-04-02 ENCOUNTER — Encounter: Payer: Self-pay | Admitting: Gastroenterology

## 2021-04-02 DIAGNOSIS — K50019 Crohn's disease of small intestine with unspecified complications: Secondary | ICD-10-CM

## 2021-04-02 MED ORDER — CLENPIQ 10-3.5-12 MG-GM -GM/160ML PO SOLN: 320.0000 mL | ORAL | 0 refills | Status: DC

## 2021-04-02 NOTE — Telephone Encounter (Signed)
Returned pt's call and scheduled her for a colonoscopy with Dr. Servando Snare at [facility] on 04/04/21.

## 2021-04-02 NOTE — Telephone Encounter (Signed)
Pt called back to inquire about yesterday's phone call

## 2021-04-04 ENCOUNTER — Ambulatory Visit
Admission: RE | Admit: 2021-04-04 | Discharge: 2021-04-04 | Disposition: A | Discharge status: Home / Self Care | Payer: BC Managed Care – PPO | Source: Ambulatory Visit | Attending: Gastroenterology | Admitting: Gastroenterology

## 2021-04-04 ENCOUNTER — Encounter: Payer: Self-pay | Admitting: Gastroenterology

## 2021-04-04 ENCOUNTER — Ambulatory Visit: Payer: BC Managed Care – PPO | Admitting: Anesthesiology

## 2021-04-04 ENCOUNTER — Encounter
Admission: RE | Disposition: A | Discharge status: Home / Self Care | Payer: Self-pay | Source: Ambulatory Visit | Attending: Gastroenterology

## 2021-04-04 ENCOUNTER — Other Ambulatory Visit: Payer: Self-pay

## 2021-04-04 DIAGNOSIS — R599 Enlarged lymph nodes, unspecified: Secondary | ICD-10-CM | POA: Diagnosis not present

## 2021-04-04 DIAGNOSIS — K640 First degree hemorrhoids: Secondary | ICD-10-CM | POA: Diagnosis not present

## 2021-04-04 DIAGNOSIS — R933 Abnormal findings on diagnostic imaging of other parts of digestive tract: Secondary | ICD-10-CM | POA: Insufficient documentation

## 2021-04-04 DIAGNOSIS — R935 Abnormal findings on diagnostic imaging of other abdominal regions, including retroperitoneum: Secondary | ICD-10-CM | POA: Diagnosis not present

## 2021-04-04 DIAGNOSIS — K5000 Crohn's disease of small intestine without complications: Secondary | ICD-10-CM | POA: Diagnosis not present

## 2021-04-04 DIAGNOSIS — K50019 Crohn's disease of small intestine with unspecified complications: Secondary | ICD-10-CM

## 2021-04-04 HISTORY — PX: COLONOSCOPY WITH PROPOFOL: SHX5780

## 2021-04-04 SURGERY — COLONOSCOPY WITH PROPOFOL
Anesthesia: General | Site: Rectum

## 2021-04-04 MED ORDER — SODIUM CHLORIDE 0.9 % IV SOLN: INTRAVENOUS | Status: DC

## 2021-04-04 MED ORDER — STERILE WATER FOR IRRIGATION IR SOLN
Status: DC | PRN
  Administered 2021-04-04: 1

## 2021-04-04 MED ORDER — PROPOFOL 10 MG/ML IV BOLUS
INTRAVENOUS | Status: DC | PRN
  Administered 2021-04-04: 50 mg via INTRAVENOUS
  Administered 2021-04-04 (×4): 40 mg via INTRAVENOUS
  Administered 2021-04-04: 150 mg via INTRAVENOUS

## 2021-04-04 MED ORDER — LACTATED RINGERS IV SOLN: INTRAVENOUS | Status: DC

## 2021-04-04 MED ORDER — LIDOCAINE HCL (CARDIAC) PF 100 MG/5ML IV SOSY
PREFILLED_SYRINGE | INTRAVENOUS | Status: DC | PRN
  Administered 2021-04-04: 30 mg via INTRAVENOUS

## 2021-04-04 SURGICAL SUPPLY — 7 items
FORCEPS BIOP RAD 4 LRG CAP 4 (CUTTING FORCEPS) ×2 IMPLANT
GOWN CVR UNV OPN BCK APRN NK (MISCELLANEOUS) ×2 IMPLANT
GOWN ISOL THUMB LOOP REG UNIV (MISCELLANEOUS) ×6
KIT PRC NS LF DISP ENDO (KITS) ×1 IMPLANT
KIT PROCEDURE OLYMPUS (KITS) ×3
MANIFOLD NEPTUNE II (INSTRUMENTS) ×3 IMPLANT
WATER STERILE IRR 250ML POUR (IV SOLUTION) ×3 IMPLANT

## 2021-04-04 NOTE — H&P (Signed)
Morgan Lame, MD [address] [place], [place] [address] [phone] Fax : [phone]  Primary Care Physician:  Michela Pitcher, NP Primary Gastroenterologist:  Dr. Allen Norris  Pre-Procedure History & Physical: HPI:  Morgan [name] is a 45 y.o. female is here for an colonoscopy.   Past Medical History:  Diagnosis Date   Complication of anesthesia    STATES TOLD REINTUBATION WITH HYSTERECTOMY   Difficult intubation    Pt unclear of what difficulty was.  Reports it was with 2010 back surgery and with hemi-thyroidectomy.  No problems in other surgeries.   GERD (gastroesophageal reflux disease)    Headache    MIGRAINES - improved with Ajovy   History of hiatal hernia    Hypertension    PONV (postoperative nausea and vomiting)    Stiff neck    mild limitations of up/down mvmt s/p neck surgery    Past Surgical History:  Procedure Laterality Date   ABDOMINAL HYSTERECTOMY     BACK SURGERY  03/2020   lower back   BALLOON DILATION N/A 04/18/2018   Procedure: BALLOON DILATION;  Surgeon: Morgan Lame, MD;  Location: [place] [facility];  Service: Endoscopy;  Laterality: N/A;   ESOPHAGOGASTRODUODENOSCOPY (EGD) WITH PROPOFOL N/A 04/18/2018   Procedure: ESOPHAGOGASTRODUODENOSCOPY (EGD) WITH BIOPSIES;  Surgeon: Morgan Lame, MD;  Location: [place];  Service: Endoscopy;  Laterality: N/A;   NECK SURGERY     FUSION C5-C7   OOPHORECTOMY     THYROIDECTOMY Left    (Hemi-)   TRACHELECTOMY N/A 10/15/2017   Procedure: TRACHELECTOMY;  Surgeon: Schermerhorn, Gwen Her, MD;  Location: [facility] ORS;  Service: Gynecology;  Laterality: N/A;   TUBAL LIGATION      Prior to Admission medications   Medication Sig Start Date End Date Taking? Authorizing Provider  AJOVY 225 MG/1.5ML SOAJ Inject into the skin. 03/10/21  Yes [provider]  atorvastatin (LIPITOR) 10 MG tablet Take 1 tablet (10 mg total) by mouth daily. 01/06/21  Yes Michela Pitcher, NP   benazepril-hydrochlorthiazide (LOTENSIN HCT) 10-12.5 MG tablet Take 1 tablet by mouth daily. 01/06/21  Yes Michela Pitcher, NP  cyclobenzaprine (FLEXERIL) 10 MG tablet Take 2 tablets (20 mg total) by mouth at bedtime. 01/06/21  Yes Michela Pitcher, NP  gabapentin (NEURONTIN) 300 MG capsule Take 1 capsule (300 mg total) by mouth 2 (two) times daily. 01/06/21  Yes Michela Pitcher, NP  nortriptyline (PAMELOR) 10 MG capsule Take 10 mg by mouth every morning.   Yes [provider]  nortriptyline (PAMELOR) 50 MG capsule Take 50 mg by mouth at bedtime.   Yes [provider]  omeprazole (PRILOSEC) 40 MG capsule TAKE 1 CAPSULE BY MOUTH  TWICE DAILY 06/05/20  Yes Baity, Coralie Keens, NP  QUEtiapine (SEROQUEL) 25 MG tablet Take 50 mg by mouth at bedtime. 04/16/20  Yes [provider]  rizatriptan (MAXALT) 10 MG tablet Take by mouth. 1 tablet by mouth as directed 01/02/21 01/02/22 Yes [provider]  valACYclovir (VALTREX) 500 MG tablet Take 1 tablet (500 mg total) by mouth daily. 01/06/21  Yes Michela Pitcher, NP  Sod Picosulfate-Mag Ox-Cit Acd (CLENPIQ) 10-3.5-12 MG-GM -GM/160ML SOLN Take 320 mLs by mouth as directed. 04/02/21   Morgan Lame, MD  venlafaxine (EFFEXOR) 37.5 MG tablet Take 1 tablet by mouth daily. For 1 week and then increase to 2 times daily Patient not taking: Reported on 04/02/2021 12/24/20   [provider]    Allergies as of 04/02/2021 -  Review Complete 04/02/2021  Allergen Reaction Noted   Adhesive [tape] Other (See Comments) 09/24/2017   Latex Other (See Comments) 09/24/2017   Lexapro [escitalopram oxalate] Other (See Comments) 10/15/2016   Metoprolol Other (See Comments) 10/29/2016   Topamax [topiramate]  10/15/2016    Family History  Problem Relation Age of Onset   Hypertension Mother    Arthritis Mother    Arthritis Father    Stroke Father    Hypertension Father    Heart disease Maternal Uncle    Arthritis Maternal Grandmother    Heart  disease Maternal Grandmother    Hypertension Maternal Grandmother    Arthritis Maternal Grandfather    Lung cancer Maternal Grandfather    Heart disease Maternal Grandfather    Arthritis Paternal Grandmother    Heart disease Paternal Grandmother    Hypertension Paternal Grandmother    Arthritis Paternal Grandfather    Heart disease Paternal Grandfather    Stroke Paternal Grandfather     Social History   Socioeconomic History   Marital status: Married    Spouse name: Not on file   Number of children: Not on file   Years of education: Not on file   Highest education level: Not on file  Occupational History   Not on file  Tobacco Use   Smoking status: Former    Types: Cigarettes    Quit date: 09/30/2016    Years since quitting: 4.5   Smokeless tobacco: Never  Vaping Use   Vaping Use: Never used  Substance and Sexual Activity   Alcohol use: Not Currently    Comment: occasional   Drug use: No   Sexual activity: Yes  Other Topics Concern   Not on file  Social History Narrative   Not on file   Social Determinants of Health   Financial Resource Strain: Not on file  Food Insecurity: Not on file  Transportation Needs: Not on file  Physical Activity: Not on file  Stress: Not on file  Social Connections: Not on file  Intimate Partner Violence: Not on file    Review of Systems: See HPI, otherwise negative ROS  Physical Exam: BP 138/77    Pulse 95    Temp 98.1 F (36.7 C) (Temporal)    Resp 16    Ht 5' 5.75" (1.67 m)    Wt 75.7 kg    SpO2 100%    BMI 27.13 kg/m  General:   Alert,  pleasant and cooperative in NAD Head:  Normocephalic and atraumatic. Neck:  Supple; no masses or thyromegaly. Lungs:  Clear throughout to auscultation.    Heart:  Regular rate and rhythm. Abdomen:  Soft, nontender and nondistended. Normal bowel sounds, without guarding, and without rebound.   Neurologic:  Alert and  oriented x4;  grossly normal neurologically.  Impression/Plan: [name] is here for an colonoscopy to be performed for abnormal CT scan of the GI tract  Risks, benefits, limitations, and alternatives regarding  colonoscopy have been reviewed with the patient.  Questions have been answered.  All parties agreeable.   [name], MD  04/04/2021, 10:24 AM

## 2021-04-04 NOTE — Anesthesia Postprocedure Evaluation (Signed)
Anesthesia Post Note  Patient: Morgan [name]  Procedure(s) Performed: COLONOSCOPY WITH BIOPSY (Rectum)     Patient location during evaluation: PACU Anesthesia Type: General Level of consciousness: awake and alert Pain management: pain level controlled Vital Signs Assessment: post-procedure vital signs reviewed and stable Respiratory status: spontaneous breathing, nonlabored ventilation, respiratory function stable and patient connected to nasal cannula oxygen Cardiovascular status: blood pressure returned to baseline and stable Postop Assessment: no apparent nausea or vomiting Anesthetic complications: no   No notable events documented.  Alta Corning

## 2021-04-04 NOTE — Anesthesia Preprocedure Evaluation (Signed)
Anesthesia Evaluation  Patient identified by MRN, date of birth, ID band Patient awake    Reviewed: Allergy & Precautions, H&P , NPO status , Patient's Chart, lab work & pertinent test results, reviewed documented beta blocker date and time   History of Anesthesia Complications (+) PONV, DIFFICULT AIRWAY and history of anesthetic complications  Airway Mallampati: II  TM Distance: >3 FB Neck ROM: full    Dental no notable dental hx.    Pulmonary former smoker,    Pulmonary exam normal breath sounds clear to auscultation       Cardiovascular Exercise Tolerance: Good hypertension, negative cardio ROS Normal cardiovascular exam Rhythm:regular Rate:Normal     Neuro/Psych negative neurological ROS  negative psych ROS   GI/Hepatic Neg liver ROS, hiatal hernia, GERD  Controlled,  Endo/Other  negative endocrine ROS  Renal/GU negative Renal ROS  negative genitourinary   Musculoskeletal   Abdominal   Peds  Hematology negative hematology ROS (+)   Anesthesia Other Findings   Reproductive/Obstetrics negative OB ROS                             Anesthesia Physical Anesthesia Plan  ASA: 2  Anesthesia Plan: General   Post-op Pain Management:    Induction:   PONV Risk Score and Plan:   Airway Management Planned:   Additional Equipment:   Intra-op Plan:   Post-operative Plan:   Informed Consent: I have reviewed the patients History and Physical, chart, labs and discussed the procedure including the risks, benefits and alternatives for the proposed anesthesia with the patient or authorized representative who has indicated his/her understanding and acceptance.     Dental Advisory Given  Plan Discussed with: CRNA and Anesthesiologist  Anesthesia Plan Comments:         Anesthesia Quick Evaluation

## 2021-04-04 NOTE — Transfer of Care (Signed)
Immediate Anesthesia Transfer of Care Note  Patient: Morgan [name]  Procedure(s) Performed: COLONOSCOPY WITH BIOPSY (Rectum)  Patient Location: PACU  Anesthesia Type: General  Level of Consciousness: awake, alert  and patient cooperative  Airway and Oxygen Therapy: Patient Spontanous Breathing and Patient connected to supplemental oxygen  Post-op Assessment: Post-op Vital signs reviewed, Patient's Cardiovascular Status Stable, Respiratory Function Stable, Patent Airway and No signs of Nausea or vomiting  Post-op Vital Signs: Reviewed and stable  Complications: No notable events documented.

## 2021-04-04 NOTE — Op Note (Signed)
[facility] Gastroenterology Patient Name: Morgan [name] Procedure Date: 04/04/2021 10:44 AM MRN: [id number] Account #: [id number] Date of Birth: 12-26-1975 Admit Type: Outpatient Age: 45 Room: [facility] OR ROOM 01 Gender: Female Note Status: Finalized Instrument Name: [id number] Procedure:             Colonoscopy Indications:           Abnormal CT of the GI tract Providers:             [name] MD, MD Referring MD:          Genene Churn. Cable (Referring MD) Medicines:             Propofol per Anesthesia Complications:         No immediate complications. Procedure:             Pre-Anesthesia Assessment:                        - Prior to the procedure, a History and Physical was                         performed, and patient medications and allergies were                         reviewed. The patient's tolerance of previous                         anesthesia was also reviewed. The risks and benefits                         of the procedure and the sedation options and risks                         were discussed with the patient. All questions were                         answered, and informed consent was obtained. Prior                         Anticoagulants: The patient has taken no previous                         anticoagulant or antiplatelet agents. ASA Grade                         Assessment: II - A patient with mild systemic disease.                         After reviewing the risks and benefits, the patient                         was deemed in satisfactory condition to undergo the                         procedure.                        After obtaining informed consent, the colonoscope was  passed under direct vision. Throughout the procedure,                         the patient's blood pressure, pulse, and oxygen                         saturations were monitored continuously. The                         Colonoscope was introduced  through the anus and                         advanced to the the terminal ileum. The colonoscopy                         was performed without difficulty. The patient                         tolerated the procedure well. The quality of the bowel                         preparation was excellent. Findings:      The perianal and digital rectal examinations were normal.      The terminal ileum appeared normal. Biopsies were taken with a cold       forceps for histology.      Non-bleeding internal hemorrhoids were found during retroflexion. The       hemorrhoids were Grade I (internal hemorrhoids that do not prolapse). Impression:            - The examined portion of the ileum was normal.                         Biopsied.                        - Non-bleeding internal hemorrhoids. Recommendation:        - Discharge patient to home.                        - Resume previous diet.                        - Continue present medications.                        - Repeat colonoscopy in 10 years for screening                         purposes. Procedure Code(s):     --- Professional ---                        [id number], Colonoscopy, flexible; with biopsy, single or                         multiple Diagnosis Code(s):     --- Professional ---                        R93.3, Abnormal findings on diagnostic imaging of  other parts of digestive tract CPT copyright 2019 American Medical Association. All rights reserved. The codes documented in this report are preliminary and upon coder review may  be revised to meet current compliance requirements. [name] MD, MD 04/04/2021 11:24:56 AM This report has been signed electronically. Number of Addenda: 0 Note Initiated On: 04/04/2021 10:44 AM Scope Withdrawal Time: 0 hours 9 minutes 20 seconds  Total Procedure Duration: 0 hours 17 minutes 38 seconds  Estimated Blood Loss:  Estimated blood loss: none.      [facility]

## 2021-04-07 ENCOUNTER — Encounter: Payer: Self-pay | Admitting: Gastroenterology

## 2021-04-08 LAB — SURGICAL PATHOLOGY

## 2021-04-10 ENCOUNTER — Telehealth: Payer: Self-pay

## 2021-04-10 NOTE — Telephone Encounter (Signed)
Pt notified of results through MyChart.  °

## 2021-04-10 NOTE — Telephone Encounter (Signed)
-----   Message from [name], MD sent at 04/08/2021 11:54 AM EST ----- Please let the patient know that the biopsies of her terminal ileum showed no active inflammation or malignancy and no signs of Crohn's disease or an infection.

## 2021-04-24 IMAGING — MR MR LUMBAR SPINE W/O CM
5 series · 31 of 48 positions shown · non-contrast
Comparison: None.

CLINICAL DATA: Right-sided low back pain radiating down right leg.

EXAM:
MRI LUMBAR SPINE WITHOUT CONTRAST
TECHNIQUE: Multiplanar, multisequence MR imaging of the lumbar spine was
performed. No intravenous contrast was administered.

[Series 5: T2 · sagittal · 4.0mm · 0.81mm/px · 6 of 17 slices shown (1 of 2)]
[im 1/17]
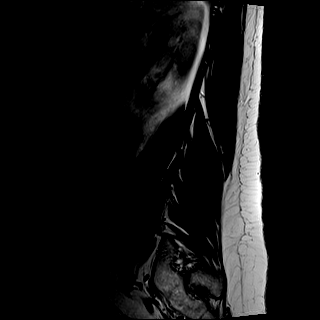
[im 4/17]
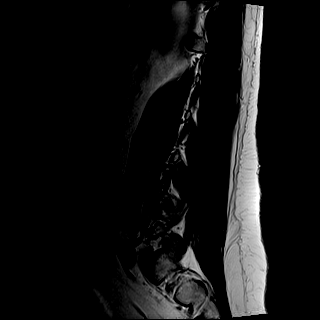
[im 7/17]
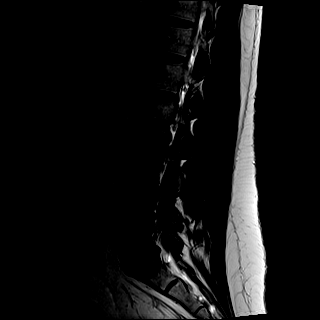
[im 10/17]
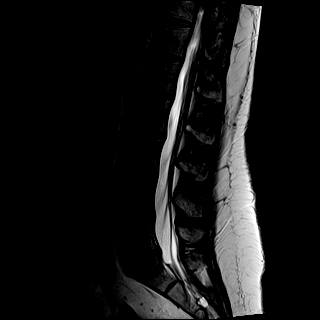
[im 13/17]
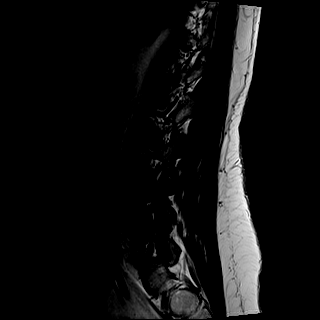
[im 17/17]
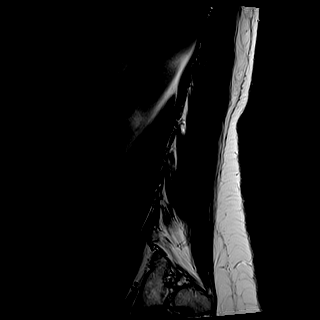

[Series 6: T1 · sagittal · 4.0mm · 0.81mm/px · 7 of 17 slices shown (1 of 2)]
[im 1/17]
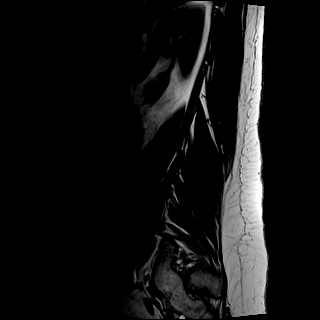
[im 3/17]
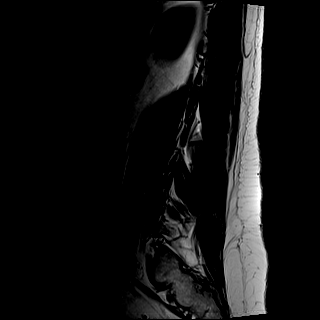
[im 6/17]
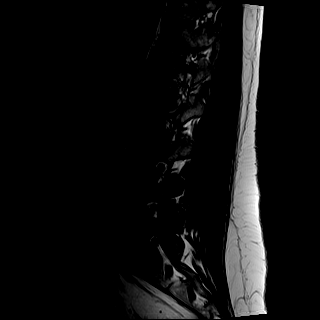
[im 9/17]
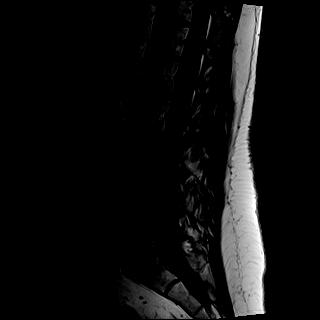
[im 11/17]
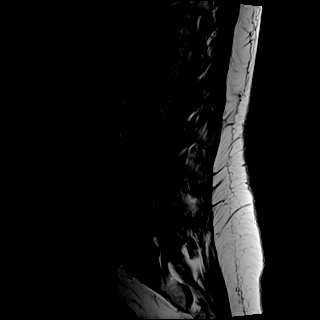
[im 14/17]
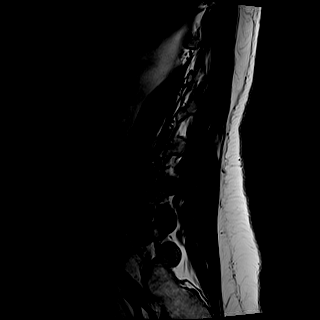
[im 17/17]
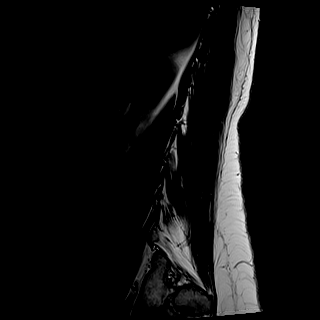

[Series 7: STIR · sagittal · 4.0mm · 0.41mm/px · 2 of 17 slices shown]
[im 1/17]
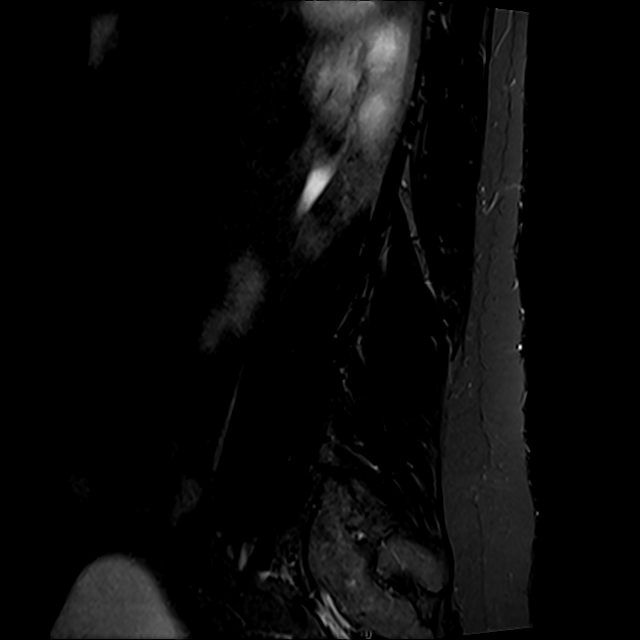
[im 3/17]
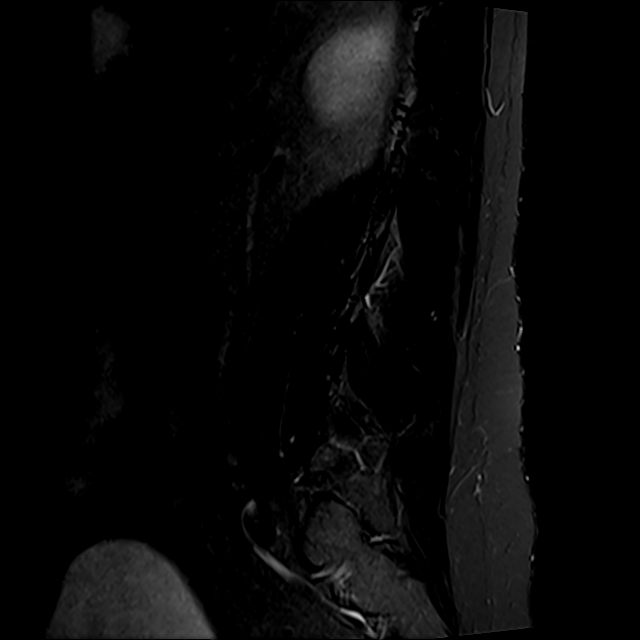

[Series 8: T2 · axial · 4.0mm · 0.78mm/px · z∈[-141,+68]mm · 8 of 36 slices shown (2 of 2)]
[im 1/36]
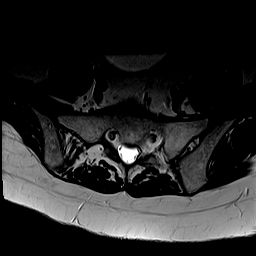
[im 6/36]
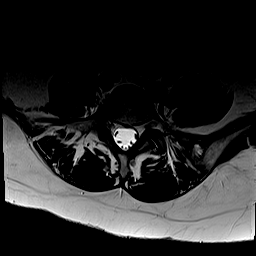
[im 11/36]
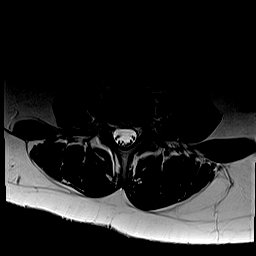
[im 17/36]
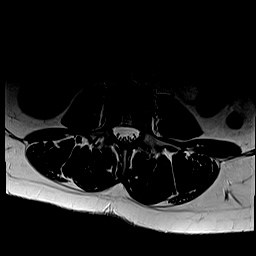
[im 19/36]
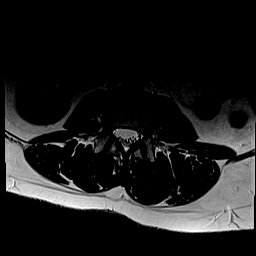
[im 25/36]
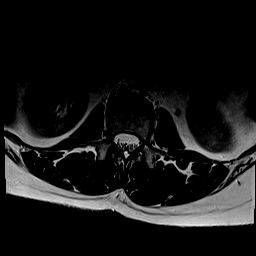
[im 30/36]
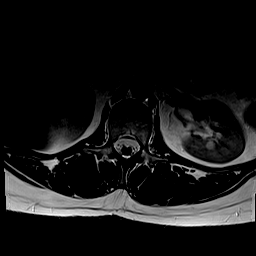
[im 36/36]
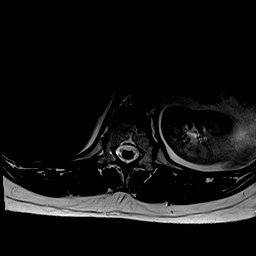

[Series 9: T1 · axial · 4.0mm · 0.39mm/px · z∈[-141,+68]mm · 8 of 36 slices shown (2 of 2)]
[im 1/36]
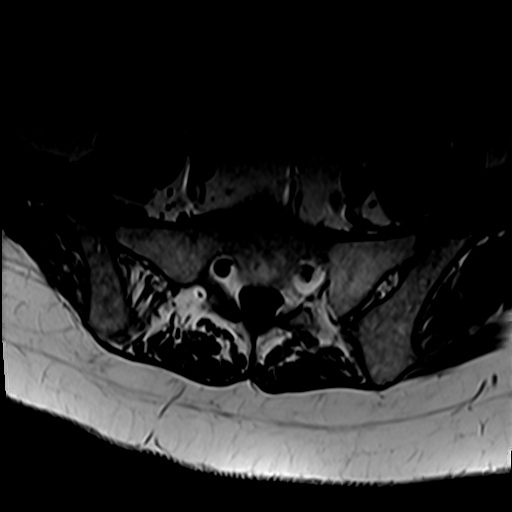
[im 6/36]
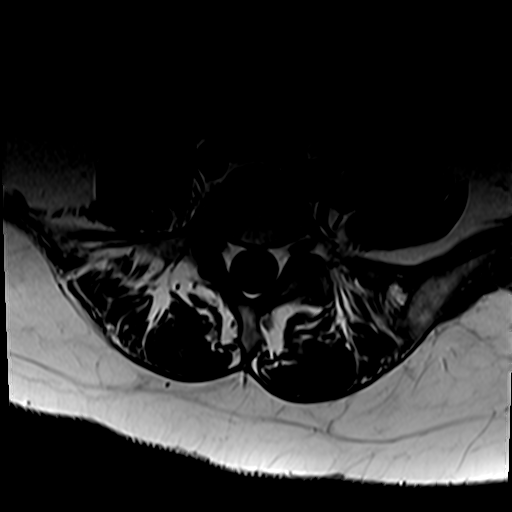
[im 11/36]
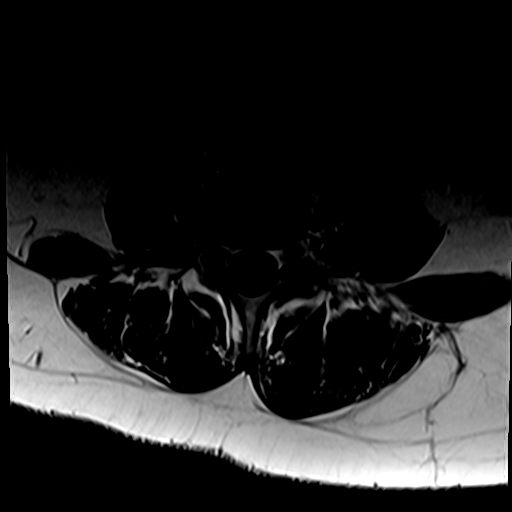
[im 17/36]
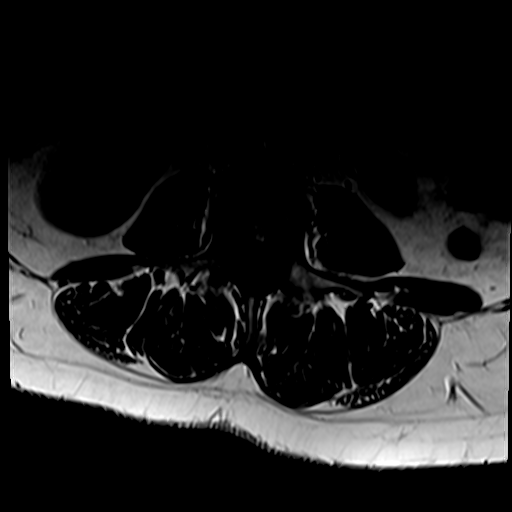
[im 19/36]
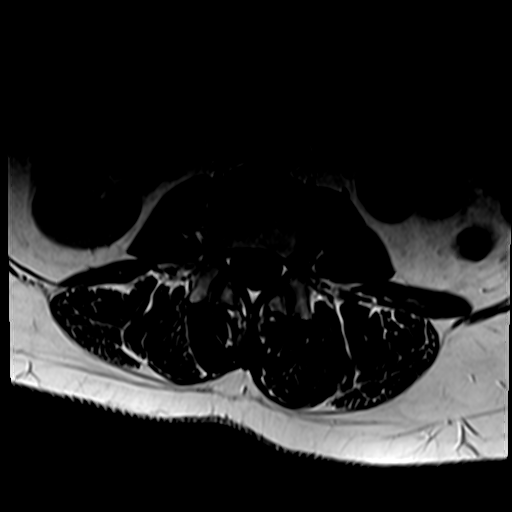
[im 25/36]
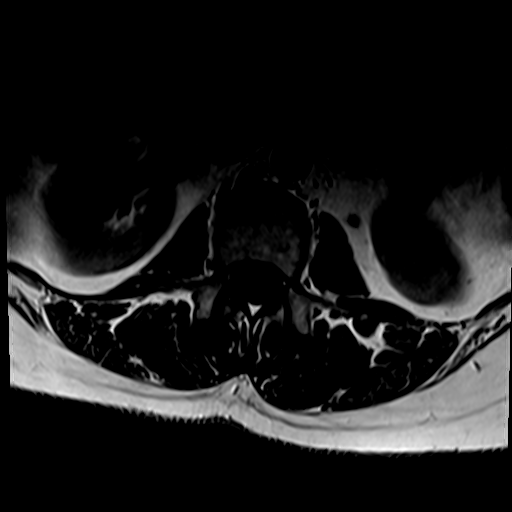
[im 30/36]
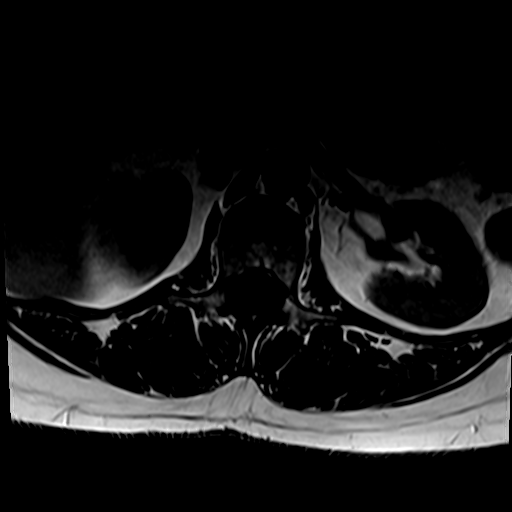
[im 36/36]
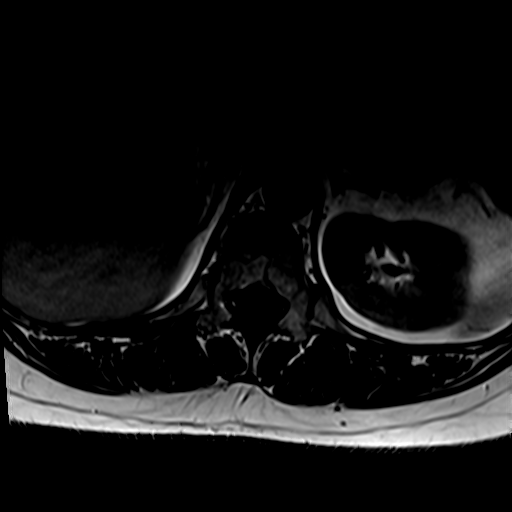

[31 of 48 positions shown; findings below may reference images not displayed]

FINDINGS: Segmentation: Standard. The inferior-most fully formed
intervertebral disc is labeled L5-S1.

Alignment:  Physiologic.

Vertebrae: Vertebral body heights are maintained. No evidence of
acute fracture, discitis/osteomyelitis, or suspicious bone lesion.

Conus medullaris and cauda equina: Conus extends to the L2 level.
Conus and cauda equina appear normal.

Paraspinal and other soft tissues: Unremarkable.

Disc levels:

T12-L1: No significant disc protrusion, foraminal stenosis, or canal
stenosis.

L1-L2: No significant disc protrusion, foraminal stenosis, or canal
stenosis.

L2-L3: Mild broad disc bulge with superimposed left foraminal disc
protrusion. Resulting mild left foraminal stenosis without
significant canal stenosis.

L3-L4: Minimal disc bulge without significant canal or foraminal
stenosis.

L4-L5: Disc desiccation and height loss. Mild broad disc bulge with
superimposed far lateral/extraforaminal disc herniation. Resulting
moderate right foraminal stenosis with disc contacting the far
lateral/exited right L5 nerve (see series 5, image 4 and series 8,
image 27). No significant canal stenosis.

L5-S1: Disc desiccation and height loss. Broad disc bulge with mild
left foraminal stenosis. No significant canal stenosis.
IMPRESSION: 1. At L4-L5 there is a far lateral/extraforaminal disc protrusion
which results in moderate right foraminal stenosis with disc
contacting the far lateral/exited right L5 nerve.
2. Mild foraminal stenosis on the left at L2-L3 and L5-S1 with left
foraminal disc protrusion at L2-L3.
3. No significant canal stenosis.

## 2021-05-06 ENCOUNTER — Ambulatory Visit: Payer: BC Managed Care – PPO | Admitting: Gastroenterology

## 2021-05-15 DIAGNOSIS — G479 Sleep disorder, unspecified: Secondary | ICD-10-CM | POA: Diagnosis not present

## 2021-05-15 DIAGNOSIS — M542 Cervicalgia: Secondary | ICD-10-CM | POA: Diagnosis not present

## 2021-05-15 DIAGNOSIS — R519 Headache, unspecified: Secondary | ICD-10-CM | POA: Diagnosis not present

## 2021-05-15 DIAGNOSIS — R413 Other amnesia: Secondary | ICD-10-CM | POA: Diagnosis not present

## 2021-05-19 ENCOUNTER — Encounter: Payer: Self-pay | Admitting: Nurse Practitioner

## 2021-05-19 ENCOUNTER — Ambulatory Visit (INDEPENDENT_AMBULATORY_CARE_PROVIDER_SITE_OTHER): Payer: BC Managed Care – PPO | Admitting: Nurse Practitioner

## 2021-05-19 ENCOUNTER — Other Ambulatory Visit: Payer: Self-pay

## 2021-05-19 VITALS — BP 110/76 | HR 100 | Temp 98.4°F | Resp 12 | Ht 65.75 in | Wt 168.2 lb

## 2021-05-19 DIAGNOSIS — R310 Gross hematuria: Secondary | ICD-10-CM | POA: Diagnosis not present

## 2021-05-19 DIAGNOSIS — N765 Ulceration of vagina: Secondary | ICD-10-CM | POA: Diagnosis not present

## 2021-05-19 DIAGNOSIS — Z90710 Acquired absence of both cervix and uterus: Secondary | ICD-10-CM

## 2021-05-19 LAB — POCT URINALYSIS DIPSTICK
Bilirubin, UA: NEGATIVE
Blood, UA: NEGATIVE
Glucose, UA: NEGATIVE
Ketones, UA: NEGATIVE
Leukocytes, UA: NEGATIVE
Nitrite, UA: NEGATIVE
Protein, UA: NEGATIVE
Spec Grav, UA: 1.01 (ref 1.010–1.025)
Urobilinogen, UA: 0.2 E.U./dL
pH, UA: 7 (ref 5.0–8.0)

## 2021-05-19 NOTE — Progress Notes (Signed)
Acute Office Visit  Subjective:    Patient ID: Morgan [name], female    DOB: [birth year], 46 y.o.   MRN: [id number]  Chief Complaint  Patient presents with   Blood in the urine    Noticed it on 05/17/21-some blood dripped in the toilet water and had some on the toilet paper. Urinary frequency    HPI Patient is in today for Blood on tissue paper  States she noticed 05/17/2021. Right after she urinates and wipes. No blood in her panties per her report. States that she is not sexually active.  Last intercourse approx 6 months.  Had a hysterectomy approx 13 years ago  Cervix removed approx 2-3 years ago Former smoker has been quit for approx 5 years  States last night 2 drips in the [employer]. She was unsure of where it was coming from   Past Medical History:  Diagnosis Date   Complication of anesthesia    STATES TOLD REINTUBATION WITH HYSTERECTOMY   Difficult intubation    Pt unclear of what difficulty was.  Reports it was with 2010 back surgery and with hemi-thyroidectomy.  No problems in other surgeries.   GERD (gastroesophageal reflux disease)    Headache    MIGRAINES - improved with Ajovy   History of hiatal hernia    Hypertension    PONV (postoperative nausea and vomiting)    Stiff neck    mild limitations of up/down mvmt s/p neck surgery    Past Surgical History:  Procedure Laterality Date   ABDOMINAL HYSTERECTOMY     BACK SURGERY  03/2020   lower back   BALLOON DILATION N/A 04/18/2018   Procedure: BALLOON DILATION;  Surgeon: [name], MD;  Location: [place];  Service: Endoscopy;  Laterality: N/A;   COLONOSCOPY WITH PROPOFOL N/A 04/04/2021   Procedure: COLONOSCOPY WITH BIOPSY;  Surgeon: [name], MD;  Location: [place];  Service: Endoscopy;  Laterality: N/A;  Latex   ESOPHAGOGASTRODUODENOSCOPY (EGD) WITH PROPOFOL N/A 04/18/2018   Procedure: ESOPHAGOGASTRODUODENOSCOPY (EGD) WITH BIOPSIES;  Surgeon: [name], MD;  Location:  [place];  Service: Endoscopy;  Laterality: N/A;   NECK SURGERY     FUSION C5-C7   OOPHORECTOMY     THYROIDECTOMY Left    (Hemi-)   TRACHELECTOMY N/A 10/15/2017   Procedure: TRACHELECTOMY;  Surgeon: Schermerhorn, Gwen Her, MD;  Location: [facility];  Service: Gynecology;  Laterality: N/A;   TUBAL LIGATION      Family History  Problem Relation Age of Onset   Hypertension Mother    Arthritis Mother    Arthritis Father    Stroke Father    Hypertension Father    Heart disease Maternal Uncle    Arthritis Maternal Grandmother    Heart disease Maternal Grandmother    Hypertension Maternal Grandmother    Arthritis Maternal Grandfather    Lung cancer Maternal Grandfather    Heart disease Maternal Grandfather    Arthritis Paternal Grandmother    Heart disease Paternal Grandmother    Hypertension Paternal Grandmother    Arthritis Paternal Grandfather    Heart disease Paternal Grandfather    Stroke Paternal Grandfather     Social History   Socioeconomic History   Marital status: Married    Spouse name: Not on file   Number of children: Not on file   Years of education: Not on file   Highest education level: Not on file  Occupational History   Not on file  Tobacco Use  Smoking status: Former    Types: Cigarettes    Quit date: 09/30/2016    Years since quitting: 4.6   Smokeless tobacco: Never  Vaping Use   Vaping Use: Never used  Substance and Sexual Activity   Alcohol use: Not Currently    Comment: occasional   Drug use: No   Sexual activity: Yes  Other Topics Concern   Not on file  Social History Narrative   Not on file   Social Determinants of Health   Financial Resource Strain: Not on file  Food Insecurity: Not on file  Transportation Needs: Not on file  Physical Activity: Not on file  Stress: Not on file  Social Connections: Not on file  Intimate Partner Violence: Not on file    Outpatient Medications Prior to Visit  Medication Sig Dispense  Refill   AJOVY 225 MG/1.5ML SOAJ Inject into the skin.     atorvastatin (LIPITOR) 10 MG tablet Take 1 tablet (10 mg total) by mouth daily. 90 tablet 3   benazepril-hydrochlorthiazide (LOTENSIN HCT) 10-12.5 MG tablet Take 1 tablet by mouth daily. 90 tablet 3   cyclobenzaprine (FLEXERIL) 10 MG tablet Take 2 tablets (20 mg total) by mouth at bedtime. 180 tablet 1   gabapentin (NEURONTIN) 300 MG capsule Take 1 capsule (300 mg total) by mouth 2 (two) times daily. 180 capsule 1   nortriptyline (PAMELOR) 10 MG capsule Take 10 mg by mouth every morning.     nortriptyline (PAMELOR) 50 MG capsule Take 50 mg by mouth at bedtime.     omeprazole (PRILOSEC) 40 MG capsule TAKE 1 CAPSULE BY MOUTH  TWICE DAILY 180 capsule 3   QUEtiapine (SEROQUEL) 25 MG tablet Take 75 mg by mouth at bedtime.     rizatriptan (MAXALT) 10 MG tablet Take by mouth. 1 tablet by mouth as directed     valACYclovir (VALTREX) 500 MG tablet Take 1 tablet (500 mg total) by mouth daily. 90 tablet 3   Sod Picosulfate-Mag Ox-Cit Acd (CLENPIQ) 10-3.5-12 MG-GM -GM/160ML SOLN Take 320 mLs by mouth as directed. 320 mL 0   venlafaxine (EFFEXOR) 37.5 MG tablet Take 1 tablet by mouth daily. For 1 week and then increase to 2 times daily (Patient not taking: Reported on 04/02/2021)     No facility-administered medications prior to visit.    Allergies  Allergen Reactions   Adhesive [Tape] Other (See Comments)    Bruises skin   Latex Other (See Comments)    Swelling (vaginal)   Lexapro [Escitalopram Oxalate] Other (See Comments)    Loss taste   Metoprolol Other (See Comments)    Lowers heart rate   Topamax [Topiramate]     Loss taste    Review of Systems  Constitutional:  Negative for chills and fever.  Gastrointestinal:  Negative for abdominal pain, constipation, diarrhea, nausea and vomiting.  Genitourinary:  Positive for hematuria. Negative for dysuria, flank pain, frequency, menstrual problem, vaginal bleeding, vaginal discharge and  vaginal pain.      Objective:    Physical Exam Vitals and nursing note reviewed. Exam conducted with a chaperone present [employer], RMA).  Cardiovascular:     Rate and Rhythm: Normal rate and regular rhythm.  Pulmonary:     Effort: Pulmonary effort is normal.     Breath sounds: Normal breath sounds.  Abdominal:     General: Bowel sounds are normal. There is no distension.     Palpations: There is no mass.     Tenderness: There is no  abdominal tenderness. There is no right CVA tenderness or left CVA tenderness.  Genitourinary:    General: Normal vulva.     Exam position: Lithotomy position.       BP 110/76    Pulse 100    Temp 98.4 F (36.9 C)    Resp 12    Ht 5' 5.75" (1.67 m)    Wt 168 lb 4 oz (76.3 kg)    SpO2 98%    BMI 27.36 kg/m  Wt Readings from Last 3 Encounters:  05/19/21 168 lb 4 oz (76.3 kg)  04/04/21 166 lb 12.8 oz (75.7 kg)  03/31/21 170 lb 13.7 oz (77.5 kg)    Health Maintenance Due  Topic Date Due   COVID-19 Vaccine (4 - Booster for Pfizer series) 07/29/2020    There are no preventive care reminders to display for this patient.   Lab Results  Component Value Date   TSH 2.35 05/17/2020   Lab Results  Component Value Date   WBC 6.7 03/31/2021   HGB 12.3 03/31/2021   HCT 39.6 03/31/2021   MCV 84.3 03/31/2021   PLT 326 03/31/2021   Lab Results  Component Value Date   NA 139 03/31/2021   K 3.8 03/31/2021   CO2 29 03/31/2021   GLUCOSE 111 (H) 03/31/2021   BUN 12 03/31/2021   CREATININE 0.77 03/31/2021   BILITOT 0.5 03/31/2021   ALKPHOS 99 03/31/2021   AST 21 03/31/2021   ALT 18 03/31/2021   PROT 7.9 03/31/2021   ALBUMIN 4.4 03/31/2021   CALCIUM 9.2 03/31/2021   ANIONGAP 7 03/31/2021   GFR 74.24 03/19/2021   Lab Results  Component Value Date   CHOL 232 (H) 05/17/2020   Lab Results  Component Value Date   HDL 68 05/17/2020   Lab Results  Component Value Date   LDLCALC 137 (H) 05/17/2020   Lab Results  Component Value  Date   TRIG 144 05/17/2020   Lab Results  Component Value Date   CHOLHDL 3.4 05/17/2020   Lab Results  Component Value Date   HGBA1C 6.4 01/06/2021       Assessment & Plan:   Problem List Items Addressed This Visit       Genitourinary   Gross hematuria - Primary    Was thought to be the issue with blood in toilet on tissue paper UA in office negative for blood.  Patient no longer has menstrual period due to hysterectomy.  We will send off urine for culture pending result      Relevant Orders   POCT urinalysis dipstick (Completed)   Urine Culture   Vaginal ulceration    Not well-visualized "ulcer" in the vaginal vault.  Patient has an extensive history of being screened for cancers having endometriosis and having a total hysterectomy.  Did recommend that she follow-up with GYN for further evaluation.  Exam was limited office due to patient's tolerability of speculated exam.        Other   S/P hysterectomy     No orders of the defined types were placed in this encounter.  This visit occurred during the SARS-CoV-2 public health emergency.  Safety protocols were in place, including screening questions prior to the visit, additional usage of staff PPE, and extensive cleaning of exam room while observing appropriate contact time as indicated for disinfecting solutions.    Romilda Garret, NP

## 2021-05-19 NOTE — Assessment & Plan Note (Signed)
Not well-visualized "ulcer" in the vaginal vault.  Patient has an extensive history of being screened for cancers having endometriosis and having a total hysterectomy.  Did recommend that she follow-up with GYN for further evaluation.  Exam was limited office due to patient's tolerability of speculated exam.

## 2021-05-19 NOTE — Patient Instructions (Signed)
Nice to see you today I do not see that it is coming from your urethra and your urine is clear. Did see a place at the right opening that could be the cause of the blood. I would follow up with your GYN.

## 2021-05-19 NOTE — Assessment & Plan Note (Signed)
Was thought to be the issue with blood in toilet on tissue paper UA in office negative for blood.  Patient no longer has menstrual period due to hysterectomy.  We will send off urine for culture pending result

## 2021-05-20 DIAGNOSIS — N898 Other specified noninflammatory disorders of vagina: Secondary | ICD-10-CM | POA: Diagnosis not present

## 2021-05-20 DIAGNOSIS — N939 Abnormal uterine and vaginal bleeding, unspecified: Secondary | ICD-10-CM | POA: Diagnosis not present

## 2021-05-20 LAB — URINE CULTURE
MICRO NUMBER:: 12937144
Result:: NO GROWTH
SPECIMEN QUALITY:: ADEQUATE

## 2021-05-26 DIAGNOSIS — N952 Postmenopausal atrophic vaginitis: Secondary | ICD-10-CM | POA: Diagnosis not present

## 2021-06-19 ENCOUNTER — Other Ambulatory Visit: Payer: Self-pay | Admitting: Internal Medicine

## 2021-06-19 DIAGNOSIS — M5416 Radiculopathy, lumbar region: Secondary | ICD-10-CM

## 2021-06-19 NOTE — Telephone Encounter (Signed)
Requested Prescriptions  ?Pending Prescriptions Disp Refills  ?? gabapentin (NEURONTIN) 300 MG capsule [Pharmacy Med Name: GABAPENTIN 300MG  CAPSULES] 180 capsule 1  ?  Sig: TAKE 1 CAPSULE(300 MG) BY MOUTH TWICE DAILY  ?  ? Neurology: Anticonvulsants - gabapentin Failed - 06/19/2021  8:24 AM  ?  ?  Failed - Completed PHQ-2 or PHQ-9 in the last 360 days  ?  ?  Failed - Valid encounter within last 12 months  ?  Recent Outpatient Visits   ?None ?  ?  ?Future Appointments   ?        ? In 3 weeks Cable, Alyson Locket, NP [facility] [place], [place]  ?  ? ?  ?  ?  Passed - Cr in normal range and within 360 days  ?  Creat  ?Date Value Ref Range Status  ?05/17/2020 0.93 0.50 - 1.10 mg/dL Final  ? ?Creatinine, Ser  ?Date Value Ref Range Status  ?03/31/2021 0.77 0.44 - 1.00 mg/dL Final  ?   ?  ?  ? ? ?

## 2021-07-10 ENCOUNTER — Ambulatory Visit (INDEPENDENT_AMBULATORY_CARE_PROVIDER_SITE_OTHER): Payer: BC Managed Care – PPO | Admitting: Nurse Practitioner

## 2021-07-10 ENCOUNTER — Encounter: Payer: Self-pay | Admitting: Nurse Practitioner

## 2021-07-10 ENCOUNTER — Other Ambulatory Visit: Payer: Self-pay

## 2021-07-10 VITALS — BP 120/74 | HR 97 | Temp 97.7°F | Resp 12 | Ht 65.0 in | Wt 165.2 lb

## 2021-07-10 DIAGNOSIS — E890 Postprocedural hypothyroidism: Secondary | ICD-10-CM | POA: Diagnosis not present

## 2021-07-10 DIAGNOSIS — Z1231 Encounter for screening mammogram for malignant neoplasm of breast: Secondary | ICD-10-CM

## 2021-07-10 DIAGNOSIS — Z90710 Acquired absence of both cervix and uterus: Secondary | ICD-10-CM

## 2021-07-10 DIAGNOSIS — R7303 Prediabetes: Secondary | ICD-10-CM | POA: Diagnosis not present

## 2021-07-10 DIAGNOSIS — K2100 Gastro-esophageal reflux disease with esophagitis, without bleeding: Secondary | ICD-10-CM

## 2021-07-10 DIAGNOSIS — I10 Essential (primary) hypertension: Secondary | ICD-10-CM | POA: Diagnosis not present

## 2021-07-10 DIAGNOSIS — R519 Headache, unspecified: Secondary | ICD-10-CM

## 2021-07-10 DIAGNOSIS — K130 Diseases of lips: Secondary | ICD-10-CM | POA: Insufficient documentation

## 2021-07-10 DIAGNOSIS — Z0000 Encounter for general adult medical examination without abnormal findings: Secondary | ICD-10-CM | POA: Diagnosis not present

## 2021-07-10 DIAGNOSIS — B001 Herpesviral vesicular dermatitis: Secondary | ICD-10-CM

## 2021-07-10 NOTE — Assessment & Plan Note (Signed)
Patient currently maintained on benazepril-hydrochlorothiazide daily.  Continue taking medication as prescribed blood pressure within normal limits in office today. ?

## 2021-07-10 NOTE — Assessment & Plan Note (Signed)
Discussed age-appropriate immunizations and screening exams.  Encouraged healthy lifestyle modifications ?

## 2021-07-10 NOTE — Assessment & Plan Note (Signed)
Patient is followed by Dr. Gurney Maxin and is currently on Ajovy, nortriptyline, along with Maxalt for abortive therapy.  Continue following with Dr. Melrose Nakayama and taking medication as prescribed. ?

## 2021-07-10 NOTE — Patient Instructions (Addendum)
Nice to see you today ?Try vasoline on the lips if not improving in a week reach out to me ?I placed the order for the mammogram. Please call and schedule it ?I will be in touch with the lab results ?Follow up with me in 6 months, sooner if you need me ? ?The recommendation is that you exercise for 30 mins a day 5 days a week. When first starting out do it 1-2 times a week ?

## 2021-07-10 NOTE — Assessment & Plan Note (Signed)
Patient had very dry cracking skin to the upper lip.  Patient has been using Chapstick without avail did recommend using Vaseline for the next week if no improvement reach out to office.  We will check B12 in case this is a cheilitis. ?

## 2021-07-10 NOTE — Assessment & Plan Note (Signed)
Is followed by GI providers.  Continue omeprazole 40 mg daily. ?

## 2021-07-10 NOTE — Assessment & Plan Note (Signed)
Elevated A1c.  Pending lab results ?

## 2021-07-10 NOTE — Progress Notes (Signed)
? ?Established Patient Office Visit ? ?Subjective:  ?Patient ID: Morgan [name], female    DOB: [birth year]  Age: 46 y.o. MRN: [id number] ? ?CC:  ?Chief Complaint  ?Patient presents with  ? Annual Exam  ?  Has had total hysterectomy  ? ? ?HPI ?Morgan [name] presents for complete physical and follow up of chronic conditions. ? ?Immunizations: ?-Tetanus:05/17/2020 ?-Influenza:01/29/2021 ?-Covid-19:pfizer x2 with one booster ?-Shingles: NA ?-Pneumonia: NA ? ? ? ?Diet: Fair diet. Fast from 6am-4pm. One meal a day. Will have steak salad broccolli. Water. Will drink double shot starbucks through the day ?Exercise: No regular exercise. ? ?Eye exam: saw in 03/2021 does not wear corrective lenses ?Dental exam: Completes semi-annually  ? ?Pap Smear: Hysterectomy, followed by GYN ?Mammogram: Completed in 2020 [facility]. ?Colonoscopy: Completed in 04/04/2021.  With Dr. [name] ?Dexa: NA ?PSA: NA ? ?Lung Cancer Screening: Former smoker does not qualify for low-dose CT scan currently ? ?Sleep: goes to bed aroun 8am and gets up at 5am. States that time change has effected her.  "A cute snore" ? ?Neurology:  Polo Riley ?GI: Dr. [name] ?GYN:  Dr. Marcello Moores schermerhron ? ? ?  07/10/2021  ?  4:18 PM 05/17/2020  ?  2:22 PM 05/16/2019  ?  2:22 PM  ?PHQ9 SCORE ONLY  ?PHQ-9 Total Score 3 0 4  ?  ? ?  07/10/2021  ?  4:18 PM  ?GAD 7 : Generalized Anxiety Score  ?Nervous, Anxious, on Edge 0  ?Control/stop worrying 0  ?Worry too much - different things 0  ?Trouble relaxing 0  ?Restless 0  ?Easily annoyed or irritable 0  ?Afraid - awful might happen 0  ?Total GAD 7 Score 0  ?Anxiety Difficulty Not difficult at all  ? ?  ?Past Medical History:  ?Diagnosis Date  ? Complication of anesthesia   ? STATES TOLD REINTUBATION WITH HYSTERECTOMY  ? Difficult intubation   ? Pt unclear of what difficulty was.  Reports it was with 2010 back surgery and with hemi-thyroidectomy.  No problems in other surgeries.  ? GERD (gastroesophageal reflux  disease)   ? Headache   ? MIGRAINES - improved with Ajovy  ? History of hiatal hernia   ? Hypertension   ? PONV (postoperative nausea and vomiting)   ? Stiff neck   ? mild limitations of up/down mvmt s/p neck surgery  ? ? ?Past Surgical History:  ?Procedure Laterality Date  ? ABDOMINAL HYSTERECTOMY    ? BACK SURGERY  03/2020  ? lower back  ? BALLOON DILATION N/A 04/18/2018  ? Procedure: BALLOON DILATION;  Surgeon: [name], MD;  Location: [facility];  Service: Endoscopy;  Laterality: N/A;  ? COLONOSCOPY WITH PROPOFOL N/A 04/04/2021  ? Procedure: COLONOSCOPY WITH BIOPSY;  Surgeon: [name], MD;  Location: [place];  Service: Endoscopy;  Laterality: N/A;  Latex  ? ESOPHAGOGASTRODUODENOSCOPY (EGD) WITH PROPOFOL N/A 04/18/2018  ? Procedure: ESOPHAGOGASTRODUODENOSCOPY (EGD) WITH BIOPSIES;  Surgeon: [name], MD;  Location: [employer] [facility];  Service: Endoscopy;  Laterality: N/A;  ? NECK SURGERY    ? FUSION C5-C7  ? OOPHORECTOMY    ? THYROIDECTOMY Left   ? (Hemi-)  ? TRACHELECTOMY N/A 10/15/2017  ? Procedure: TRACHELECTOMY;  Surgeon: Schermerhorn, Gwen Her, MD;  Location: [facility];  Service: Gynecology;  Laterality: N/A;  ? TUBAL LIGATION    ? ? ?Family History  ?Problem Relation Age of Onset  ? Hypertension Mother   ? Arthritis Mother   ? Arthritis  Father   ? Stroke Father   ? Hypertension Father   ? Heart disease Maternal Uncle   ? Arthritis Maternal Grandmother   ? Heart disease Maternal Grandmother   ? Hypertension Maternal Grandmother   ? Arthritis Maternal Grandfather   ? Lung cancer Maternal Grandfather   ? Heart disease Maternal Grandfather   ? Arthritis Paternal Grandmother   ? Heart disease Paternal Grandmother   ? Hypertension Paternal Grandmother   ? Arthritis Paternal Grandfather   ? Heart disease Paternal Grandfather   ? Stroke Paternal Grandfather   ? ? ?Social History  ? ?Socioeconomic History  ? Marital status: Married  ?  Spouse name: Not on file  ? Number of children:  Not on file  ? Years of education: Not on file  ? Highest education level: Not on file  ?Occupational History  ? Not on file  ?Tobacco Use  ? Smoking status: Former  ?  Packs/day: 2.00  ?  Years: 25.00  ?  Pack years: 50.00  ?  Types: Cigarettes  ?  Quit date: 09/30/2016  ?  Years since quitting: 4.7  ? Smokeless tobacco: Never  ?Vaping Use  ? Vaping Use: Never used  ?Substance and Sexual Activity  ? Alcohol use: Not Currently  ?  Comment: occasional  ? Drug use: No  ? Sexual activity: Yes  ?Other Topics Concern  ? Not on file  ?Social History Narrative  ? Not on file  ? ?Social Determinants of Health  ? ?Financial Resource Strain: Not on file  ?Food Insecurity: Not on file  ?Transportation Needs: Not on file  ?Physical Activity: Not on file  ?Stress: Not on file  ?Social Connections: Not on file  ?Intimate Partner Violence: Not on file  ? ? ?Outpatient Medications Prior to Visit  ?Medication Sig Dispense Refill  ? AJOVY 225 MG/1.5ML SOAJ Inject into the skin.    ? atorvastatin (LIPITOR) 10 MG tablet Take 1 tablet (10 mg total) by mouth daily. 90 tablet 3  ? benazepril-hydrochlorthiazide (LOTENSIN HCT) 10-12.5 MG tablet Take 1 tablet by mouth daily. 90 tablet 3  ? cyclobenzaprine (FLEXERIL) 10 MG tablet Take 2 tablets (20 mg total) by mouth at bedtime. 180 tablet 1  ? Estradiol 10 MCG TABS vaginal tablet Place vaginally. 2 times a week    ? gabapentin (NEURONTIN) 300 MG capsule Take 1 capsule (300 mg total) by mouth 2 (two) times daily. 180 capsule 1  ? nortriptyline (PAMELOR) 10 MG capsule Take 10 mg by mouth every morning.    ? nortriptyline (PAMELOR) 50 MG capsule Take 50 mg by mouth at bedtime.    ? omeprazole (PRILOSEC) 40 MG capsule TAKE 1 CAPSULE BY MOUTH  TWICE DAILY 180 capsule 3  ? QUEtiapine (SEROQUEL) 25 MG tablet Take 75 mg by mouth at bedtime.    ? rizatriptan (MAXALT) 10 MG tablet Take by mouth. 1 tablet by mouth as directed    ? valACYclovir (VALTREX) 500 MG tablet Take 1 tablet (500 mg total) by  mouth daily. 90 tablet 3  ? ?No facility-administered medications prior to visit.  ? ? ?Allergies  ?Allergen Reactions  ? Adhesive [Tape] Other (See Comments)  ?  Bruises skin  ? Latex Other (See Comments)  ?  Swelling (vaginal)  ? Lexapro [Escitalopram Oxalate] Other (See Comments)  ?  Loss taste  ? Metoprolol Other (See Comments)  ?  Lowers heart rate  ? Topamax [Topiramate]   ?  Loss taste  ? ? ?  ROS ?Review of Systems  ?Constitutional:  Negative for chills, fatigue and fever.  ?Eyes:  Negative for visual disturbance.  ?Respiratory:  Negative for cough and shortness of breath.   ?Cardiovascular:  Negative for chest pain and leg swelling.  ?Gastrointestinal:  Negative for diarrhea, nausea and vomiting.  ?     BM almost every day ?  ?Genitourinary:  Negative for difficulty urinating, dysuria and hematuria.  ?Musculoskeletal:  Positive for back pain and neck pain.  ?Neurological:  Positive for headaches. Negative for dizziness, weakness, light-headedness and numbness.  ?Psychiatric/Behavioral:  Negative for hallucinations and suicidal ideas.   ? ?  ?Objective:  ?  ?Physical Exam ?Vitals and nursing note reviewed.  ?Constitutional:   ?   Appearance: Normal appearance.  ?HENT:  ?   Right Ear: Tympanic membrane, ear canal and external ear normal. There is no impacted cerumen.  ?   Left Ear: Tympanic membrane, ear canal and external ear normal. There is no impacted cerumen.  ?   Mouth/Throat:  ?   Mouth: Mucous membranes are moist.  ?   Pharynx: Oropharynx is clear. No posterior oropharyngeal erythema.  ?Eyes:  ?   Extraocular Movements: Extraocular movements intact.  ?   Pupils: Pupils are equal, round, and reactive to light.  ?Neck:  ?   Thyroid: No thyroid mass or thyromegaly.  ?Cardiovascular:  ?   Rate and Rhythm: Normal rate and regular rhythm.  ?   Pulses: Normal pulses.  ?   Heart sounds: Normal heart sounds.  ?Pulmonary:  ?   Effort: Pulmonary effort is normal.  ?   Breath sounds: Normal breath sounds.   ?Abdominal:  ?   General: Bowel sounds are normal. There is no distension.  ?   Palpations: There is no mass.  ?   Tenderness: There is no abdominal tenderness.  ?   Hernia: No hernia is present.  ?Musculoskelet

## 2021-07-11 LAB — COMPREHENSIVE METABOLIC PANEL
ALT: 16 U/L (ref 0–35)
AST: 20 U/L (ref 0–37)
Albumin: 4.6 g/dL (ref 3.5–5.2)
Alkaline Phosphatase: 93 U/L (ref 39–117)
BUN: 14 mg/dL (ref 6–23)
CO2: 33 mEq/L — ABNORMAL HIGH (ref 19–32)
Calcium: 9.3 mg/dL (ref 8.4–10.5)
Chloride: 100 mEq/L (ref 96–112)
Creatinine, Ser: 0.82 mg/dL (ref 0.40–1.20)
GFR: 87.28 mL/min (ref >60.00)
Glucose, Bld: 90 mg/dL (ref 70–99)
Potassium: 3.4 mEq/L — ABNORMAL LOW (ref 3.5–5.1)
Sodium: 142 mEq/L (ref 135–145)
Total Bilirubin: 0.3 mg/dL (ref 0.2–1.2)
Total Protein: 7.1 g/dL (ref 6.0–8.3)

## 2021-07-11 LAB — CBC WITH DIFFERENTIAL/PLATELET
Basophils Absolute: 0.1 10*3/uL (ref 0.0–0.1)
Basophils Relative: 0.8 % (ref 0.0–3.0)
Eosinophils Absolute: 0.2 10*3/uL (ref 0.0–0.7)
Eosinophils Relative: 2.4 % (ref 0.0–5.0)
HCT: 36 % (ref 36.0–46.0)
Hemoglobin: 11.9 g/dL — ABNORMAL LOW (ref 12.0–15.0)
Lymphocytes Relative: 28.3 % (ref 12.0–46.0)
Lymphs Abs: 2.3 10*3/uL (ref 0.7–4.0)
MCHC: 33 g/dL (ref 30.0–36.0)
MCV: 81.1 fl (ref 78.0–100.0)
Monocytes Absolute: 0.5 10*3/uL (ref 0.1–1.0)
Monocytes Relative: 6.3 % (ref 3.0–12.0)
Neutro Abs: 5 10*3/uL (ref 1.4–7.7)
Neutrophils Relative %: 62.2 % (ref 43.0–77.0)
Platelets: 292 10*3/uL (ref 150.0–400.0)
RBC: 4.44 Mil/uL (ref 3.87–5.11)
RDW: 15.4 % (ref 11.5–15.5)
WBC: 8 10*3/uL (ref 4.0–10.5)

## 2021-07-11 LAB — LIPID PANEL
Cholesterol: 208 mg/dL — ABNORMAL HIGH (ref 0–200)
HDL: 73.4 mg/dL (ref >39.00)
LDL Cholesterol: 115 mg/dL — ABNORMAL HIGH (ref 0–99)
NonHDL: 135.04
Total CHOL/HDL Ratio: 3
Triglycerides: 98 mg/dL (ref 0.0–149.0)
VLDL: 19.6 mg/dL (ref 0.0–40.0)

## 2021-07-11 LAB — TSH: TSH: 1.51 u[IU]/mL (ref 0.35–5.50)

## 2021-07-11 LAB — HEMOGLOBIN A1C: Hgb A1c MFr Bld: 6.5 % (ref 4.6–6.5)

## 2021-07-11 LAB — VITAMIN B12: Vitamin B-12: 233 pg/mL (ref 211–911)

## 2021-07-23 ENCOUNTER — Other Ambulatory Visit: Payer: Self-pay | Admitting: Nurse Practitioner

## 2021-07-23 DIAGNOSIS — M5416 Radiculopathy, lumbar region: Secondary | ICD-10-CM

## 2021-07-29 ENCOUNTER — Other Ambulatory Visit: Payer: Self-pay | Admitting: Nurse Practitioner

## 2021-07-29 DIAGNOSIS — M5416 Radiculopathy, lumbar region: Secondary | ICD-10-CM

## 2021-07-29 DIAGNOSIS — G8929 Other chronic pain: Secondary | ICD-10-CM

## 2021-09-30 DIAGNOSIS — R519 Headache, unspecified: Secondary | ICD-10-CM | POA: Diagnosis not present

## 2021-09-30 DIAGNOSIS — R413 Other amnesia: Secondary | ICD-10-CM | POA: Diagnosis not present

## 2021-09-30 DIAGNOSIS — M542 Cervicalgia: Secondary | ICD-10-CM | POA: Diagnosis not present

## 2021-09-30 DIAGNOSIS — G479 Sleep disorder, unspecified: Secondary | ICD-10-CM | POA: Diagnosis not present

## 2021-10-02 ENCOUNTER — Telehealth: Payer: Self-pay

## 2021-10-02 MED ORDER — OMEPRAZOLE 40 MG PO CPDR: 40.0000 mg | DELAYED_RELEASE_CAPSULE | Freq: Two times a day (BID) | ORAL | 3 refills | Status: DC

## 2021-10-02 NOTE — Telephone Encounter (Signed)
Patient returned phone call. °

## 2021-10-02 NOTE — Telephone Encounter (Signed)
Fax was from [facility] in regards to Omeprazole 40 mg PA expiring soon. I spoke with patient and she has been getting this as a RX and having to do PA each time. Patient also informed me that she is only using [facility] [place] now [facility] her [facility] not [facility]. I spoke with Audria Nine about this and it was ok to re send [facility] to [facility]. [facility] also [facility]ist and they were able to run this RX through with no PA needed at this time and no charge to the patient. Patient advised.

## 2021-10-02 NOTE — Telephone Encounter (Signed)
Received this message from Lenon Curt I received a fax about a prior auth renewal for patient for Omeprazole prescription.  I see where patient had this prescribed in 06/2021 for a full year of medication.  Do I still need to do this PA or just disregard it?   Thanks,   Jae Dire   I left message for patient to call back to discuss this. Has patient been getting this OTC or insurance has been covering it.

## 2021-10-06 ENCOUNTER — Encounter: Payer: Self-pay | Admitting: Nurse Practitioner

## 2021-10-06 ENCOUNTER — Other Ambulatory Visit: Payer: Self-pay | Admitting: Nurse Practitioner

## 2021-10-06 DIAGNOSIS — E782 Mixed hyperlipidemia: Secondary | ICD-10-CM

## 2021-10-06 DIAGNOSIS — B001 Herpesviral vesicular dermatitis: Secondary | ICD-10-CM

## 2021-10-06 DIAGNOSIS — M5416 Radiculopathy, lumbar region: Secondary | ICD-10-CM

## 2021-10-06 DIAGNOSIS — I10 Essential (primary) hypertension: Secondary | ICD-10-CM

## 2021-10-06 DIAGNOSIS — G8929 Other chronic pain: Secondary | ICD-10-CM

## 2021-10-06 DIAGNOSIS — R7303 Prediabetes: Secondary | ICD-10-CM

## 2021-10-06 NOTE — Telephone Encounter (Signed)
RXs pulled in for review.

## 2021-10-07 MED ORDER — CYCLOBENZAPRINE HCL 10 MG PO TABS: 20.0000 mg | ORAL_TABLET | Freq: Every day | ORAL | 1 refills | Status: DC

## 2021-10-07 MED ORDER — BENAZEPRIL-HYDROCHLOROTHIAZIDE 10-12.5 MG PO TABS: 1.0000 | ORAL_TABLET | Freq: Every day | ORAL | 3 refills | Status: DC

## 2021-10-07 MED ORDER — VALACYCLOVIR HCL 500 MG PO TABS: 500.0000 mg | ORAL_TABLET | Freq: Every day | ORAL | 1 refills | Status: DC

## 2021-10-07 MED ORDER — ATORVASTATIN CALCIUM 10 MG PO TABS: 10.0000 mg | ORAL_TABLET | Freq: Every day | ORAL | 3 refills | Status: DC

## 2021-10-07 MED ORDER — GABAPENTIN 300 MG PO CAPS: 300.0000 mg | ORAL_CAPSULE | Freq: Two times a day (BID) | ORAL | 1 refills | Status: DC

## 2021-10-13 ENCOUNTER — Other Ambulatory Visit: Payer: BC Managed Care – PPO

## 2021-10-14 ENCOUNTER — Other Ambulatory Visit (INDEPENDENT_AMBULATORY_CARE_PROVIDER_SITE_OTHER): Payer: BC Managed Care – PPO

## 2021-10-14 DIAGNOSIS — R7303 Prediabetes: Secondary | ICD-10-CM

## 2021-10-15 LAB — HEMOGLOBIN A1C: Hgb A1c MFr Bld: 6.5 % (ref 4.6–6.5)

## 2021-12-12 ENCOUNTER — Telehealth: Payer: Self-pay

## 2021-12-12 NOTE — Telephone Encounter (Signed)
Prior auth started and approved for Omeprazole 40MG dr capsules. [facility] Key: [id number] - PA Case ID: [id number]  Approved today Request Reference Number: [id number]. OMEPRAZOLE CAP 40MG is approved through 12/13/2022. Your patient may now fill this prescription and it will be covered.  Sent note via mychart to patient.  Sent approval letter to scanning.

## 2021-12-27 ENCOUNTER — Other Ambulatory Visit: Payer: Self-pay | Admitting: Nurse Practitioner

## 2021-12-27 DIAGNOSIS — I10 Essential (primary) hypertension: Secondary | ICD-10-CM

## 2021-12-30 ENCOUNTER — Telehealth: Payer: Self-pay

## 2021-12-30 NOTE — Telephone Encounter (Signed)
[place] Day - Client TELEPHONE ADVICE RECORD [facility] Patient Name: Morgan [name] Gender: Female DOB: [birth year] Age: 46 Y 32 M 5 D Return Phone Number: [phone] (Primary) Address: City/ State/ Zip: [place] [address] Client [facility] Day - Client Client Site [place] - Day Contact Type Call Who Is Calling Patient / Member / Family / Caregiver Call Type Triage / Clinical Caller Name Jenny Reichmann Relationship To Patient Other Return Phone Number [phone] (Primary) Chief Complaint CHEST PAIN - pain, pressure, heaviness or tightness Reason for Call Symptomatic / Request for [place] states she has a patient that is experiencing dizziness she is light headed and also having tightness in her chest. Translation No Nurse Assessment Nurse: Raphael Gibney, RN, Vanita Ingles Date/Time ([place] Time): 12/30/2021 12:43:30 PM Confirm and document reason for call. If symptomatic, describe symptoms. ---Caller states she is light headed and dizzy. has tightness in her chest. has had symptoms x 2 weeks. Does the patient have any new or worsening symptoms? ---Yes Will a triage be completed? ---Yes Related visit to physician within the last 2 weeks? ---No Does the PT have any chronic conditions? (i.e. diabetes, asthma, this includes High risk factors for pregnancy, etc.) ---Yes List chronic conditions. ---migraines; HTN Is the patient pregnant or possibly pregnant? (Ask all females between the ages of 64-55) ---No Is this a behavioral health or substance abuse call? ---No Guidelines Guideline Title Affirmed Question Affirmed Notes Nurse Date/Time Eilene Ghazi Time) Chest Pain [1] Chest pain lasts > 5 minutes AND [2] age > 57 Raphael Gibney, RN, Vera 12/30/2021 12:46:10 PM Disp. Time Eilene Ghazi Time) Disposition Final User 12/30/2021 12:41:23 PM Send to Urgent Tereso Newcomer 12/30/2021  12:49:07 PM Call EMS 911 Now Yes Raphael Gibney, RN, Vanita Ingles PLEASE NOTE: All timestamps contained within this report are represented as [place] Standard Time. CONFIDENTIALTY NOTICE: This fax transmission is intended only for the addressee. It contains information that is legally privileged, confidential or otherwise protected from use or disclosure. If you are not the intended recipient, you are strictly prohibited from reviewing, disclosing, copying using or disseminating any of this information or taking any action in reliance on or regarding this information. If you have received this fax in error, please notify us immediately by telephone so that we can arrange for its return to [place]. Phone: [phone], Toll-Free: [phone], Fax: [phone] Page: 2 of 2 Call Id: [id number] [place]. Time Eilene Ghazi Time) Disposition Final User 12/30/2021 12:55:09 PM 911 Outcome Documentation Stringer, RN, Vanita Ingles Reason: Attempted 911 outcome documentation and pt states she can't go to the ER as there is no one to pick up her son from school. Final Disposition 12/30/2021 12:49:07 PM Call EMS 911 Now Yes Raphael Gibney, RN, Doreatha Lew Disagree/Comply Disagree Caller Understands Yes PreDisposition Call Doctor Care Advice Given Per Guideline * Immediate medical attention is needed. You need to hang up and call 911 (or an ambulance). CALL EMS 911 NOW: CARE ADVICE given per Chest Pain (Adult) guideline. Comments User: Dannielle Burn, RN Date/Time Eilene Ghazi Time): 12/30/2021 12:48:54 PM pt does not want to call 911 or go to the ER User: Dannielle Burn, RN Date/Time Eilene Ghazi Time): 12/30/2021 12:52:54 PM Called back line x 3 with no answer to the phone User: Dannielle Burn, RN Date/Time Eilene Ghazi Time): 12/30/2021 12:54:01 PM Called office and spoke to [place] and gave report that pt is dizzy and lightheaded. having chest tightness. Triage outcome call 911 but pt does not want to call  911 or go to the ER. User: Dannielle Burn, RN  Date/Time Eilene Ghazi Time): 12/30/2021 12:55:40 PM chest tightness started today Referrals GO TO FACILITY REFUSE

## 2021-12-30 NOTE — Telephone Encounter (Signed)
Agree with precautions given to pt  Agree with nurse assessment in plan.  Thank you for speaking with them.

## 2021-12-30 NOTE — Telephone Encounter (Signed)
I spoke with pt; pt said this morning pt had sharp stabbing pain at lt shoulder for short period, also on and off had chest tightness this morning. Pt said felt lightheaded and dizzy this morning. At one point pt said she had difficulty in hearing and saw black and blue dots and pt sat down and felt better. Earlier BP 153/102 P 108. I advised pt she should be seen and evaluated. Pt said she is presently sitting in line at school to pick up 44 yr old son. Pt said will take him home and pt promised she would go to [facility] ED for eval. I did advise pt she should not be driving with symptoms she had earlier. Pt voiced understanding but has no one to drive her. Advised pt could call EMS but pt did not want to call 911. Pt said if symptoms returned she would pull over and call 911. Sending note to Romilda Garret NP who is out of office and Eugenia Pancoast FNP who is in office and Anastasiya CMA.

## 2021-12-31 ENCOUNTER — Encounter: Payer: Self-pay | Admitting: *Deleted

## 2021-12-31 ENCOUNTER — Encounter: Payer: Self-pay | Admitting: Family Medicine

## 2021-12-31 ENCOUNTER — Ambulatory Visit (INDEPENDENT_AMBULATORY_CARE_PROVIDER_SITE_OTHER): Payer: BC Managed Care – PPO | Admitting: Family Medicine

## 2021-12-31 VITALS — BP 104/70 | HR 106 | Temp 98.9°F | Ht 65.0 in | Wt 162.2 lb

## 2021-12-31 DIAGNOSIS — R000 Tachycardia, unspecified: Secondary | ICD-10-CM

## 2021-12-31 DIAGNOSIS — E118 Type 2 diabetes mellitus with unspecified complications: Secondary | ICD-10-CM

## 2021-12-31 DIAGNOSIS — I10 Essential (primary) hypertension: Secondary | ICD-10-CM | POA: Diagnosis not present

## 2021-12-31 DIAGNOSIS — R002 Palpitations: Secondary | ICD-10-CM

## 2021-12-31 DIAGNOSIS — E890 Postprocedural hypothyroidism: Secondary | ICD-10-CM | POA: Diagnosis not present

## 2021-12-31 DIAGNOSIS — E782 Mixed hyperlipidemia: Secondary | ICD-10-CM

## 2021-12-31 DIAGNOSIS — R079 Chest pain, unspecified: Secondary | ICD-10-CM | POA: Diagnosis not present

## 2021-12-31 HISTORY — DX: Type 2 diabetes mellitus with unspecified complications: E11.8

## 2021-12-31 LAB — BASIC METABOLIC PANEL
BUN: 16 mg/dL (ref 6–23)
CO2: 29 mEq/L (ref 19–32)
Calcium: 9.3 mg/dL (ref 8.4–10.5)
Chloride: 101 mEq/L (ref 96–112)
Creatinine, Ser: 0.89 mg/dL (ref 0.40–1.20)
GFR: 78.84 mL/min (ref >60.00)
Glucose, Bld: 89 mg/dL (ref 70–99)
Potassium: 3.3 mEq/L — ABNORMAL LOW (ref 3.5–5.1)
Sodium: 139 mEq/L (ref 135–145)

## 2021-12-31 LAB — CBC WITH DIFFERENTIAL/PLATELET
Basophils Absolute: 0.1 10*3/uL (ref 0.0–0.1)
Basophils Relative: 0.7 % (ref 0.0–3.0)
Eosinophils Absolute: 0.1 10*3/uL (ref 0.0–0.7)
Eosinophils Relative: 1.7 % (ref 0.0–5.0)
HCT: 38 % (ref 36.0–46.0)
Hemoglobin: 12.5 g/dL (ref 12.0–15.0)
Lymphocytes Relative: 31.4 % (ref 12.0–46.0)
Lymphs Abs: 2.4 10*3/uL (ref 0.7–4.0)
MCHC: 32.8 g/dL (ref 30.0–36.0)
MCV: 82.1 fl (ref 78.0–100.0)
Monocytes Absolute: 0.6 10*3/uL (ref 0.1–1.0)
Monocytes Relative: 7.3 % (ref 3.0–12.0)
Neutro Abs: 4.5 10*3/uL (ref 1.4–7.7)
Neutrophils Relative %: 58.9 % (ref 43.0–77.0)
Platelets: 264 10*3/uL (ref 150.0–400.0)
RBC: 4.63 Mil/uL (ref 3.87–5.11)
RDW: 15.9 % — ABNORMAL HIGH (ref 11.5–15.5)
WBC: 7.7 10*3/uL (ref 4.0–10.5)

## 2021-12-31 LAB — T3, FREE: T3, Free: 3.2 pg/mL (ref 2.3–4.2)

## 2021-12-31 LAB — HEPATIC FUNCTION PANEL
ALT: 20 U/L (ref 0–35)
AST: 23 U/L (ref 0–37)
Albumin: 4.3 g/dL (ref 3.5–5.2)
Alkaline Phosphatase: 98 U/L (ref 39–117)
Bilirubin, Direct: 0.1 mg/dL (ref 0.0–0.3)
Total Bilirubin: 0.4 mg/dL (ref 0.2–1.2)
Total Protein: 7.6 g/dL (ref 6.0–8.3)

## 2021-12-31 LAB — LIPID PANEL
Cholesterol: 204 mg/dL — ABNORMAL HIGH (ref 0–200)
HDL: 78.3 mg/dL (ref >39.00)
LDL Cholesterol: 115 mg/dL — ABNORMAL HIGH (ref 0–99)
NonHDL: 125.97
Total CHOL/HDL Ratio: 3
Triglycerides: 55 mg/dL (ref 0.0–149.0)
VLDL: 11 mg/dL (ref 0.0–40.0)

## 2021-12-31 LAB — TROPONIN I (HIGH SENSITIVITY): High Sens Troponin I: <2 ng/L (ref 2–17)

## 2021-12-31 LAB — T4, FREE: Free T4: 0.78 ng/dL (ref 0.60–1.60)

## 2021-12-31 LAB — TSH: TSH: 1.69 u[IU]/mL (ref 0.35–5.50)

## 2021-12-31 NOTE — Patient Instructions (Signed)
Cut your blood pressure medication in half right now

## 2021-12-31 NOTE — Progress Notes (Signed)
Desirey Keahey T. Aleida Crandell, MD, [facility] at [facility] [place], [address]  Phone: [phone]  FAX: [phone]  Morgan [name] - 46 y.o. female  MRN [id number]  Date of Birth: 01-Sep-1975  Date: 12/31/2021  PCP: Michela Pitcher, NP  Referral: Michela Pitcher, NP  Chief Complaint  Patient presents with   Dizziness    Going on for about 2 weeks   Chest Pain   Tingling    In left fingers   Subjective:   Morgan [name] is a 46 y.o. very pleasant female patient with Body mass index is 27 kg/m. who presents with the following:  Pleasant young patient with a history of diabetes, hypertension, hyperlipidemia, and a former smoker who presents with some intermittent dizziness with some chest pain off and on for the last 2 to 3 weeks.  She will at times feel lightheaded and feel as if she is going to pass out.  She is also currently tachycardic.  Pulse is 110.  Numbness and tingling in her fingers.  Feels tired all of the time.    Also has had some chest pain going on for 2-3 minutes - has had to sit down today from lightheadedness.  In the left chest.  105/60 in the r arm  BP Readings from Last 3 Encounters:  12/31/21 104/70  07/10/21 120/74  05/19/21 110/76    Orthostatics and EKG. - sinus tach to 105 on EKG  TSH, t3, t4 - she does have a history of prior partial thyroidectomy and active goiter  A1c is 6.5 BS 85 yesterday  Cardiac risk factors DM Lipids HTN Former smoker  Has had some palpitations, too, off and on    Review of Systems is noted in the HPI, as appropriate  Patient Active Problem List   Diagnosis Date Noted   Controlled diabetes mellitus type 2 with complications ([place]) 95/63/8756   H/O partial thyroidectomy 07/10/2021   Cheilitis 07/10/2021   Preventative health care 07/10/2021   Gross hematuria 05/19/2021   S/P hysterectomy 05/19/2021   Vaginal ulceration 05/19/2021   Abnormal CT of the  abdomen    Gastritis 03/19/2021   Constipation 03/19/2021   Mixed hyperlipidemia 05/17/2020   Lumbar radiculopathy 05/17/2020   Frequent headaches 05/17/2020   Recurrent cold sores 05/16/2019   OAB (overactive bladder) 01/17/2018   Chronic neck pain 12/13/2017   HTN (hypertension) 10/29/2016   GERD (gastroesophageal reflux disease) 10/29/2016    Past Medical History:  Diagnosis Date   Complication of anesthesia    STATES TOLD REINTUBATION WITH HYSTERECTOMY   Controlled diabetes mellitus type 2 with complications ([place]) 4/33/2951   Difficult intubation    Pt unclear of what difficulty was.  Reports it was with 2010 back surgery and with hemi-thyroidectomy.  No problems in other surgeries.   GERD (gastroesophageal reflux disease)    Headache    MIGRAINES - improved with Ajovy   History of hiatal hernia    Hypertension    PONV (postoperative nausea and vomiting)    Stiff neck    mild limitations of up/down mvmt s/p neck surgery    Past Surgical History:  Procedure Laterality Date   ABDOMINAL HYSTERECTOMY     BACK SURGERY  03/2020   lower back   BALLOON DILATION N/A 04/18/2018   Procedure: BALLOON DILATION;  Surgeon: [name], MD;  Location: [facility];  Service: Endoscopy;  Laterality: N/A;   COLONOSCOPY WITH PROPOFOL N/A 04/04/2021  Procedure: COLONOSCOPY WITH BIOPSY;  Surgeon: [name], MD;  Location: [facility];  Service: Endoscopy;  Laterality: N/A;  Latex   ESOPHAGOGASTRODUODENOSCOPY (EGD) WITH PROPOFOL N/A 04/18/2018   Procedure: ESOPHAGOGASTRODUODENOSCOPY (EGD) WITH BIOPSIES;  Surgeon: [name], MD;  Location: [place];  Service: Endoscopy;  Laterality: N/A;   NECK SURGERY     FUSION C5-C7   OOPHORECTOMY     THYROIDECTOMY Left    (Hemi-)   TRACHELECTOMY N/A 10/15/2017   Procedure: TRACHELECTOMY;  Surgeon: Schermerhorn, Gwen Her, MD;  Location: [facility];  Service: Gynecology;  Laterality: N/A;   TUBAL LIGATION      Family  History  Problem Relation Age of Onset   Hypertension Mother    Arthritis Mother    Arthritis Father    Stroke Father    Hypertension Father    Heart disease Maternal Uncle    Arthritis Maternal Grandmother    Heart disease Maternal Grandmother    Hypertension Maternal Grandmother    Arthritis Maternal Grandfather    Lung cancer Maternal Grandfather    Heart disease Maternal Grandfather    Arthritis Paternal Grandmother    Heart disease Paternal Grandmother    Hypertension Paternal Grandmother    Arthritis Paternal Grandfather    Heart disease Paternal Grandfather    Stroke Paternal Grandfather     Social History   Social History Narrative   Not on file     Objective:   BP 104/70   Pulse (!) 106   Temp 98.9 F (37.2 C) (Oral)   Ht 5' 5"  (1.651 m)   Wt 162 lb 4 oz (73.6 kg)   SpO2 96%   BMI 27.00 kg/m   GEN: No acute distress; alert,appropriate. PULM: Breathing comfortably in no respiratory distress PSYCH: Normally interactive.  CV: tachycardic to 105-110, no m/g/r PULM: Normal respiratory rate, no accessory muscle use. No wheezes, crackles or rhonchi   Laboratory and Imaging Data: Results for orders placed or performed in visit on 12/31/21  T4, free  Result Value Ref Range   Free T4 0.78 0.60 - 1.60 ng/dL  T3, free  Result Value Ref Range   T3, Free 3.2 2.3 - 4.2 pg/mL  TSH  Result Value Ref Range   TSH 1.69 0.35 - 5.50 uIU/mL  Basic metabolic panel  Result Value Ref Range   Sodium 139 135 - 145 mEq/L   Potassium 3.3 (L) 3.5 - 5.1 mEq/L   Chloride 101 96 - 112 mEq/L   CO2 29 19 - 32 mEq/L   Glucose, Bld 89 70 - 99 mg/dL   BUN 16 6 - 23 mg/dL   Creatinine, Ser 0.89 0.40 - 1.20 mg/dL   GFR 78.84 >60.00 mL/min   Calcium 9.3 8.4 - 10.5 mg/dL  CBC with Differential/Platelet  Result Value Ref Range   WBC 7.7 4.0 - 10.5 K/uL   RBC 4.63 3.87 - 5.11 Mil/uL   Hemoglobin 12.5 12.0 - 15.0 g/dL   HCT 38.0 36.0 - 46.0 %   MCV 82.1 78.0 - 100.0 fl   MCHC  32.8 30.0 - 36.0 g/dL   RDW 15.9 (H) 11.5 - 15.5 %   Platelets 264.0 150.0 - 400.0 K/uL   Neutrophils Relative % 58.9 43.0 - 77.0 %   Lymphocytes Relative 31.4 12.0 - 46.0 %   Monocytes Relative 7.3 3.0 - 12.0 %   Eosinophils Relative 1.7 0.0 - 5.0 %   Basophils Relative 0.7 0.0 - 3.0 %   Neutro Abs 4.5  1.4 - 7.7 K/uL   Lymphs Abs 2.4 0.7 - 4.0 K/uL   Monocytes Absolute 0.6 0.1 - 1.0 K/uL   Eosinophils Absolute 0.1 0.0 - 0.7 K/uL   Basophils Absolute 0.1 0.0 - 0.1 K/uL  Hepatic function panel  Result Value Ref Range   Total Bilirubin 0.4 0.2 - 1.2 mg/dL   Bilirubin, Direct 0.1 0.0 - 0.3 mg/dL   Alkaline Phosphatase 98 39 - 117 U/L   AST 23 0 - 37 U/L   ALT 20 0 - 35 U/L   Total Protein 7.6 6.0 - 8.3 g/dL   Albumin 4.3 3.5 - 5.2 g/dL  Lipid panel  Result Value Ref Range   Cholesterol 204 (H) 0 - 200 mg/dL   Triglycerides 55.0 0.0 - 149.0 mg/dL   HDL 78.30 >39.00 mg/dL   VLDL 11.0 0.0 - 40.0 mg/dL   LDL Cholesterol 115 (H) 0 - 99 mg/dL   Total CHOL/HDL Ratio 3    NonHDL 125.97   Troponin I (High Sensitivity)  Result Value Ref Range   High Sens Troponin I <2 2 - 17 ng/L     Assessment and Plan:     ICD-10-CM   1. Chest pain in adult  Q82.5 Basic metabolic panel    CBC with Differential/Platelet    Hepatic function panel    Troponin I (High Sensitivity)    EKG 12-Lead    Ambulatory referral to Cardiology    2. Controlled type 2 diabetes mellitus with complication, without long-term current use of insulin ([place])  E11.8 Ambulatory referral to Cardiology    3. Primary hypertension  O03 Basic metabolic panel    Ambulatory referral to Cardiology    4. Mixed hyperlipidemia  E78.2 Lipid panel    Ambulatory referral to Cardiology    5. H/O partial thyroidectomy  E89.0 T4, free    T3, free    TSH    Ambulatory referral to Cardiology    6. Tachycardia, unspecified  R00.0 T4, free    T3, free    TSH    Ambulatory referral to Cardiology    7. Palpitations  R00.2  Ambulatory referral to Cardiology     Total encounter time: 40 minutes. This includes total time spent on the day of encounter.  Thankfully, her cardiac enzymes were negative.  EKG: Sinus tachycardia to 105.  Normal axis, normal R wave progression, No acute ST elevation or depression.   In the setting of dizziness, lightheadedness, tachycardia, palpitations, I think she needs further evaluation for possible arrhythmia.  He is setting of chest pain with multiple risk factors including diabetes, high blood pressure, hypertension, hyperlipidemia, as well as a significant prior smoking history, I think it ischemic disease also needs further evaluation.  Consult cardiology.  Very much appreciate their assistance.  The remainder of her other labs have come back and are reassuring.  Medication Management during today's office visit: No orders of the defined types were placed in this encounter.  Medications Discontinued During This Encounter  Medication Reason   Estradiol 10 MCG TABS vaginal tablet Completed Course    Orders placed today for conditions managed today: Orders Placed This Encounter  Procedures   T4, free   T3, free   TSH   Basic metabolic panel   CBC with Differential/Platelet   Hepatic function panel   Lipid panel   Ambulatory referral to Cardiology   EKG 12-Lead    Disposition: No follow-ups on file.  Dragon [employer] One speech-to-text software was used  for transcription in this dictation.  Possible transcriptional errors can occur using Editor, commissioning.   Signed,  Morgan Deed. Floyd Lusignan, MD   Outpatient Encounter Medications as of 12/31/2021  Medication Sig   AJOVY 225 MG/1.5ML SOAJ Inject into the skin.   atorvastatin (LIPITOR) 10 MG tablet Take 1 tablet (10 mg total) by mouth daily.   benazepril-hydrochlorthiazide (LOTENSIN HCT) 10-12.5 MG tablet Take 1 tablet by mouth daily.   cyclobenzaprine (FLEXERIL) 10 MG tablet Take 2 tablets (20 mg total) by mouth at  bedtime.   gabapentin (NEURONTIN) 300 MG capsule Take 1 capsule (300 mg total) by mouth 2 (two) times daily.   nortriptyline (PAMELOR) 10 MG capsule Take 10 mg by mouth every morning.   nortriptyline (PAMELOR) 50 MG capsule Take 50 mg by mouth at bedtime.   omeprazole (PRILOSEC) 40 MG capsule Take 1 capsule (40 mg total) by mouth 2 (two) times daily.   QUEtiapine (SEROQUEL) 25 MG tablet Take 75 mg by mouth at bedtime.   rizatriptan (MAXALT) 10 MG tablet Take by mouth. 1 tablet by mouth as directed   valACYclovir (VALTREX) 500 MG tablet Take 1 tablet (500 mg total) by mouth daily.   [DISCONTINUED] Estradiol 10 MCG TABS vaginal tablet Place vaginally. 2 times a week   No facility-administered encounter medications on file as of 12/31/2021.

## 2022-01-08 ENCOUNTER — Other Ambulatory Visit: Payer: Self-pay | Admitting: Nurse Practitioner

## 2022-01-08 DIAGNOSIS — E782 Mixed hyperlipidemia: Secondary | ICD-10-CM

## 2022-01-14 ENCOUNTER — Ambulatory Visit: Payer: BC Managed Care – PPO | Admitting: Nurse Practitioner

## 2022-01-14 DIAGNOSIS — Z8742 Personal history of other diseases of the female genital tract: Secondary | ICD-10-CM | POA: Diagnosis not present

## 2022-01-14 DIAGNOSIS — Z01419 Encounter for gynecological examination (general) (routine) without abnormal findings: Secondary | ICD-10-CM | POA: Diagnosis not present

## 2022-01-14 DIAGNOSIS — Z87410 Personal history of cervical dysplasia: Secondary | ICD-10-CM | POA: Diagnosis not present

## 2022-01-16 ENCOUNTER — Ambulatory Visit: Payer: BC Managed Care – PPO | Admitting: Nurse Practitioner

## 2022-01-23 ENCOUNTER — Ambulatory Visit (INDEPENDENT_AMBULATORY_CARE_PROVIDER_SITE_OTHER): Payer: BC Managed Care – PPO | Admitting: Nurse Practitioner

## 2022-01-23 VITALS — BP 112/66 | HR 102 | Temp 96.6°F | Resp 12 | Ht 65.0 in | Wt 163.0 lb

## 2022-01-23 DIAGNOSIS — E782 Mixed hyperlipidemia: Secondary | ICD-10-CM | POA: Diagnosis not present

## 2022-01-23 DIAGNOSIS — E118 Type 2 diabetes mellitus with unspecified complications: Secondary | ICD-10-CM

## 2022-01-23 DIAGNOSIS — E876 Hypokalemia: Secondary | ICD-10-CM | POA: Diagnosis not present

## 2022-01-23 DIAGNOSIS — I10 Essential (primary) hypertension: Secondary | ICD-10-CM | POA: Diagnosis not present

## 2022-01-23 DIAGNOSIS — K5900 Constipation, unspecified: Secondary | ICD-10-CM | POA: Diagnosis not present

## 2022-01-23 LAB — POCT GLYCOSYLATED HEMOGLOBIN (HGB A1C): Hemoglobin A1C: 6.1 % — AB (ref 4.0–5.6)

## 2022-01-23 LAB — MICROALBUMIN / CREATININE URINE RATIO
Creatinine,U: 26.2 mg/dL
Microalb Creat Ratio: 2.7 mg/g (ref 0.0–30.0)
Microalb, Ur: <0.7 mg/dL (ref 0.0–1.9)

## 2022-01-23 LAB — POTASSIUM: Potassium: 3.8 mEq/L (ref 3.5–5.1)

## 2022-01-23 MED ORDER — ATORVASTATIN CALCIUM 10 MG PO TABS: 10.0000 mg | ORAL_TABLET | Freq: Every day | ORAL | 3 refills | Status: DC

## 2022-01-23 NOTE — Patient Instructions (Addendum)
Nice to see you today I will be in touch with the labs once I have them  Follow up with me in 6 months for your physical   Try adding in an electrolyte drink once a day.

## 2022-01-23 NOTE — Progress Notes (Signed)
Established Patient Office Visit  Subjective   Patient ID: Morgan [name], female    DOB: May 11, 1975  Age: 46 y.o. MRN: [id number]  Chief Complaint  Patient presents with   Follow-up    6 months    HPI  Dm2: States that she has a glucose meter. No higher than 123. That is checking once-twice a day  Htn: states that she is on benazepril-Hctz daily.  States when she was seen by colleague she was instructed to have her blood pressure medication as this could been causing some of her symptoms.  Patient did and has been having headaches from elevated blood pressure and started to original dosage.  Dizziness:States that shedid cut out her doubleshot starbucks drink. States that she does drink lots of water. States that she tried to drink a ginger ale. States that it has been intermittent since.  Patient does fast throughout the day according to her and her spouse.    Review of Systems  Constitutional:  Negative for chills and fever.  Respiratory:  Negative for shortness of breath.   Cardiovascular:  Positive for palpitations. Negative for chest pain.  Gastrointestinal:  Negative for constipation and diarrhea.  Neurological:  Positive for dizziness. Negative for headaches.  Psychiatric/Behavioral:  Negative for hallucinations and suicidal ideas.       Objective:     BP 112/66   Pulse (!) 102   Temp (!) 96.6 F (35.9 C) (Temporal)   Resp 12   Ht 5' 5"  (1.651 m)   Wt 163 lb (73.9 kg)   SpO2 99%   BMI 27.12 kg/m  BP Readings from Last 3 Encounters:  01/23/22 112/66  12/31/21 104/70  07/10/21 120/74   Wt Readings from Last 3 Encounters:  01/23/22 163 lb (73.9 kg)  12/31/21 162 lb 4 oz (73.6 kg)  07/10/21 165 lb 4 oz (75 kg)      Physical Exam Vitals and nursing note reviewed.  Constitutional:      Appearance: Normal appearance.  Cardiovascular:     Rate and Rhythm: Normal rate and regular rhythm.     Heart sounds: Normal heart sounds.  Pulmonary:     Effort:  Pulmonary effort is normal.     Breath sounds: Normal breath sounds.  Musculoskeletal:     Right lower leg: No edema.     Left lower leg: No edema.  Feet:     Comments: Dry skin to bilateral plantar surfaces of feet Lymphadenopathy:     Cervical: No cervical adenopathy.  Neurological:     Mental Status: She is alert.    Diabetic Foot Form - Detailed   Diabetic Foot Exam - detailed Diabetic Foot exam was performed with the following findings: Yes 01/23/2022 10:03 AM  Is there swelling or and abnormal foot shape?: No Is there a claw toe deformity?: No Is there elevated skin temparature?: No Right posterior Tibialias: Present Left posterior Tibialias: Present   Right Dorsalis Pedis: Present Left Dorsalis Pedis: Present  Semmes-Weinstein Monofilament Test   Comments: Diminished sensation on right at 1,2,5, Absent on 7  Left side all intact       Results for orders placed or performed in visit on 01/23/22  POCT glycosylated hemoglobin (Hb A1C)  Result Value Ref Range   Hemoglobin A1C 6.1 (A) 4.0 - 5.6 %   HbA1c POC (<> result, manual entry)     HbA1c, POC (prediabetic range)     HbA1c, POC (controlled diabetic range)  The 10-year ASCVD risk score (Arnett DK, et al., 2019) is: 0.9%    Assessment & Plan:   Problem List Items Addressed This Visit       Cardiovascular and Mediastinum   HTN (hypertension)    Patient blood pressure well controlled today she is currently maintained on benazepril has been hydrochlorothiazide 10-12.5 mg.  Continue taking medication as prescribed      Relevant Medications   atorvastatin (LIPITOR) 10 MG tablet     Endocrine   Controlled diabetes mellitus type 2 with complications ([place]) - Primary (Chronic)    Patient's previous A1c was 6.5% recheck today 6.1%.  Well-controlled on diet alone.  Updated foot exam and urine microalbumin today.      Relevant Medications   atorvastatin (LIPITOR) 10 MG tablet   Other Relevant Orders    POCT glycosylated hemoglobin (Hb A1C) (Completed)   Microalbumin/Creatinine Ratio, Urine     Other   Mixed hyperlipidemia   Relevant Medications   atorvastatin (LIPITOR) 10 MG tablet   Constipation    Patient did have an episode of going to 10 days without bowel movement took the Linzess is unclear suspect.  She has been using Mira lax 17 g nightly and hot tea that seems to be effective she will go at least once a day if not every other day.      Hypokalemia    Incidental finding on previous blood work done by me and then most recent blood work done by Social worker.  Patient is on HCTZ we will recheck potassium today to make sure she is not continually dropping.      Relevant Orders   Potassium    Return in about 6 months (around 07/25/2022) for CPe and labs.    Romilda Garret, NP

## 2022-01-23 NOTE — Assessment & Plan Note (Signed)
Incidental finding on previous blood work done by me and then most recent blood work done by Social worker.  Patient is on HCTZ we will recheck potassium today to make sure she is not continually dropping.

## 2022-01-23 NOTE — Assessment & Plan Note (Signed)
Patient's previous A1c was 6.5% recheck today 6.1%.  Well-controlled on diet alone.  Updated foot exam and urine microalbumin today.

## 2022-01-23 NOTE — Assessment & Plan Note (Signed)
Patient did have an episode of going to 10 days without bowel movement took the Linzess is unclear suspect.  She has been using Mira lax 17 g nightly and hot tea that seems to be effective she will go at least once a day if not every other day.

## 2022-01-23 NOTE — Assessment & Plan Note (Signed)
Patient blood pressure well controlled today she is currently maintained on benazepril has been hydrochlorothiazide 10-12.5 mg.  Continue taking medication as prescribed

## 2022-02-10 DIAGNOSIS — R519 Headache, unspecified: Secondary | ICD-10-CM | POA: Diagnosis not present

## 2022-02-10 DIAGNOSIS — R413 Other amnesia: Secondary | ICD-10-CM | POA: Diagnosis not present

## 2022-02-10 DIAGNOSIS — M542 Cervicalgia: Secondary | ICD-10-CM | POA: Diagnosis not present

## 2022-02-10 DIAGNOSIS — G479 Sleep disorder, unspecified: Secondary | ICD-10-CM | POA: Diagnosis not present

## 2022-03-09 ENCOUNTER — Encounter: Payer: Self-pay | Admitting: Nurse Practitioner

## 2022-03-09 NOTE — Telephone Encounter (Signed)
Please triage and then we can have Matt review.

## 2022-03-09 NOTE — Telephone Encounter (Signed)
Noted. Will see patient in office

## 2022-03-09 NOTE — Telephone Encounter (Signed)
I spoke with pt; pt said since started with rt sided pain about 1 yr ago about every wk to 2 wks pt has flair up of pain in rt side for 1 - 3 day episodes. Pt said at times the cramping is severe with sharp burning pain that is consistent. This time the cramping so bad the pain has caused pt to vomit. Last time vomited was this morning at 10:30 AM. 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 No diarrhea, no fever, no urinary symptoms, and no blood in BM, vomitus or urine. Pt said has spoken with GI couple of times and testing does not show cause for the pain.Pt cannot come for appt on 03/10/22 and pt scheduled appt with Romilda Garret NP on 03/11/22 at 3 PM with UC & ED precautions.Catalina [place] is aware.sending note to Romilda Garret NP and [facility].

## 2022-03-11 ENCOUNTER — Ambulatory Visit: Payer: BC Managed Care – PPO | Admitting: Nurse Practitioner

## 2022-04-03 ENCOUNTER — Other Ambulatory Visit: Payer: Self-pay | Admitting: Nurse Practitioner

## 2022-04-03 DIAGNOSIS — G8929 Other chronic pain: Secondary | ICD-10-CM

## 2022-04-03 DIAGNOSIS — M5416 Radiculopathy, lumbar region: Secondary | ICD-10-CM

## 2022-04-03 DIAGNOSIS — B001 Herpesviral vesicular dermatitis: Secondary | ICD-10-CM

## 2022-04-15 DIAGNOSIS — G43719 Chronic migraine without aura, intractable, without status migrainosus: Secondary | ICD-10-CM | POA: Diagnosis not present

## 2022-04-20 DIAGNOSIS — K5090 Crohn's disease, unspecified, without complications: Secondary | ICD-10-CM

## 2022-04-20 HISTORY — DX: Crohn's disease, unspecified, without complications: K50.90

## 2022-05-08 ENCOUNTER — Encounter: Payer: Self-pay | Admitting: Nurse Practitioner

## 2022-05-09 ENCOUNTER — Other Ambulatory Visit: Payer: Self-pay | Admitting: Nurse Practitioner

## 2022-05-09 DIAGNOSIS — M5416 Radiculopathy, lumbar region: Secondary | ICD-10-CM

## 2022-06-02 DIAGNOSIS — G43709 Chronic migraine without aura, not intractable, without status migrainosus: Secondary | ICD-10-CM | POA: Diagnosis not present

## 2022-06-07 ENCOUNTER — Other Ambulatory Visit: Payer: Self-pay | Admitting: Nurse Practitioner

## 2022-06-07 DIAGNOSIS — M5416 Radiculopathy, lumbar region: Secondary | ICD-10-CM

## 2022-06-09 ENCOUNTER — Other Ambulatory Visit: Payer: Self-pay

## 2022-06-09 DIAGNOSIS — M5416 Radiculopathy, lumbar region: Secondary | ICD-10-CM

## 2022-06-09 NOTE — Telephone Encounter (Signed)
Changed pharmacy last message requested [facility]

## 2022-06-10 MED ORDER — GABAPENTIN 300 MG PO CAPS: 300.0000 mg | ORAL_CAPSULE | Freq: Two times a day (BID) | ORAL | 1 refills | Status: DC

## 2022-06-15 ENCOUNTER — Encounter: Payer: Self-pay | Admitting: Family Medicine

## 2022-06-15 ENCOUNTER — Ambulatory Visit (INDEPENDENT_AMBULATORY_CARE_PROVIDER_SITE_OTHER): Payer: BC Managed Care – PPO | Admitting: Family Medicine

## 2022-06-15 VITALS — BP 112/74 | HR 96 | Temp 98.2°F | Ht 65.0 in | Wt 163.0 lb

## 2022-06-15 DIAGNOSIS — J320 Chronic maxillary sinusitis: Secondary | ICD-10-CM | POA: Diagnosis not present

## 2022-06-15 DIAGNOSIS — H9202 Otalgia, left ear: Secondary | ICD-10-CM | POA: Diagnosis not present

## 2022-06-15 MED ORDER — AMOXICILLIN-POT CLAVULANATE 875-125 MG PO TABS: 1.0000 | ORAL_TABLET | Freq: Two times a day (BID) | ORAL | 0 refills | Status: DC

## 2022-06-15 NOTE — Progress Notes (Signed)
Morgan [name] T. Morgan Gora, MD, [place] at [facility] [place], [address]  Phone: [phone]  FAX: [phone]  Morgan [name] - 47 y.o. female  MRN [id number]  Date of Birth: March 26, 1976  Date: 06/15/2022  PCP: Michela Pitcher, NP  Referral: Michela Pitcher, NP  Chief Complaint  Patient presents with   Ear Pain    Outer Left Ear x 5 days   Nasal Congestion    Not stopped up but blowing out greenish mucus-Going on for a long time   Subjective:   Morgan [name] is a 47 y.o. very pleasant female patient with Body mass index is 27.12 kg/m. who presents with the following:  Very pleasant young female, she presents with some ongoing sinus pressure and pain, as well as ear pain.  Pain on the L side, and when she tries to press it.  Pain in the ear and pain on the posterior aspect of the ear.  This has been present for 5 days nonstop, but before that it was intermittent and off and on with continued pain in the ear.  Nostrils will hurt in the morning.  Not smelling well for 6 months.  She also does have some sinus pain off and on it has been going on for months. No coughing, ST, other sx.   Blood staying in the frontal sinuses.   No fever, chills, sweats, nausea, vomiting, diarrhea, coughing, rashes, dysuria, or other associated symptoms.  Review of Systems is noted in the HPI, as appropriate  Objective:   BP 112/74   Pulse 96   Temp 98.2 F (36.8 C) (Temporal)   Ht '5\' 5"'$  (1.651 m)   Wt 163 lb (73.9 kg)   SpO2 98%   BMI 27.12 kg/m    Gen: WDWN, NAD. Globally Non-toxic HEENT: Throat clear, w/o exudate, R TM clear, L TM - good landmarks, No fluid present. rhinnorhea.  MMM Frontal sinuses: NT Max sinuses: mild ttp NECK: Anterior cervical  LAD is absent CV: RRR, No M/G/R, cap refill <2 sec PULM: Breathing comfortably in no respiratory distress. no wheezing, crackles, rhonchi   Laboratory and Imaging  Data:  Assessment and Plan:     ICD-10-CM   1. Chronic maxillary sinusitis  J32.0     2. Acute ear pain, left  H92.02      No sign of ear infection.  I think that the ear pain is resultant from some chronic sinusitis it has been brewing for a long time.  I am going to treat this with a round of Augmentin.  Medication Management during today's office visit: Meds ordered this encounter  Medications   amoxicillin-clavulanate (AUGMENTIN) 875-125 MG tablet    Sig: Take 1 tablet by mouth 2 (two) times daily.    Dispense:  20 tablet    Refill:  0   Medications Discontinued During This Encounter  Medication Reason   QUEtiapine (SEROQUEL) 25 MG tablet Dose change    Orders placed today for conditions managed today: No orders of the defined types were placed in this encounter.   Disposition: No follow-ups on file.  [employer] speech-to-text software was used for transcription in this dictation.  Possible transcriptional errors can occur using Editor, commissioning.   Signed,  Maud Deed. Dehaven Sine, MD   Outpatient Encounter Medications as of 06/15/2022  Medication Sig   AJOVY 225 MG/1.5ML SOAJ Inject into the skin.   amoxicillin-clavulanate (AUGMENTIN) 875-125 MG tablet Take 1  tablet by mouth 2 (two) times daily.   atorvastatin (LIPITOR) 10 MG tablet Take 1 tablet (10 mg total) by mouth daily.   benazepril-hydrochlorthiazide (LOTENSIN HCT) 10-12.5 MG tablet Take 1 tablet by mouth daily.   cyclobenzaprine (FLEXERIL) 10 MG tablet TAKE 2 TABLETS BY MOUTH AT BEDTIME.   gabapentin (NEURONTIN) 300 MG capsule Take 1 capsule (300 mg total) by mouth 2 (two) times daily.   nortriptyline (PAMELOR) 10 MG capsule Take 10 mg by mouth every morning.   nortriptyline (PAMELOR) 50 MG capsule Take 50 mg by mouth at bedtime.   omeprazole (PRILOSEC) 40 MG capsule Take 1 capsule (40 mg total) by mouth 2 (two) times daily.   OnabotulinumtoxinA (BOTOX IJ) Inject as directed every 3 (three) months.    QUEtiapine (SEROQUEL) 100 MG tablet Take 100 mg by mouth at bedtime.   valACYclovir (VALTREX) 500 MG tablet TAKE 1 TABLET (500 MG TOTAL) BY MOUTH DAILY.   rizatriptan (MAXALT) 10 MG tablet Take by mouth. 1 tablet by mouth as directed   [DISCONTINUED] QUEtiapine (SEROQUEL) 25 MG tablet Take 75 mg by mouth at bedtime.   No facility-administered encounter medications on file as of 06/15/2022.

## 2022-06-22 ENCOUNTER — Ambulatory Visit: Payer: BC Managed Care – PPO | Admitting: Nurse Practitioner

## 2022-06-25 ENCOUNTER — Telehealth: Payer: Self-pay | Admitting: Gastroenterology

## 2022-06-25 NOTE — Telephone Encounter (Signed)
Per pt is in a lot of pain. She thinks she has inflammation wants/ need to be seen before May.

## 2022-06-30 NOTE — Telephone Encounter (Signed)
Pt r/s to August 03, 2021

## 2022-07-14 DIAGNOSIS — G43719 Chronic migraine without aura, intractable, without status migrainosus: Secondary | ICD-10-CM | POA: Diagnosis not present

## 2022-08-04 ENCOUNTER — Ambulatory Visit: Payer: BC Managed Care – PPO | Admitting: Gastroenterology

## 2022-08-13 ENCOUNTER — Ambulatory Visit (INDEPENDENT_AMBULATORY_CARE_PROVIDER_SITE_OTHER): Payer: BC Managed Care – PPO | Admitting: Family Medicine

## 2022-08-13 ENCOUNTER — Encounter: Payer: Self-pay | Admitting: Family Medicine

## 2022-08-13 VITALS — BP 92/70 | HR 106 | Temp 98.3°F | Ht 65.0 in | Wt 165.1 lb

## 2022-08-13 DIAGNOSIS — R1031 Right lower quadrant pain: Secondary | ICD-10-CM

## 2022-08-13 MED ORDER — DICYCLOMINE HCL 10 MG PO CAPS: 10.0000 mg | ORAL_CAPSULE | Freq: Three times a day (TID) | ORAL | 0 refills | Status: DC

## 2022-08-13 NOTE — Patient Instructions (Signed)
Please stop at the lab to have labs drawn.  Can try bentyl for possible IBS pain as needed  If pain not improving cal for consideration of CT scan. Consider follow up with Dr. Servando Snare.  Go to ER if severe pain.

## 2022-08-13 NOTE — Progress Notes (Signed)
Patient ID: Morgan [name], female    DOB: 06-13-1975, 47 y.o.   MRN: [id number]  This visit was conducted in person.  BP 92/70   Pulse (!) 106   Temp 98.3 F (36.8 C) (Temporal)   Ht  (1.651 m)   Wt 165 lb 2 oz (74.9 kg)   SpO2 98%   BMI 27.48 kg/m    CC:  Chief Complaint  Patient presents with   RLQ Pain    Originally seen at ER on 03/31/2021-See CT results Pain continues to get worse     Subjective:   HPI: Morgan [name] is a 47 y.o. female patient of Matt cable, NP with history of GERD, hypertension,  and recurrent abdominal pain presenting on 08/13/2022 for RLQ Pain (Originally seen at ER on 03/31/2021-See CT results/Pain continues to get worse/)   ER visit for right lower quadrant pain 03/2021 treated with Carafate, Cipro, metronidazole, Zofran and Bentyl CT abdomen pelvis with contrast March 31, 2021: IMPRESSION: 1. Approximately 5 cm segment of circumferentially thickened terminal ileum with adjacent fat stranding, suggestive of an infectious or inflammatory enteritis including inflammatory bowel disease such as Crohn's disease. 2. Mild right-sided hydroureteronephrosis is favored reactive as the right ureter closely approximates the inflamed terminal ileum. Mild urothelial enhancement along the mid right ureter at this location. No renal or ureteral stone. 3. Moderate hiatal hernia. 4. Moderate colonic stool burden.  Reviewed colonoscopy from April 04, 2021 Dr. Servando Snare.  Ileum biopsied and unremarkable, hemorrhoids  Today she reports recurrent right lower quadrant abdominal pain since 03/2021 she has had flares of RLQ abdominal 1-2 times a month.  Pain is constant for 1-5 days.  More frequent in last month.. 3 episodes.    No blood  in stool.. uses miralax for constipation... has BMs daily  IBS-C   She h as decreased  caffeine, nuts. Has cut out OTC meds.  Moderate diet.  No urinary symptoms.  Has history of vaginal bleeding but none  currently.  BTL Hysterectomy (supracervical) d/t endometriosis 2010 Discectomy anterior cervical w/ decomp 2012 [place] 2019 Lap assisted cervical trachelectomy 2019 Seen for vaginal spotting / granulated tissue 11/2017    Relevant past medical, surgical, family and social history reviewed and updated as indicated. Interim medical history since our last visit reviewed. Allergies and medications reviewed and updated. Outpatient Medications Prior to Visit  Medication Sig Dispense Refill   AJOVY 225 MG/1.5ML SOAJ Inject into the skin.     atorvastatin (LIPITOR) 10 MG tablet Take 1 tablet (10 mg total) by mouth daily. 90 tablet 3   benazepril-hydrochlorthiazide (LOTENSIN HCT) 10-12.5 MG tablet Take 1 tablet by mouth daily. 90 tablet 3   cyclobenzaprine (FLEXERIL) 10 MG tablet TAKE 2 TABLETS BY MOUTH AT BEDTIME. 180 tablet 1   gabapentin (NEURONTIN) 400 MG capsule Take 400 mg by mouth. Take 400 mg in the morning and 800 mg at night     nortriptyline (PAMELOR) 10 MG capsule Take 10 mg by mouth every morning.     nortriptyline (PAMELOR) 50 MG capsule Take 50 mg by mouth at bedtime.     omeprazole (PRILOSEC) 40 MG capsule Take 1 capsule (40 mg total) by mouth 2 (two) times daily. 180 capsule 3   OnabotulinumtoxinA (BOTOX IJ) Inject as directed every 3 (three) months.     QUEtiapine (SEROQUEL) 100 MG tablet Take 100 mg by mouth at bedtime.     valACYclovir (VALTREX) 500 MG tablet TAKE 1 TABLET (500 MG TOTAL) BY  MOUTH DAILY. 90 tablet 1   rizatriptan (MAXALT) 10 MG tablet Take by mouth. 1 tablet by mouth as directed     amoxicillin-clavulanate (AUGMENTIN) 875-125 MG tablet Take 1 tablet by mouth 2 (two) times daily. 20 tablet 0   gabapentin (NEURONTIN) 300 MG capsule Take 1 capsule (300 mg total) by mouth 2 (two) times daily. 180 capsule 1   No facility-administered medications prior to visit.     Per HPI unless specifically indicated in ROS section below Review of Systems  Constitutional:   Negative for fatigue and fever.  HENT:  Negative for congestion.   Eyes:  Negative for pain.  Respiratory:  Negative for cough and shortness of breath.   Cardiovascular:  Negative for chest pain, palpitations and leg swelling.  Gastrointestinal:  Negative for abdominal pain.  Genitourinary:  Negative for dysuria and vaginal bleeding.  Musculoskeletal:  Negative for back pain.  Neurological:  Negative for syncope, light-headedness and headaches.  Psychiatric/Behavioral:  Negative for dysphoric mood.    Objective:  BP 92/70   Pulse (!) 106   Temp 98.3 F (36.8 C) (Temporal)   Ht  (1.651 m)   Wt 165 lb 2 oz (74.9 kg)   SpO2 98%   BMI 27.48 kg/m   Wt Readings from Last 3 Encounters:  08/13/22 165 lb 2 oz (74.9 kg)  06/15/22 163 lb (73.9 kg)  01/23/22 163 lb (73.9 kg)      Physical Exam Constitutional:      General: She is not in acute distress.    Appearance: Normal appearance. She is well-developed. She is not ill-appearing or toxic-appearing.  HENT:     Head: Normocephalic.     Right Ear: Hearing, tympanic membrane, ear canal and external ear normal. Tympanic membrane is not erythematous, retracted or bulging.     Left Ear: Hearing, tympanic membrane, ear canal and external ear normal. Tympanic membrane is not erythematous, retracted or bulging.     Nose: No mucosal edema or rhinorrhea.     Right Sinus: No maxillary sinus tenderness or frontal sinus tenderness.     Left Sinus: No maxillary sinus tenderness or frontal sinus tenderness.     Mouth/Throat:     Pharynx: Uvula midline.  Eyes:     General: Lids are normal. Lids are everted, no foreign bodies appreciated.     Conjunctiva/sclera: Conjunctivae normal.     Pupils: Pupils are equal, round, and reactive to light.  Neck:     Thyroid: No thyroid mass or thyromegaly.     Vascular: No carotid bruit.     Trachea: Trachea normal.  Cardiovascular:     Rate and Rhythm: Normal rate and regular rhythm.     Pulses:  Normal pulses.     Heart sounds: Normal heart sounds, S1 normal and S2 normal. No murmur heard.    No friction rub. No gallop.  Pulmonary:     Effort: Pulmonary effort is normal. No tachypnea or respiratory distress.     Breath sounds: Normal breath sounds. No decreased breath sounds, wheezing, rhonchi or rales.  Abdominal:     General: Bowel sounds are normal.     Palpations: Abdomen is soft.     Tenderness: There is abdominal tenderness in the right lower quadrant. There is no right CVA tenderness, left CVA tenderness, guarding or rebound.  Musculoskeletal:     Cervical back: Normal range of motion and neck supple.  Skin:    General: Skin is warm and dry.  Findings: No rash.  Neurological:     Mental Status: She is alert.  Psychiatric:        Mood and Affect: Mood is not anxious or depressed.        Speech: Speech normal.        Behavior: Behavior normal. Behavior is cooperative.        Thought Content: Thought content normal.        Judgment: Judgment normal.       Results for orders placed or performed in visit on 01/23/22  Microalbumin/Creatinine Ratio, Urine  Result Value Ref Range   Microalb, Ur <0.7 0.0 - 1.9 mg/dL   Creatinine,U 16.1 mg/dL   Microalb Creat Ratio 2.7 0.0 - 30.0 mg/g  Potassium  Result Value Ref Range   Potassium 3.8 3.5 - 5.1 mEq/L  POCT glycosylated hemoglobin (Hb A1C)  Result Value Ref Range   Hemoglobin A1C 6.1 (A) 4.0 - 5.6 %   HbA1c POC (<> result, manual entry)     HbA1c, POC (prediabetic range)     HbA1c, POC (controlled diabetic range)      Assessment and Plan  RLQ abdominal pain Assessment & Plan: Chronic, intermittent but increasing in frequency Ongoing since 2022 when CT scan showed terminal ileitis treated with Cipro Flagyl. Follow-up colonoscopy with Dr. Daleen Squibb was within normal limits on biopsy.  Symptoms today consistent with possible irritable bowel syndrome versus constipation (this is unlikely given she says regular bowel  movements with MiraLAX) versus ileitis. Will evaluate with CBC to check for signs of inflammation/infection, blood loss.  Will check complete metabolic panel and lipase for potential atypical liver or pancreas issue. Discussed possible repeat CT scan given currently in a flare, but we will begin with lab work. ?  Functional pain... She denies any relationship to increased stress or anxiety. She will try a course of Bentyl 3 times daily before every meal and nightly for irritable bowel spasms. Return and ER precautions provided.   Orders: -     CBC with Differential/Platelet -     Comprehensive metabolic panel -     Lipase  Other orders -     Dicyclomine HCl; Take 1 capsule (10 mg total) by mouth 4 (four) times daily -  before meals and at bedtime.  Dispense: 30 capsule; Refill: 0    No follow-ups on file.   Kerby Nora, MD

## 2022-08-13 NOTE — Assessment & Plan Note (Signed)
Chronic, intermittent but increasing in frequency Ongoing since 2022 when CT scan showed terminal ileitis treated with Cipro Flagyl. Follow-up colonoscopy with Dr. Daleen Squibb was within normal limits on biopsy.  Symptoms today consistent with possible irritable bowel syndrome versus constipation (this is unlikely given she says regular bowel movements with MiraLAX) versus ileitis. Will evaluate with CBC to check for signs of inflammation/infection, blood loss.  Will check complete metabolic panel and lipase for potential atypical liver or pancreas issue. Discussed possible repeat CT scan given currently in a flare, but we will begin with lab work. ?  Functional pain... She denies any relationship to increased stress or anxiety. She will try a course of Bentyl 3 times daily before every meal and nightly for irritable bowel spasms. Return and ER precautions provided.

## 2022-08-14 LAB — CBC WITH DIFFERENTIAL/PLATELET
Basophils Absolute: 0.1 10*3/uL (ref 0.0–0.1)
Basophils Relative: 0.9 % (ref 0.0–3.0)
Eosinophils Absolute: 0.1 10*3/uL (ref 0.0–0.7)
Eosinophils Relative: 2 % (ref 0.0–5.0)
HCT: 40 % (ref 36.0–46.0)
Hemoglobin: 13.1 g/dL (ref 12.0–15.0)
Lymphocytes Relative: 32.1 % (ref 12.0–46.0)
Lymphs Abs: 2.3 10*3/uL (ref 0.7–4.0)
MCHC: 32.7 g/dL (ref 30.0–36.0)
MCV: 81.8 fl (ref 78.0–100.0)
Monocytes Absolute: 0.5 10*3/uL (ref 0.1–1.0)
Monocytes Relative: 6.6 % (ref 3.0–12.0)
Neutro Abs: 4.2 10*3/uL (ref 1.4–7.7)
Neutrophils Relative %: 58.4 % (ref 43.0–77.0)
Platelets: 305 10*3/uL (ref 150.0–400.0)
RBC: 4.89 Mil/uL (ref 3.87–5.11)
RDW: 15.7 % — ABNORMAL HIGH (ref 11.5–15.5)
WBC: 7.2 10*3/uL (ref 4.0–10.5)

## 2022-08-14 LAB — COMPREHENSIVE METABOLIC PANEL
ALT: 13 U/L (ref 0–35)
AST: 18 U/L (ref 0–37)
Albumin: 4.6 g/dL (ref 3.5–5.2)
Alkaline Phosphatase: 100 U/L (ref 39–117)
BUN: 11 mg/dL (ref 6–23)
CO2: 29 mEq/L (ref 19–32)
Calcium: 9.4 mg/dL (ref 8.4–10.5)
Chloride: 100 mEq/L (ref 96–112)
Creatinine, Ser: 0.87 mg/dL (ref 0.40–1.20)
GFR: 80.67 mL/min (ref >60.00)
Glucose, Bld: 92 mg/dL (ref 70–99)
Potassium: 3.4 mEq/L — ABNORMAL LOW (ref 3.5–5.1)
Sodium: 139 mEq/L (ref 135–145)
Total Bilirubin: 0.4 mg/dL (ref 0.2–1.2)
Total Protein: 7.8 g/dL (ref 6.0–8.3)

## 2022-08-14 LAB — LIPASE: Lipase: 11 U/L (ref 11.0–59.0)

## 2022-08-25 ENCOUNTER — Encounter: Payer: Self-pay | Admitting: Family Medicine

## 2022-09-17 ENCOUNTER — Ambulatory Visit: Payer: BC Managed Care – PPO | Admitting: Gastroenterology

## 2022-10-06 ENCOUNTER — Other Ambulatory Visit: Payer: Self-pay | Admitting: Nurse Practitioner

## 2022-10-06 DIAGNOSIS — M5416 Radiculopathy, lumbar region: Secondary | ICD-10-CM

## 2022-10-06 DIAGNOSIS — G8929 Other chronic pain: Secondary | ICD-10-CM

## 2022-10-06 DIAGNOSIS — B001 Herpesviral vesicular dermatitis: Secondary | ICD-10-CM

## 2022-10-08 ENCOUNTER — Ambulatory Visit (INDEPENDENT_AMBULATORY_CARE_PROVIDER_SITE_OTHER): Payer: BC Managed Care – PPO | Admitting: Family

## 2022-10-08 VITALS — BP 90/60 | HR 102 | Temp 98.7°F | Ht 65.0 in | Wt 157.0 lb

## 2022-10-08 DIAGNOSIS — R1084 Generalized abdominal pain: Secondary | ICD-10-CM | POA: Diagnosis not present

## 2022-10-08 DIAGNOSIS — I10 Essential (primary) hypertension: Secondary | ICD-10-CM

## 2022-10-08 MED ORDER — BENAZEPRIL HCL 10 MG PO TABS
10.0000 mg | ORAL_TABLET | Freq: Every day | ORAL | 3 refills | Status: DC
Start: 2022-10-08 — End: 2023-09-23

## 2022-10-08 NOTE — Assessment & Plan Note (Addendum)
Low, even prior visits. Stop hydrochlorothiazide  Start benazepril 10 mg only once daily  Suspect low also as dehydrated, however in April also low.  F/u with PCP with med change

## 2022-10-08 NOTE — Progress Notes (Signed)
Established Patient Office Visit  Subjective:   Patient ID: Morgan [name], female    DOB: 1975/06/14  Age: 47 y.o. MRN: [id number]  CC:  Chief Complaint  Patient presents with   Diarrhea    nausea and vomiting x 6 days. Fever off and on.      HPI: Morgan [name] is a 47 y.o. female presenting on 10/08/2022 for Diarrhea (nausea and vomiting x 6 days. Fever off and on. /)  Six days ago started with nausea vomiting and diarrhea. Can not seem to keep anything down. Also with fever. Diarrhea with 10 + episodes at a time.  Day before with soft stools then became watery. Has not had a bowel moement since yesterday. Only taking phenergan.   Taking phenergan but still throwing up.  Zofran not helping either.  Fever up to 102 last night.  Lower abdominal cramping on and off.  Decreased urine output. But also taking a lot of fluids.    Wt Readings from Last 3 Encounters:  10/08/22 157 lb (71.2 kg)  08/13/22 165 lb 2 oz (74.9 kg)  06/15/22 163 lb (73.9 kg)   Temp Readings from Last 3 Encounters:  10/08/22 98.7 F (37.1 C) (Temporal)  08/13/22 98.3 F (36.8 C) (Temporal)  06/15/22 98.2 F (36.8 C) (Temporal)   BP Readings from Last 3 Encounters:  10/08/22 90/60  08/13/22 92/70  06/15/22 112/74   Pulse Readings from Last 3 Encounters:  10/08/22 (!) 102  08/13/22 (!) 106  06/15/22 96         ROS: Negative unless specifically indicated above in HPI.   Relevant past medical history reviewed and updated as indicated.   Allergies and medications reviewed and updated.   Current Outpatient Medications:    benazepril (LOTENSIN) 10 MG tablet, Take 1 tablet (10 mg total) by mouth daily., Disp: 90 tablet, Rfl: 3   AJOVY 225 MG/1.5ML SOAJ, Inject into the skin., Disp: , Rfl:    atorvastatin (LIPITOR) 10 MG tablet, Take 1 tablet (10 mg total) by mouth daily., Disp: 90 tablet, Rfl: 3   cyclobenzaprine (FLEXERIL) 10 MG tablet, TAKE 2 TABLETS BY MOUTH AT BEDTIME, Disp: 180 tablet,  Rfl: 1   dicyclomine (BENTYL) 10 MG capsule, Take 1 capsule (10 mg total) by mouth 4 (four) times daily -  before meals and at bedtime., Disp: 30 capsule, Rfl: 0   gabapentin (NEURONTIN) 400 MG capsule, Take 400 mg by mouth. Take 400 mg in the morning and 800 mg at night, Disp: , Rfl:    nortriptyline (PAMELOR) 10 MG capsule, Take 10 mg by mouth every morning., Disp: , Rfl:    nortriptyline (PAMELOR) 50 MG capsule, Take 50 mg by mouth at bedtime., Disp: , Rfl:    omeprazole (PRILOSEC) 40 MG capsule, Take 1 capsule (40 mg total) by mouth 2 (two) times daily., Disp: 180 capsule, Rfl: 3   OnabotulinumtoxinA (BOTOX IJ), Inject as directed every 3 (three) months., Disp: , Rfl:    QUEtiapine (SEROQUEL) 100 MG tablet, Take 100 mg by mouth at bedtime., Disp: , Rfl:    rizatriptan (MAXALT) 10 MG tablet, Take by mouth. 1 tablet by mouth as directed, Disp: , Rfl:    valACYclovir (VALTREX) 500 MG tablet, TAKE 1 TABLET (500 MG TOTAL) BY MOUTH DAILY., Disp: 90 tablet, Rfl: 1  Allergies  Allergen Reactions   Adhesive [Tape] Other (See Comments)    Bruises skin   Latex Other (See Comments)    Swelling (vaginal)   Lexapro [  Escitalopram Oxalate] Other (See Comments)    Loss taste   Metoprolol Other (See Comments)    Lowers heart rate   Topamax [Topiramate]     Loss taste    Objective:   BP 90/60   Pulse (!) 102   Temp 98.7 F (37.1 C) (Temporal)   Ht 5\' 5"  (1.651 m)   Wt 157 lb (71.2 kg)   SpO2 98%   BMI 26.13 kg/m    Physical Exam Constitutional:      General: She is not in acute distress.    Appearance: Normal appearance. She is normal weight. She is not ill-appearing, toxic-appearing or diaphoretic.  HENT:     Head: Normocephalic.  Cardiovascular:     Rate and Rhythm: Normal rate and regular rhythm.  Pulmonary:     Effort: Pulmonary effort is normal.     Breath sounds: Normal breath sounds.  Abdominal:     General: Bowel sounds are increased.     Palpations: Abdomen is soft.      Tenderness: There is generalized abdominal tenderness. There is guarding. Positive signs include Rovsing's sign, McBurney's sign and obturator sign.     Hernia: No hernia is present.     Comments: Moderate to severe pain all quadrants abdomen   Musculoskeletal:        General: Normal range of motion.  Neurological:     General: No focal deficit present.     Mental Status: She is alert and oriented to person, place, and time. Mental status is at baseline.  Psychiatric:        Mood and Affect: Mood normal.        Behavior: Behavior normal.        Thought Content: Thought content normal.        Judgment: Judgment normal.     Assessment & Plan:  Primary hypertension Assessment & Plan: Low, even prior visits. Stop hydrochlorothiazide  Start benazepril 10 mg only once daily  Suspect low also as dehydrated, however in April also low.  F/u with PCP with med change  Orders: -     Benazepril HCl; Take 1 tablet (10 mg total) by mouth daily.  Dispense: 90 tablet; Refill: 3  Generalized abdominal pain Assessment & Plan: Moderate to severe With fever, concern for appendicitis and or other bowel etiology Ddx appendicitis, cholecystitic, pancreatitis, IBD and or diverticulitis  Pt advised to go to ER as likely dehydrated as well requiring possible fluids.  Pt stable upon leaving.       Follow up plan: No follow-ups on file.  Mort Sawyers, FNP

## 2022-10-08 NOTE — Assessment & Plan Note (Addendum)
Moderate to severe With fever, concern for appendicitis and or other bowel etiology Ddx appendicitis, cholecystitic, pancreatitis, IBD and or diverticulitis  Pt advised to go to ER as likely dehydrated as well requiring possible fluids.  Pt stable upon leaving.

## 2022-10-30 ENCOUNTER — Other Ambulatory Visit: Payer: Self-pay | Admitting: Nurse Practitioner

## 2022-10-30 DIAGNOSIS — M5416 Radiculopathy, lumbar region: Secondary | ICD-10-CM

## 2022-11-24 ENCOUNTER — Other Ambulatory Visit: Payer: Self-pay | Admitting: Nurse Practitioner

## 2022-11-24 DIAGNOSIS — M5416 Radiculopathy, lumbar region: Secondary | ICD-10-CM

## 2022-12-06 ENCOUNTER — Other Ambulatory Visit: Payer: Self-pay | Admitting: Nurse Practitioner

## 2022-12-08 ENCOUNTER — Ambulatory Visit (INDEPENDENT_AMBULATORY_CARE_PROVIDER_SITE_OTHER): Payer: BC Managed Care – PPO | Admitting: Gastroenterology

## 2022-12-08 ENCOUNTER — Encounter: Payer: Self-pay | Admitting: Gastroenterology

## 2022-12-08 VITALS — BP 139/88 | HR 92 | Temp 98.2°F | Wt 158.0 lb

## 2022-12-08 DIAGNOSIS — R935 Abnormal findings on diagnostic imaging of other abdominal regions, including retroperitoneum: Secondary | ICD-10-CM

## 2022-12-08 DIAGNOSIS — R109 Unspecified abdominal pain: Secondary | ICD-10-CM | POA: Diagnosis not present

## 2022-12-08 MED ORDER — DICYCLOMINE HCL 10 MG PO CAPS: 10.0000 mg | ORAL_CAPSULE | Freq: Three times a day (TID) | ORAL | 2 refills | Status: DC

## 2022-12-08 NOTE — Progress Notes (Signed)
Gastroenterology Consultation  Referring Provider:     Eden Emms, NP Primary Care Physician:  Eden Emms, NP Primary Gastroenterologist:  Dr. Servando Snare     Reason for Consultation:     Right side abdominal pain        HPI:   Morgan [name] is a 47 y.o. y/o female referred for consultation & management of right side abdominal pain by Dr. Toney Reil, Genene Churn, NP.  This patient comes to see me after having a colonoscopy in 2020 with abnormality in the terminal ileum and suspicion for possible Crohn's.  The patient has a history of IBS with constipation.  The patient was then for an abnormal CT scan that showed thickening of the terminal ileum.  The patient underwent a colonoscopy with biopsies of the terminal ileum that did not show any inflammation or signs of inflammatory bowel disease.  The patient states that she has right lower quadrant pain that can last a few days if she does not take her 10 mg of dicyclomine tablets quickly.  If she gets it before the pain gets significant the pain will subside.  The patient denies any diarrhea black stools or bloody stools.  She takes MiraLAX for her IBS with constipation.  There is no report of any unexplained weight loss.  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  Reports it was with 2010 back surgery and with hemi-thyroidectomy.  No problems in other surgeries.   GERD (gastroesophageal reflux disease)    Headache    MIGRAINES - improved with Ajovy   History of hiatal hernia    Hypertension    PONV (postoperative nausea and vomiting)    Stiff neck    mild limitations of up/down mvmt s/p neck surgery    Past Surgical History:  Procedure Laterality Date   ABDOMINAL HYSTERECTOMY     BACK SURGERY  03/2020   lower back   BALLOON DILATION N/A  04/18/2018   Procedure: BALLOON DILATION;  Surgeon: [name], MD;  Location: [facility];  Service: Endoscopy;  Laterality: N/A;   COLONOSCOPY WITH PROPOFOL N/A 04/04/2021   Procedure: COLONOSCOPY WITH BIOPSY;  Surgeon: [name], MD;  Location: [facility];  Service: Endoscopy;  Laterality: N/A;  Latex   ESOPHAGOGASTRODUODENOSCOPY (EGD) WITH PROPOFOL N/A 04/18/2018   Procedure: ESOPHAGOGASTRODUODENOSCOPY (EGD) WITH BIOPSIES;  Surgeon: [name], MD;  Location: [facility];  Service: Endoscopy;  Laterality: N/A;   NECK SURGERY     FUSION C5-C7   OOPHORECTOMY     THYROIDECTOMY Left    (Hemi-)   TRACHELECTOMY N/A 10/15/2017   Procedure: TRACHELECTOMY;  Surgeon: Schermerhorn, Ihor Austin, MD;  Location: [facility];  Service: Gynecology;  Laterality: N/A;   TUBAL LIGATION      Prior to Admission medications   Medication Sig Start Date End Date Taking? Authorizing Provider  AJOVY 225 MG/1.5ML SOAJ Inject into the skin. 03/10/21  Yes [provider]  atorvastatin (LIPITOR) 10 MG tablet Take 1 tablet (10 mg total) by mouth daily. 01/23/22  Yes Eden Emms, NP  benazepril (LOTENSIN) 10 MG tablet Take 1 tablet (10 mg total) by mouth daily. 10/08/22  Yes Dugal, Wyatt Mage, FNP  cyclobenzaprine (FLEXERIL) 10 MG tablet TAKE 2 TABLETS BY MOUTH AT BEDTIME 10/07/22  Yes  Eden Emms, NP  dicyclomine (BENTYL) 10 MG capsule Take 1 capsule (10 mg total) by mouth 4 (four) times daily -  before meals and at bedtime. 08/13/22  Yes Bedsole, Amy E, MD  gabapentin (NEURONTIN) 400 MG capsule Take 400 mg by mouth. Take 400 mg in the morning and 800 mg at night 08/13/22  Yes [provider]  nortriptyline (PAMELOR) 10 MG capsule Take 10 mg by mouth every morning.   Yes [provider]  nortriptyline (PAMELOR) 50 MG capsule Take 50 mg by mouth at bedtime.   Yes [provider]  omeprazole (PRILOSEC) 40 MG capsule TAKE 1 CAPSULE BY MOUTH TWICE A DAY 12/07/22   Yes Eden Emms, NP  OnabotulinumtoxinA (BOTOX IJ) Inject as directed every 3 (three) months.   Yes [provider]  QUEtiapine (SEROQUEL) 100 MG tablet Take 100 mg by mouth at bedtime.   Yes [provider]  valACYclovir (VALTREX) 500 MG tablet TAKE 1 TABLET (500 MG TOTAL) BY MOUTH DAILY. 10/07/22  Yes Eden Emms, NP  rizatriptan (MAXALT) 10 MG tablet Take by mouth. 1 tablet by mouth as directed 01/02/21 01/02/22  [provider]    Family History  Problem Relation Age of Onset   Hypertension Mother    Arthritis Mother    Arthritis Father    Stroke Father    Hypertension Father    Heart disease Maternal Uncle    Arthritis Maternal Grandmother    Heart disease Maternal Grandmother    Hypertension Maternal Grandmother    Arthritis Maternal Grandfather    Lung cancer Maternal Grandfather    Heart disease Maternal Grandfather    Arthritis Paternal Grandmother    Heart disease Paternal Grandmother    Hypertension Paternal Grandmother    Arthritis Paternal Grandfather    Heart disease Paternal Grandfather    Stroke Paternal Grandfather      Social History   Tobacco Use   Smoking status: Former    Current packs/day: 0.00    Average packs/day: 2.0 packs/day for 25.0 years (50.0 ttl pk-yrs)    Types: Cigarettes    Start date: 10/01/1991    Quit date: 09/30/2016    Years since quitting: 6.1   Smokeless tobacco: Never  Vaping Use   Vaping status: Never Used  Substance Use Topics   Alcohol use: Not Currently    Comment: occasional   Drug use: No    Allergies as of 12/08/2022 - Review Complete 12/08/2022  Allergen Reaction Noted   Adhesive [tape] Other (See Comments) 09/24/2017   Latex Other (See Comments) 09/24/2017   Lexapro [escitalopram oxalate] Other (See Comments) 10/15/2016   Metoprolol Other (See Comments) 10/29/2016   Topamax [topiramate]  10/15/2016    Review of Systems:    All systems reviewed and negative except where noted in  HPI.   Physical Exam:  BP 139/88 (BP Location: Left Arm, Patient Position: Sitting, Cuff Size: Normal)   Pulse 92   Temp 98.2 F (36.8 C) (Oral)   Wt 158 lb (71.7 kg)   BMI 26.29 kg/m  No LMP recorded. Patient has had a hysterectomy. General:   Alert,  Well-developed, well-nourished, pleasant and cooperative in NAD Head:  Normocephalic and atraumatic. Eyes:  Sclera clear, no icterus.   Conjunctiva pink. Ears:  Normal auditory acuity. Neck:  Supple; no masses or thyromegaly. Lungs:  Respirations even and unlabored.  Clear throughout to auscultation.   No wheezes, crackles, or rhonchi. No acute distress. Heart:  Regular  rate and rhythm; no murmurs, clicks, rubs, or gallops. Abdomen:  Normal bowel sounds.  No bruits.  Soft, non-tender and non-distended without masses, hepatosplenomegaly or hernias noted.  No guarding or rebound tenderness.  Negative Carnett sign.   Rectal:  Deferred.  Pulses:  Normal pulses noted. Extremities:  No clubbing or edema.  No cyanosis. Neurologic:  Alert and oriented x3;  grossly normal neurologically. Skin:  Intact without significant lesions or rashes.  No jaundice. Lymph Nodes:  No significant cervical adenopathy. Psych:  Alert and cooperative. Normal mood and affect.  Imaging Studies: No results found.  Assessment and Plan:   Majesta Severn is a 47 y.o. y/o female who comes in today with a history of an abnormal CT scan a few years ago with a suspicion for chronic disease and a colonoscopy that did not show any signs of Crohn disease.  The patient now has intermittent right sided pain that happens once every 2 weeks and usually last for a few days.  She is helped greatly with dicyclomine when she takes it.  The patient had a colonoscopy with biopsies that were not consistent with any sign of inflammation.  The patient will be started on the dicyclomine daily instead of as she is taking it as needed now.  The patient will also have her stools checked for  fecal calprotectin to further evaluate for possible inflammatory bowel disease.  The patient has been explained the plan agrees with it.    [name], MD. Clementeen Graham    Note: This dictation was prepared with Dragon dictation along with smaller phrase technology. Any transcriptional errors that result from this process are unintentional.

## 2022-12-09 DIAGNOSIS — R935 Abnormal findings on diagnostic imaging of other abdominal regions, including retroperitoneum: Secondary | ICD-10-CM | POA: Diagnosis not present

## 2022-12-12 LAB — CALPROTECTIN, FECAL: Calprotectin, Fecal: 311 ug/g — ABNORMAL HIGH (ref 0–120)

## 2022-12-15 ENCOUNTER — Telehealth: Payer: Self-pay

## 2022-12-15 DIAGNOSIS — R935 Abnormal findings on diagnostic imaging of other abdominal regions, including retroperitoneum: Secondary | ICD-10-CM

## 2022-12-15 DIAGNOSIS — R1031 Right lower quadrant pain: Secondary | ICD-10-CM

## 2022-12-15 NOTE — Addendum Note (Signed)
Addended by: Roena Malady on: 12/15/2022 05:21 PM   Modules accepted: Orders

## 2022-12-15 NOTE — Telephone Encounter (Signed)
Pt called to report that she has not had any improvement with Sx since starting the dicyclomine and also wanted to know what is next considering her lab came back as abnormal.... Please advise

## 2022-12-16 NOTE — Telephone Encounter (Signed)
Morgan Minium, Morgan [name]  Morgan [name] (2:50 PM)    Please let the patient know that other things can cause the increased fecal calprotectin but I wanted to tested to make sure that it was not elevated.  NSAIDs can cause this elevation also.  Due to the positive results she should be set up for a MR enterography to rule out any small bowel inflammation such as Crohn's somewhere else besides where we biopsied.

## 2022-12-16 NOTE — Telephone Encounter (Signed)
Pt is aware... MRI in clinical review... progress notes faxed via [email or web address]

## 2022-12-22 NOTE — Telephone Encounter (Signed)
I spoke to the insurance and they stated that the pt would need a more recent work up, such as additional labs (CMP,CBC...), [place] abd complete etc before considering the approval of an MRI...   Please advise

## 2022-12-23 NOTE — Addendum Note (Signed)
Addended by: Roena Malady on: 12/23/2022 01:02 PM   Modules accepted: Orders

## 2022-12-24 ENCOUNTER — Ambulatory Visit: Payer: BC Managed Care – PPO

## 2022-12-24 ENCOUNTER — Ambulatory Visit: Admission: RE | Admit: 2022-12-24 | Payer: BC Managed Care – PPO | Source: Ambulatory Visit

## 2022-12-24 NOTE — Telephone Encounter (Signed)
Pt is aware of scheduled [place] and labs ordered..the patient will get labs next week and [place] scheduled for 9/11

## 2022-12-28 ENCOUNTER — Other Ambulatory Visit: Payer: Self-pay

## 2022-12-28 DIAGNOSIS — R1031 Right lower quadrant pain: Secondary | ICD-10-CM

## 2022-12-29 ENCOUNTER — Ambulatory Visit: Payer: BC Managed Care – PPO | Admitting: Gastroenterology

## 2022-12-29 LAB — CBC WITH DIFFERENTIAL/PLATELET
Basophils Absolute: 0 10*3/uL (ref 0.0–0.2)
Basos: 1 %
EOS (ABSOLUTE): 0.1 10*3/uL (ref 0.0–0.4)
Eos: 2 %
Hematocrit: 40 % (ref 34.0–46.6)
Hemoglobin: 12.8 g/dL (ref 11.1–15.9)
Immature Grans (Abs): 0 10*3/uL (ref 0.0–0.1)
Immature Granulocytes: 0 %
Lymphocytes Absolute: 1.9 10*3/uL (ref 0.7–3.1)
Lymphs: 25 %
MCH: 26.9 pg (ref 26.6–33.0)
MCHC: 32 g/dL (ref 31.5–35.7)
MCV: 84 fL (ref 79–97)
Monocytes Absolute: 0.5 10*3/uL (ref 0.1–0.9)
Monocytes: 6 %
Neutrophils Absolute: 5.1 10*3/uL (ref 1.4–7.0)
Neutrophils: 66 %
Platelets: 324 10*3/uL (ref 150–450)
RBC: 4.76 x10E6/uL (ref 3.77–5.28)
RDW: 14.1 % (ref 11.7–15.4)
WBC: 7.7 10*3/uL (ref 3.4–10.8)

## 2022-12-29 LAB — COMPREHENSIVE METABOLIC PANEL
ALT: 13 IU/L (ref 0–32)
AST: 16 IU/L (ref 0–40)
Albumin: 4.6 g/dL (ref 3.9–4.9)
Alkaline Phosphatase: 123 IU/L — ABNORMAL HIGH (ref 44–121)
BUN/Creatinine Ratio: 14 (ref 9–23)
BUN: 11 mg/dL (ref 6–24)
Bilirubin Total: <0.2 mg/dL (ref 0.0–1.2)
CO2: 27 mmol/L (ref 20–29)
Calcium: 9.7 mg/dL (ref 8.7–10.2)
Chloride: 103 mmol/L (ref 96–106)
Creatinine, Ser: 0.78 mg/dL (ref 0.57–1.00)
Globulin, Total: 2.5 g/dL (ref 1.5–4.5)
Glucose: 90 mg/dL (ref 70–99)
Potassium: 4.3 mmol/L (ref 3.5–5.2)
Sodium: 143 mmol/L (ref 134–144)
Total Protein: 7.1 g/dL (ref 6.0–8.5)
eGFR: 95 mL/min/{1.73_m2} (ref >59)

## 2022-12-29 LAB — URINALYSIS, ROUTINE W REFLEX MICROSCOPIC
Bilirubin, UA: NEGATIVE
Glucose, UA: NEGATIVE
Ketones, UA: NEGATIVE
Leukocytes,UA: NEGATIVE
Nitrite, UA: NEGATIVE
Protein,UA: NEGATIVE
RBC, UA: NEGATIVE
Specific Gravity, UA: 1.005 (ref 1.005–1.030)
Urobilinogen, Ur: 0.2 mg/dL (ref 0.2–1.0)
pH, UA: 7 (ref 5.0–7.5)

## 2022-12-30 ENCOUNTER — Ambulatory Visit
Admission: RE | Admit: 2022-12-30 | Discharge: 2022-12-30 | Disposition: A | Discharge status: Home / Self Care | Payer: BC Managed Care – PPO | Source: Ambulatory Visit | Attending: Gastroenterology | Admitting: Gastroenterology

## 2022-12-30 DIAGNOSIS — N1330 Unspecified hydronephrosis: Secondary | ICD-10-CM | POA: Diagnosis not present

## 2022-12-30 DIAGNOSIS — R109 Unspecified abdominal pain: Secondary | ICD-10-CM | POA: Diagnosis not present

## 2022-12-30 DIAGNOSIS — R1031 Right lower quadrant pain: Secondary | ICD-10-CM | POA: Insufficient documentation

## 2023-01-01 ENCOUNTER — Encounter: Payer: Self-pay | Admitting: Gastroenterology

## 2023-01-01 DIAGNOSIS — K449 Diaphragmatic hernia without obstruction or gangrene: Secondary | ICD-10-CM

## 2023-01-07 ENCOUNTER — Other Ambulatory Visit: Payer: Self-pay | Admitting: Family

## 2023-01-07 DIAGNOSIS — E782 Mixed hyperlipidemia: Secondary | ICD-10-CM

## 2023-01-07 NOTE — Telephone Encounter (Signed)
Can we get the patient in per my last office note within the next 30 days

## 2023-01-07 NOTE — Telephone Encounter (Signed)
Patient has been scheduled

## 2023-01-14 ENCOUNTER — Ambulatory Visit
Admission: RE | Admit: 2023-01-14 | Discharge: 2023-01-14 | Disposition: A | Discharge status: Home / Self Care | Payer: BC Managed Care – PPO | Source: Ambulatory Visit | Attending: Gastroenterology | Admitting: Gastroenterology

## 2023-01-14 DIAGNOSIS — R935 Abnormal findings on diagnostic imaging of other abdominal regions, including retroperitoneum: Secondary | ICD-10-CM

## 2023-01-14 DIAGNOSIS — R1031 Right lower quadrant pain: Secondary | ICD-10-CM | POA: Diagnosis not present

## 2023-01-14 DIAGNOSIS — K449 Diaphragmatic hernia without obstruction or gangrene: Secondary | ICD-10-CM | POA: Diagnosis not present

## 2023-01-14 DIAGNOSIS — K5090 Crohn's disease, unspecified, without complications: Secondary | ICD-10-CM | POA: Diagnosis not present

## 2023-01-14 DIAGNOSIS — N1330 Unspecified hydronephrosis: Secondary | ICD-10-CM | POA: Diagnosis not present

## 2023-01-14 MED ORDER — BARIUM SULFATE 0.1 % PO SUSP
450.0000 mL | Freq: Once | ORAL | Status: AC
  Administered 2023-01-14: 1350 mL via ORAL

## 2023-01-14 MED ORDER — GADOBUTROL 1 MMOL/ML IV SOLN
7.0000 mL | Freq: Once | INTRAVENOUS | Status: AC | PRN
  Administered 2023-01-14: 7 mL via INTRAVENOUS

## 2023-01-18 DIAGNOSIS — Z01411 Encounter for gynecological examination (general) (routine) with abnormal findings: Secondary | ICD-10-CM | POA: Diagnosis not present

## 2023-01-18 DIAGNOSIS — Z124 Encounter for screening for malignant neoplasm of cervix: Secondary | ICD-10-CM | POA: Diagnosis not present

## 2023-01-18 DIAGNOSIS — Z23 Encounter for immunization: Secondary | ICD-10-CM | POA: Diagnosis not present

## 2023-01-18 DIAGNOSIS — N760 Acute vaginitis: Secondary | ICD-10-CM | POA: Diagnosis not present

## 2023-01-18 DIAGNOSIS — R87615 Unsatisfactory cytologic smear of cervix: Secondary | ICD-10-CM | POA: Diagnosis not present

## 2023-01-18 DIAGNOSIS — Z1331 Encounter for screening for depression: Secondary | ICD-10-CM | POA: Diagnosis not present

## 2023-01-21 NOTE — Addendum Note (Signed)
Addended by: Roena Malady on: 01/21/2023 11:15 AM   Modules accepted: Orders

## 2023-01-22 ENCOUNTER — Other Ambulatory Visit: Payer: Self-pay | Admitting: Nurse Practitioner

## 2023-01-22 DIAGNOSIS — E782 Mixed hyperlipidemia: Secondary | ICD-10-CM

## 2023-01-27 ENCOUNTER — Encounter: Payer: Self-pay | Admitting: Nurse Practitioner

## 2023-01-27 ENCOUNTER — Ambulatory Visit: Payer: BC Managed Care – PPO | Admitting: Nurse Practitioner

## 2023-01-27 VITALS — BP 124/88 | HR 103 | Temp 98.7°F | Ht 65.0 in | Wt 166.0 lb

## 2023-01-27 DIAGNOSIS — M5416 Radiculopathy, lumbar region: Secondary | ICD-10-CM

## 2023-01-27 DIAGNOSIS — E118 Type 2 diabetes mellitus with unspecified complications: Secondary | ICD-10-CM

## 2023-01-27 DIAGNOSIS — E782 Mixed hyperlipidemia: Secondary | ICD-10-CM | POA: Diagnosis not present

## 2023-01-27 DIAGNOSIS — I10 Essential (primary) hypertension: Secondary | ICD-10-CM | POA: Diagnosis not present

## 2023-01-27 DIAGNOSIS — Z0000 Encounter for general adult medical examination without abnormal findings: Secondary | ICD-10-CM

## 2023-01-27 DIAGNOSIS — Z87891 Personal history of nicotine dependence: Secondary | ICD-10-CM | POA: Diagnosis not present

## 2023-01-27 DIAGNOSIS — K219 Gastro-esophageal reflux disease without esophagitis: Secondary | ICD-10-CM

## 2023-01-27 LAB — POCT GLYCOSYLATED HEMOGLOBIN (HGB A1C): Hemoglobin A1C: 5.9 % — AB (ref 4.0–5.6)

## 2023-01-27 NOTE — Assessment & Plan Note (Signed)
Controlled with diet alone.  A1c 5.9% today.  Continue lifestyle modifications

## 2023-01-27 NOTE — Assessment & Plan Note (Signed)
Pending urine microscopy to rule out microscopic hematuria

## 2023-01-27 NOTE — Progress Notes (Signed)
Established Patient Office Visit  Subjective   Patient ID: Morgan [name], female    DOB: 12-Mar-1976  Age: 47 y.o. MRN: [id number]  Chief Complaint  Patient presents with   Annual Exam    HPI   for complete physical and follow up of chronic conditions. HTN: Patient currently maintained on benazepril. States that she does check it regularly. Doing well   GERD: Patient currently maintained on omeprazole 80 mg daily  DM2: Patient is been controlled with diet alone does not check sugars at home.  Migraines: Patient currently following neurology, Dr. Theora Master patient currently on Maxalt, nortriptyline, Seroquel and Ajovy.  Patient states they would not pay for [facility] and she did not like the Botox so she discontinued therapy   Immunizations: -Tetanus: Completed in 2022 -Influenza: 01/18/2023 -Shingles: Too young -Pneumonia: Too young -COVID: Original 2 and booster  Diet: Fair diet. 1 melas a day. States that she is doing the drinkable greek yogurt x2. Water  Exercise: No regular exercise.  Eye exam: PRN  Dental exam: needs updating  Colonoscopy: Completed in 2022 patient recently diagnosed with Crohn's disease and has been referred to Crohn's specialist. Lung Cancer Screening: Does not qualify  Pap smear: Followed by GYN hysterectomy  Mammogram: Due, through GYN.  Patient states they have ordered this  Sleep: states that she is going to bed at 8 and get up at 520 in the morning. Feels rested. Does not snore       Review of Systems  Constitutional:  Negative for chills and fever.  Respiratory:  Negative for shortness of breath.   Cardiovascular:  Negative for chest pain and leg swelling.  Gastrointestinal:  Positive for abdominal pain and constipation (uses miralax). Negative for blood in stool, diarrhea, nausea and vomiting.       BM daily   Genitourinary:  Negative for dysuria and hematuria.  Neurological:  Positive for headaches. Negative for tingling.   Psychiatric/Behavioral:  Negative for hallucinations and suicidal ideas.       Objective:     BP 124/88   Pulse (!) 103   Temp 98.7 F (37.1 C) (Oral)   Ht 5\' 5"  (1.651 m)   Wt 166 lb (75.3 kg)   SpO2 99%   BMI 27.62 kg/m  BP Readings from Last 3 Encounters:  01/27/23 124/88  12/08/22 139/88  10/08/22 90/60   Wt Readings from Last 3 Encounters:  01/27/23 166 lb (75.3 kg)  12/08/22 158 lb (71.7 kg)  10/08/22 157 lb (71.2 kg)   SpO2 Readings from Last 3 Encounters:  01/27/23 99%  10/08/22 98%  08/13/22 98%      Physical Exam Vitals and nursing note reviewed.  Constitutional:      Appearance: Normal appearance.  HENT:     Right Ear: Tympanic membrane, ear canal and external ear normal.     Left Ear: Tympanic membrane, ear canal and external ear normal.     Mouth/Throat:     Mouth: Mucous membranes are moist.     Pharynx: Oropharynx is clear.  Eyes:     Extraocular Movements: Extraocular movements intact.     Pupils: Pupils are equal, round, and reactive to light.  Cardiovascular:     Rate and Rhythm: Normal rate and regular rhythm.     Pulses: Normal pulses.     Heart sounds: Normal heart sounds.  Pulmonary:     Effort: Pulmonary effort is normal.     Breath sounds: Normal breath sounds.  Abdominal:  General: Bowel sounds are normal. There is no distension.     Palpations: There is no mass.     Tenderness: There is no abdominal tenderness.     Hernia: No hernia is present.  Musculoskeletal:     Right lower leg: No edema.     Left lower leg: No edema.  Lymphadenopathy:     Cervical: No cervical adenopathy.  Skin:    General: Skin is warm.  Neurological:     General: No focal deficit present.     Mental Status: She is alert.     Deep Tendon Reflexes:     Reflex Scores:      Bicep reflexes are 2+ on the right side and 2+ on the left side.      Patellar reflexes are 1+ on the right side and 2+ on the left side.    Comments: Bilateral upper and  lower extremity strength 5/5  Psychiatric:        Mood and Affect: Mood normal.        Behavior: Behavior normal.        Thought Content: Thought content normal.        Judgment: Judgment normal.    Title   Diabetic Foot Exam - detailed Is there a history of foot ulcer?: No Is there a foot ulcer now?: No Is there swelling?: No Is there elevated skin temperature?: No Is there abnormal foot shape?: No Is there a claw toe deformity?: No Are the toenails long?: No Are the toenails thick?: No Are the toenails ingrown?: No Pulse Foot Exam completed.: Yes   Right Posterior Tibialis: Present Left posterior Tibialis: Present   Right Dorsalis Pedis: Present Left Dorsalis Pedis: Present     Sensory Foot Exam Completed.: Yes Semmes-Weinstein Monofilament Test "+" means "has sensation" and "-" means "no sensation"      Image components are not supported.   Image components are not supported. Image components are not supported.  Tuning Fork Comments All 10 sites sensation intact bilaterally   Callus to bilateral great toes medially       Results for orders placed or performed in visit on 01/27/23  POCT glycosylated hemoglobin (Hb A1C)  Result Value Ref Range   Hemoglobin A1C 5.9 (A) 4.0 - 5.6 %   HbA1c POC (<> result, manual entry)     HbA1c, POC (prediabetic range)     HbA1c, POC (controlled diabetic range)        The 10-year ASCVD risk score (Arnett DK, et al., 2019) is: 1.2%    Assessment & Plan:   Problem List Items Addressed This Visit       Cardiovascular and Mediastinum   HTN (hypertension)    Patient currently maintained on benazepril 10 mg daily.  Does check blood pressure at home.  Tolerates medication well.  Blood pressure control.  Continue medication as prescribed        Digestive   GERD (gastroesophageal reflux disease)    Followed by GI on omeprazole 40 mg twice daily.  Continue        Endocrine   Controlled diabetes mellitus type 2 with  complications (HCC) (Chronic)    Controlled with diet alone.  A1c 5.9% today.  Continue lifestyle modifications      Relevant Orders   POCT glycosylated hemoglobin (Hb A1C) (Completed)   Microalbumin / creatinine urine ratio     Nervous and Auditory   Lumbar radiculopathy    Patient currently maintained on cyclobenzaprine and gabapentin.  Continue medication as prescribed        Other   Mixed hyperlipidemia    History of the same.  Patient currently maintained on atorvastatin 10 mg.  Pending lipid panel      Relevant Orders   Lipid panel   Preventative health care - Primary    Discussed age-appropriate immunizations and screening exams.  Reviewed patient's personal, surgical, social, family histories.  Patient is up-to-date on all age-appropriate vaccinations she would like.  Patient is up-to-date on CRC screening.  Has done her cervical cancer screening this year.  Breast cancer screening mammograms been ordered through GYN.  Patient was given information at discharge about preventative healthcare maintenance with anticipatory guidance.      Relevant Orders   TSH   Former smoker    Pending urine microscopy to rule out microscopic hematuria.      Relevant Orders   Urine Microscopic    Return in about 6 months (around 07/28/2023) for DM recheck.    Audria Nine, NP

## 2023-01-27 NOTE — Assessment & Plan Note (Signed)
Followed by GI on omeprazole 40 mg twice daily.  Continue

## 2023-01-27 NOTE — Assessment & Plan Note (Signed)
History of the same.  Patient currently maintained on atorvastatin 10 mg.  Pending lipid panel

## 2023-01-27 NOTE — Patient Instructions (Signed)
Nice to see you today I will be in touch with the labs once I have reviewed them  Follow up with me in 6 months for an A1C recheck, sooner if you need me

## 2023-01-27 NOTE — Assessment & Plan Note (Signed)
Patient currently maintained on cyclobenzaprine and gabapentin.  Continue medication as prescribed

## 2023-01-27 NOTE — Assessment & Plan Note (Signed)
Discussed age-appropriate immunizations and screening exams.  Reviewed patient's personal, surgical, social, family histories.  Patient is up-to-date on all age-appropriate vaccinations she would like.  Patient is up-to-date on CRC screening.  Has done her cervical cancer screening this year.  Breast cancer screening mammograms been ordered through GYN.  Patient was given information at discharge about preventative healthcare maintenance with anticipatory guidance.

## 2023-01-27 NOTE — Assessment & Plan Note (Signed)
Patient currently maintained on benazepril 10 mg daily.  Does check blood pressure at home.  Tolerates medication well.  Blood pressure control.  Continue medication as prescribed

## 2023-01-28 LAB — URINALYSIS, MICROSCOPIC ONLY

## 2023-01-28 LAB — MICROALBUMIN / CREATININE URINE RATIO
Creatinine,U: 31.1 mg/dL
Microalb Creat Ratio: 2.3 mg/g (ref 0.0–30.0)
Microalb, Ur: <0.7 mg/dL (ref 0.0–1.9)

## 2023-01-28 LAB — LIPID PANEL
Cholesterol: 212 mg/dL — ABNORMAL HIGH (ref 0–200)
HDL: 67.4 mg/dL (ref >39.00)
LDL Cholesterol: 118 mg/dL — ABNORMAL HIGH (ref 0–99)
NonHDL: 144.79
Total CHOL/HDL Ratio: 3
Triglycerides: 133 mg/dL (ref 0.0–149.0)
VLDL: 26.6 mg/dL (ref 0.0–40.0)

## 2023-01-28 LAB — TSH: TSH: 1.71 u[IU]/mL (ref 0.35–5.50)

## 2023-02-01 ENCOUNTER — Ambulatory Visit: Payer: BC Managed Care – PPO | Admitting: Surgery

## 2023-02-01 ENCOUNTER — Encounter: Payer: Self-pay | Admitting: Surgery

## 2023-02-01 VITALS — BP 118/78 | HR 103 | Temp 98.3°F | Ht 65.0 in | Wt 168.4 lb

## 2023-02-01 DIAGNOSIS — R1310 Dysphagia, unspecified: Secondary | ICD-10-CM

## 2023-02-01 DIAGNOSIS — K2100 Gastro-esophageal reflux disease with esophagitis, without bleeding: Secondary | ICD-10-CM | POA: Diagnosis not present

## 2023-02-01 DIAGNOSIS — K449 Diaphragmatic hernia without obstruction or gangrene: Secondary | ICD-10-CM

## 2023-02-01 NOTE — Progress Notes (Unsigned)
Patient ID: Morgan [name], female   DOB: 1975/12/12, 47 y.o.   MRN: [id number]  HPI Morgan [name] is a 47 y.o. female seen in consultation for hiatal hernia.  She does have a chronic history of functional GI issues since 2020 with intermittent abdominal discomfort.  She reports most of her pain is to the right side it is dull intermittent and mild to moderate intensity.  The patient did have an appointment and visit with Dr.Wohl from gastroenterology. She did have a recent MRI that I have personally reviewed showing evidence of a moderate hiatal hernia with at least a third of the stomach within the mediastinum.  She had a segment of the terminal ileum that appeared some tethering without active inflammatory findings She does experience some intermittent reflux symptoms that he seems to be partially relieved by PPI.  She is interested in pursuing surgical options for repair of reflux and hiatal hernia She has had prior EGDs last 1 2 years ago showing evidence of a paraesophageal hernia.  She also had a prior esophageal dilation Surgical history includes hysterectomy, tubal ligation and partial thyroidectomy.  She does have a goiter that is causing no obstruction Cbc and cmp nml HPI  Past Medical History:  Diagnosis Date   Complication of anesthesia    STATES TOLD REINTUBATION WITH HYSTERECTOMY   Controlled diabetes mellitus type 2 with complications (HCC) 12/31/2021   Crohn's disease (HCC) 2024   Crohn's disease (HCC)    Difficult intubation    Pt unclear of what difficulty was.  Reports it was with 2010 back surgery and with hemi-thyroidectomy.  No problems in other surgeries.   GERD (gastroesophageal reflux disease)    Headache    MIGRAINES - improved with Ajovy   History of hiatal hernia    Hypertension    PONV (postoperative nausea and vomiting)    Stiff neck    mild limitations of up/down mvmt s/p neck surgery    Past Surgical History:  Procedure Laterality Date   ABDOMINAL  HYSTERECTOMY     BACK SURGERY  03/2020   lower back   BALLOON DILATION N/A 04/18/2018   Procedure: BALLOON DILATION;  Surgeon: [name], MD;  Location: [facility];  Service: Endoscopy;  Laterality: N/A;   COLONOSCOPY WITH PROPOFOL N/A 04/04/2021   Procedure: COLONOSCOPY WITH BIOPSY;  Surgeon: [name], MD;  Location: [facility];  Service: Endoscopy;  Laterality: N/A;  Latex   ESOPHAGOGASTRODUODENOSCOPY (EGD) WITH PROPOFOL N/A 04/18/2018   Procedure: ESOPHAGOGASTRODUODENOSCOPY (EGD) WITH BIOPSIES;  Surgeon: [name], MD;  Location: [facility];  Service: Endoscopy;  Laterality: N/A;   NECK SURGERY     FUSION C5-C7   OOPHORECTOMY     THYROIDECTOMY Left    (Hemi-)   TRACHELECTOMY N/A 10/15/2017   Procedure: TRACHELECTOMY;  Surgeon: Schermerhorn, Ihor Austin, MD;  Location: [facility];  Service: Gynecology;  Laterality: N/A;   TUBAL LIGATION      Family History  Problem Relation Age of Onset   Hypertension Mother    Arthritis Mother    Lung disease Mother    Arthritis Father    Stroke Father    Hypertension Father    Arthritis Maternal Grandmother    Heart disease Maternal Grandmother    Hypertension Maternal Grandmother    Arthritis Maternal Grandfather    Lung cancer Maternal Grandfather    Heart disease Maternal Grandfather    Arthritis Paternal Grandmother    Heart disease Paternal Grandmother    Hypertension Paternal  Grandmother    Arthritis Paternal Grandfather    Heart disease Paternal Grandfather    Stroke Paternal Grandfather    Heart disease Maternal Uncle     Social History Social History   Tobacco Use   Smoking status: Former    Current packs/day: 0.00    Average packs/day: 2.0 packs/day for 25.0 years (50.0 ttl pk-yrs)    Types: Cigarettes    Start date: 10/01/1991    Quit date: 09/30/2016    Years since quitting: 6.3   Smokeless tobacco: Never  Vaping Use   Vaping status: Never Used  Substance Use Topics   Alcohol use:  Not Currently    Comment: occasional   Drug use: No    Allergies  Allergen Reactions   Adhesive [Tape] Other (See Comments)    Bruises skin   Latex Other (See Comments)    Swelling (vaginal)   Lexapro [Escitalopram Oxalate] Other (See Comments)    Loss taste   Metoprolol Other (See Comments)    Lowers heart rate   Topamax [Topiramate]     Loss taste    Current Outpatient Medications  Medication Sig Dispense Refill   AJOVY 225 MG/1.5ML SOAJ Inject into the skin.     atorvastatin (LIPITOR) 10 MG tablet TAKE 1 TABLET BY MOUTH DAILY 30 tablet 0   benazepril (LOTENSIN) 10 MG tablet Take 1 tablet (10 mg total) by mouth daily. 90 tablet 3   cyclobenzaprine (FLEXERIL) 10 MG tablet TAKE 2 TABLETS BY MOUTH AT BEDTIME 180 tablet 1   dicyclomine (BENTYL) 10 MG capsule Take 1 capsule (10 mg total) by mouth 3 (three) times daily before meals. 270 capsule 2   gabapentin (NEURONTIN) 400 MG capsule Take 400 mg by mouth. Take 400 mg in the morning and 800 mg at night     nortriptyline (PAMELOR) 10 MG capsule Take 10 mg by mouth every morning.     nortriptyline (PAMELOR) 50 MG capsule Take 50 mg by mouth at bedtime.     omeprazole (PRILOSEC) 40 MG capsule TAKE 1 CAPSULE BY MOUTH TWICE A DAY 180 capsule 3   QUEtiapine (SEROQUEL) 100 MG tablet Take 100 mg by mouth at bedtime.     valACYclovir (VALTREX) 500 MG tablet TAKE 1 TABLET (500 MG TOTAL) BY MOUTH DAILY. 90 tablet 1   rizatriptan (MAXALT) 10 MG tablet Take by mouth. 1 tablet by mouth as directed     No current facility-administered medications for this visit.     Review of Systems Full ROS  was asked and was negative except for the information on the HPI  Physical Exam Blood pressure 118/78, pulse (!) 103, temperature 98.3 F (36.8 C), temperature source Oral, height 5\' 5"  (1.651 m), weight 168 lb 6.4 oz (76.4 kg), SpO2 99%. CONSTITUTIONAL: NAD. EYES: Pupils are equal, round, and reactive to light, Sclera are non-icteric. EARS, NOSE,  MOUTH AND THROAT: . The oral mucosa is pink and moist. Hearing is intact to voice. LYMPH NODES:  Lymph nodes in the neck are normal. Neck: goiter on the right RESPIRATORY:  Lungs are clear. There is normal respiratory effort, with equal breath sounds bilaterally, and without pathologic use of accessory muscles. CARDIOVASCULAR: Heart is regular without murmurs, gallops, or rubs. GI: The abdomen is  soft, nontender, and nondistended. There are no palpable masses. There is no hepatosplenomegaly. There are normal bowel sounds in all quadrants. GU: Rectal deferred.   MUSCULOSKELETAL: Normal muscle strength and tone. No cyanosis or edema.   SKIN: Turgor  is good and there are no pathologic skin lesions or ulcers. NEUROLOGIC: Motor and sensation is grossly normal. Cranial nerves are grossly intact. PSYCH:  Oriented to person, place and time. Affect is normal.  Data Reviewed  I have personally reviewed the patient's imaging, laboratory findings and medical records.    Assessment/Plan 47 year old female with significant moderate paraesophageal hernia type III in addition with some chronic functional GI issues that they cannot rule out Crohn's disease.  She certainly had some sort of enteritis.  At this time this seems to be controlled.  GI is following.  Discussed with her in detail regarding their hiatal hernia and different approaches including medical versus surgical management.  She seems to be interested in surgical repair of hiatal hernia and addressing the reflux with a fundoplication.  We did talk about what this entailed.  Potentially she might be a candidate for robotic repair of paraesophageal hernia repair with fundoplication.  We will proceed with appropriate workup in the form of a barium swallow as well as a repeat endoscopy.  We will see her back after she completes appropriate workup  I spent 55 minutes in this encounter including person reviewing imaging studies, coordinating her care,  placing orders and performing documentation    Sterling Big, MD FACS General Surgeon 02/01/2023, 3:24 PM

## 2023-02-01 NOTE — Patient Instructions (Addendum)
The Barium Swallow test is scheduled for 02/03/2023 3pm (arrive by 2:45 pm) at [facility]. Nothing to eat or drink 3 hours prior.   A referral has been placed to [facility] (Dr. Servando Snare) for an Endoscopy. They will call you for an appointment.   If you have any concerns or questions, please feel free to call our office.   If you have any concerns or questions, please feel free to call our office.   Hiatal Hernia  A hiatal hernia occurs when part of the stomach slides above the muscle that separates the abdomen from the chest (diaphragm). A person can be born with a hiatal hernia (congenital), or it may develop over time. In almost all cases of hiatal hernia, only the top part of the stomach pushes through the diaphragm. Many people have a hiatal hernia with no symptoms. The larger the hernia, the more likely it is that you will have symptoms. In some cases, a hiatal hernia allows stomach acid to flow back into the tube that carries food from your mouth to your stomach (esophagus). This may cause heartburn symptoms. The development of heartburn symptoms may mean that you have a condition called gastroesophageal reflux disease (GERD). What are the causes? This condition is caused by a weakness in the opening (hiatus) where the esophagus passes through the diaphragm to attach to the upper part of the stomach. A person may be born with a weakness in the hiatus, or a weakness can develop over time. What increases the risk? This condition is more likely to develop in: Older people. Age is a major risk factor for a hiatal hernia, especially if you are over the age of 47. Pregnant women. People who are overweight. People who have frequent constipation. What are the signs or symptoms? Symptoms of this condition usually develop in the form of GERD symptoms. Symptoms include: Heartburn. Upset stomach (indigestion). Trouble swallowing. Coughing or wheezing. Wheezing is making high-pitched whistling  sounds when you breathe. Sore throat. Chest pain. Nausea and vomiting. How is this diagnosed? This condition may be diagnosed during testing for GERD. Tests that may be done include: X-rays of your stomach or chest. An upper gastrointestinal (GI) series. This is an X-ray exam of your GI tract that is taken after you swallow a chalky liquid that shows up clearly on the X-ray. Endoscopy. This is a procedure to look into your stomach using a thin, flexible tube that has a tiny camera and light on the end of it. How is this treated? This condition may be treated by: Dietary and lifestyle changes to help reduce GERD symptoms. Medicines. These may include: Over-the-counter antacids. Medicines that make your stomach empty more quickly. Medicines that block the production of stomach acid (H2 blockers). Stronger medicines to reduce stomach acid (proton pump inhibitors). Surgery to repair the hernia, if other treatments are not helping. If you have no symptoms, you may not need treatment. Follow these instructions at home: Lifestyle and activity Do not use any products that contain nicotine or tobacco. These products include cigarettes, chewing tobacco, and vaping devices, such as e-cigarettes. If you need help quitting, ask your health care provider. Try to achieve and maintain a healthy body weight. Avoid putting pressure on your abdomen. Anything that puts pressure on your abdomen increases the amount of acid that may be pushed up into your esophagus. Avoid bending over, especially after eating. Raise the head of your bed by putting blocks under the legs. This keeps your head  and esophagus higher than your stomach. Do not wear tight clothing around your chest or stomach. Try not to strain when having a bowel movement, when urinating, or when lifting heavy objects. Eating and drinking Avoid foods that can worsen GERD symptoms. These may include: Fatty foods, like fried foods. Citrus fruits,  like oranges or lemon. Other foods and drinks that contain acid, like orange juice or tomatoes. Spicy food. Chocolate. Eat frequent small meals instead of three large meals a day. This helps prevent your stomach from getting too full. Eat slowly. Do not lie down right after eating. Do not eat 1-2 hours before bed. Do not drink beverages with caffeine. These include cola, coffee, cocoa, and tea. Do not drink alcohol. General instructions Take over-the-counter and prescription medicines only as told by your health care provider. Keep all follow-up visits. Your health care provider will want to check that any new prescribed medicines are helping your symptoms. Contact a health care provider if: Your symptoms are not controlled with medicines or lifestyle changes. You are having trouble swallowing. You have coughing or wheezing that will not go away. Your pain is getting worse. Your pain spreads to your arms, neck, jaw, teeth, or back. You feel nauseous or you vomit. Get help right away if: You have shortness of breath. You vomit blood. You have bright red blood in your stools. You have black, tarry stools. These symptoms may be an emergency. Get help right away. Call 911. Do not wait to see if the symptoms will go away. Do not drive yourself to the hospital. Summary A hiatal hernia occurs when part of the stomach slides above the muscle that separates the abdomen from the chest. A person may be born with a weakness in the hiatus, or a weakness can develop over time. Symptoms of a hiatal hernia may include heartburn, trouble swallowing, or sore throat. Management of a hiatal hernia includes eating frequent small meals instead of three large meals a day. Get help right away if you vomit blood, have bright red blood in your stools, or have black, tarry stools. This information is not intended to replace advice given to you by your health care provider. Make sure you discuss any questions  you have with your health care provider. Document Revised: 06/03/2021 Document Reviewed: 06/03/2021 Elsevier Patient Education  2024 [employer].

## 2023-02-03 ENCOUNTER — Ambulatory Visit
Admission: RE | Admit: 2023-02-03 | Discharge: 2023-02-03 | Disposition: A | Discharge status: Home / Self Care | Payer: BC Managed Care – PPO | Source: Ambulatory Visit | Attending: Surgery | Admitting: Surgery

## 2023-02-03 DIAGNOSIS — R1310 Dysphagia, unspecified: Secondary | ICD-10-CM | POA: Insufficient documentation

## 2023-02-03 DIAGNOSIS — Z981 Arthrodesis status: Secondary | ICD-10-CM | POA: Diagnosis not present

## 2023-02-03 DIAGNOSIS — K219 Gastro-esophageal reflux disease without esophagitis: Secondary | ICD-10-CM | POA: Diagnosis not present

## 2023-02-03 DIAGNOSIS — K224 Dyskinesia of esophagus: Secondary | ICD-10-CM | POA: Diagnosis not present

## 2023-02-03 DIAGNOSIS — K449 Diaphragmatic hernia without obstruction or gangrene: Secondary | ICD-10-CM | POA: Diagnosis not present

## 2023-02-04 ENCOUNTER — Other Ambulatory Visit: Payer: Self-pay

## 2023-02-04 DIAGNOSIS — K2100 Gastro-esophageal reflux disease with esophagitis, without bleeding: Secondary | ICD-10-CM

## 2023-02-04 DIAGNOSIS — R1310 Dysphagia, unspecified: Secondary | ICD-10-CM

## 2023-02-08 DIAGNOSIS — G479 Sleep disorder, unspecified: Secondary | ICD-10-CM | POA: Diagnosis not present

## 2023-02-08 DIAGNOSIS — F419 Anxiety disorder, unspecified: Secondary | ICD-10-CM | POA: Diagnosis not present

## 2023-02-08 DIAGNOSIS — G43709 Chronic migraine without aura, not intractable, without status migrainosus: Secondary | ICD-10-CM | POA: Diagnosis not present

## 2023-02-08 DIAGNOSIS — R413 Other amnesia: Secondary | ICD-10-CM | POA: Diagnosis not present

## 2023-02-09 ENCOUNTER — Telehealth: Payer: Self-pay | Admitting: Gastroenterology

## 2023-02-09 NOTE — Telephone Encounter (Signed)
Patient called in to see if an earlier appointment appointment was available for Dr. Tobi Bastos.

## 2023-02-11 ENCOUNTER — Encounter: Payer: Self-pay | Admitting: Gastroenterology

## 2023-02-16 ENCOUNTER — Other Ambulatory Visit: Payer: Self-pay

## 2023-02-16 ENCOUNTER — Ambulatory Visit (INDEPENDENT_AMBULATORY_CARE_PROVIDER_SITE_OTHER): Payer: BC Managed Care – PPO | Admitting: Gastroenterology

## 2023-02-16 ENCOUNTER — Encounter: Payer: Self-pay | Admitting: Gastroenterology

## 2023-02-16 VITALS — BP 125/78 | HR 99 | Temp 98.2°F | Ht 65.0 in | Wt 163.4 lb

## 2023-02-16 DIAGNOSIS — R935 Abnormal findings on diagnostic imaging of other abdominal regions, including retroperitoneum: Secondary | ICD-10-CM

## 2023-02-16 DIAGNOSIS — R933 Abnormal findings on diagnostic imaging of other parts of digestive tract: Secondary | ICD-10-CM | POA: Diagnosis not present

## 2023-02-16 MED ORDER — NA SULFATE-K SULFATE-MG SULF 17.5-3.13-1.6 GM/177ML PO SOLN: 354.0000 mL | Freq: Once | ORAL | 0 refills | Status: AC

## 2023-02-16 NOTE — Addendum Note (Signed)
Addended by: Radene Knee L on: 02/16/2023 02:46 PM   Modules accepted: Orders

## 2023-02-16 NOTE — Progress Notes (Signed)
Wyline Mood MD, MRCP(U.K) [address]  [address]  [place], [place] [address]  Main: [phone]  Fax: [phone]   Primary Care Physician: Eden Emms, NP  Primary Gastroenterologist:  Dr. Wyline Mood   Chief Complaint  Patient presents with   Crohn's Disease    HPI: Morgan [name] is a 47 y.o. female    Summary of history :  Initially seen by Dr. Servando Snare on 12/08/2022 for right-sided abdominal pain there was a concern for possible Crohn's disease in 2020 on colonoscopy.  CT scan at that time showed thickening of the terminal ileum subsequent colonoscopy did not show any evidence of signs of inflammatory bowel disease.  Interval history 12/08/2022-02/16/2023   01/14/2023 MR enterography showed mildly thickened tethering appearing hyperenhancing terminal ileum 6 to 8 cm in length findings suggestive of nonspecific ileitis findings are diminished compared to prior examination 2022   She states that she has had right lower quadrant pain going on for 2 years.  Denies any NSAID use.  She has a family history of celiac disease.  A fecal calprotectin was elevated and on 12/09/2022.  Denies any diarrhea sometimes, some constipation  Current Outpatient Medications  Medication Sig Dispense Refill   AJOVY 225 MG/1.5ML SOAJ Inject into the skin.     atorvastatin (LIPITOR) 10 MG tablet TAKE 1 TABLET BY MOUTH DAILY 30 tablet 0   benazepril (LOTENSIN) 10 MG tablet Take 1 tablet (10 mg total) by mouth daily. 90 tablet 3   clonazePAM (KLONOPIN) 0.5 MG tablet Take 0.25 mg by mouth 2 (two) times daily.     cyclobenzaprine (FLEXERIL) 10 MG tablet TAKE 2 TABLETS BY MOUTH AT BEDTIME 180 tablet 1   dicyclomine (BENTYL) 10 MG capsule Take 1 capsule (10 mg total) by mouth 3 (three) times daily before meals. 270 capsule 2   gabapentin (NEURONTIN) 400 MG capsule Take 400 mg by mouth. Take 400 mg in the morning and 800 mg at night     nortriptyline (PAMELOR) 10 MG capsule Take 10 mg by mouth  every morning.     nortriptyline (PAMELOR) 50 MG capsule Take 50 mg by mouth at bedtime.     omeprazole (PRILOSEC) 40 MG capsule TAKE 1 CAPSULE BY MOUTH TWICE A DAY 180 capsule 3   QUEtiapine (SEROQUEL) 100 MG tablet Take 100 mg by mouth at bedtime.     valACYclovir (VALTREX) 500 MG tablet TAKE 1 TABLET (500 MG TOTAL) BY MOUTH DAILY. 90 tablet 1   rizatriptan (MAXALT) 10 MG tablet Take by mouth. 1 tablet by mouth as directed     No current facility-administered medications for this visit.    Allergies as of 02/16/2023 - Review Complete 02/16/2023  Allergen Reaction Noted   Adhesive [tape] Other (See Comments) 09/24/2017   Latex Other (See Comments) 09/24/2017   Lexapro [escitalopram oxalate] Other (See Comments) 10/15/2016   Metoprolol Other (See Comments) 10/29/2016   Topamax [topiramate]  10/15/2016      ROS:  General: Negative for anorexia, weight loss, fever, chills, fatigue, weakness. ENT: Negative for hoarseness, difficulty swallowing , nasal congestion. CV: Negative for chest pain, angina, palpitations, dyspnea on exertion, peripheral edema.  Respiratory: Negative for dyspnea at rest, dyspnea on exertion, cough, sputum, wheezing.  GI: See history of present illness. GU:  Negative for dysuria, hematuria, urinary incontinence, urinary frequency, nocturnal urination.  Endo: Negative for unusual weight change.    Physical Examination:   BP 125/78 (BP Location: Left Arm, Patient Position: Sitting, Cuff  Size: Normal)   Pulse 99   Temp 98.2 F (36.8 C) (Oral)   Ht 5\' 5"  (1.651 m)   Wt 163 lb 6 oz (74.1 kg)   BMI 27.19 kg/m   General: Well-nourished, well-developed in no acute distress.  Eyes: No icterus. Conjunctivae pink. Mouth: Oropharyngeal mucosa moist and pink , no lesions erythema or exudate. Neuro: Alert and oriented x 3.  Grossly intact. Skin: Warm and dry, no jaundice.   Psych: Alert and cooperative, normal mood and affect.   Imaging Studies: DG ESOPHAGUS W  DOUBLE CM (HD)  Result Date: 02/03/2023 CLINICAL DATA:  Patient with a history of hiatal hernia and reflux. EXAM: ESOPHAGUS/BARIUM SWALLOW/TABLET STUDY TECHNIQUE: Combined double and single contrast examination was performed using effervescent crystals, high-density barium, and thin liquid barium. This exam was performed by Alwyn Ren, NP, and was supervised and interpreted by Dr. Ramiro Harvest. FLUOROSCOPY: Radiation Exposure Index (as provided by the fluoroscopic device): 19.20 mGy Kerma COMPARISON:  None Available. FINDINGS: Swallowing: Appears normal. No vestibular penetration or aspiration seen. Pharynx: Unremarkable. Esophagus: Normal appearance. Esophageal motility: Mild dysmotility with tertiary contractions observed. Hiatal Hernia: There is a moderate sized sliding hiatal hernia. Gastroesophageal reflux: Large volume spontaneous reflux observed to the level of the upper thoracic esophagus. Ingested 13mm barium tablet: Passed normally Other: Prior ACDF at C5-6. IMPRESSION: *Moderate sized sliding hiatal hernia. *Large volume spontaneous reflux observed to the level of the upper thoracic esophagus. *Mild dysmotility with tertiary contractions. Electronically Signed   By: Jules Schick M.D.   On: 02/03/2023 16:02    Assessment and Plan:   Morgan [name] is a 48 y.o. y/o female patient who was initially seen by Dr. Servando Snare and there has been some concerns about small bowel Crohn's disease.  Biopsies in 2022 showed no active inflammation.  Recent MR enterography showed features suggestive of possible ileitis.  She is symptomatic.  Explained to her that with longstanding symptoms it makes the possibility of Crohn's affecting the small bowel highly likely but ideally we would like to get some form of histological confirmation if possible because this is a lifelong disease.  Plan 1.  Check fecal calprotectin CBC CMP CRP.  If CRP and fecal calprotectin is still elevated indicates active inflammation and makes  it more likely that she has Crohn's disease 2.  Colonoscopy with intubation of the terminal ileum we will attempt to go as far deeply possible and take multiple biopsies as sometimes the disease may be patchy with skip areas. 3.  Absolutely no NSAIDs no cigarette smoking 4.  Check celiac serology and if positive will require duodenal biopsies during upper endoscopy which she is already scheduled with   I will have the office schedule both procedures either with Dr. Servando Snare or with myself which ever may be available   I have discussed alternative options, risks & benefits,  which include, but are not limited to, bleeding, infection, perforation,respiratory complication & drug reaction.  The patient agrees with this plan & written consent will be obtained.      Dr Wyline Mood  MD,MRCP [facility]) Follow up in my office 2 weeks after her procedures to discuss results and treat if appropriate

## 2023-02-18 ENCOUNTER — Ambulatory Visit: Payer: BC Managed Care – PPO | Admitting: Anesthesiology

## 2023-02-18 ENCOUNTER — Ambulatory Visit
Admission: RE | Admit: 2023-02-18 | Discharge: 2023-02-18 | Disposition: A | Discharge status: Home / Self Care | Payer: BC Managed Care – PPO | Attending: Gastroenterology | Admitting: Gastroenterology

## 2023-02-18 ENCOUNTER — Encounter
Admission: RE | Disposition: A | Discharge status: Home / Self Care | Payer: Self-pay | Source: Home / Self Care | Attending: Gastroenterology

## 2023-02-18 ENCOUNTER — Encounter: Payer: Self-pay | Admitting: Gastroenterology

## 2023-02-18 DIAGNOSIS — K222 Esophageal obstruction: Secondary | ICD-10-CM | POA: Diagnosis not present

## 2023-02-18 DIAGNOSIS — Z87891 Personal history of nicotine dependence: Secondary | ICD-10-CM | POA: Insufficient documentation

## 2023-02-18 DIAGNOSIS — I10 Essential (primary) hypertension: Secondary | ICD-10-CM | POA: Insufficient documentation

## 2023-02-18 DIAGNOSIS — R1310 Dysphagia, unspecified: Secondary | ICD-10-CM | POA: Insufficient documentation

## 2023-02-18 DIAGNOSIS — K449 Diaphragmatic hernia without obstruction or gangrene: Secondary | ICD-10-CM | POA: Diagnosis not present

## 2023-02-18 DIAGNOSIS — K219 Gastro-esophageal reflux disease without esophagitis: Secondary | ICD-10-CM | POA: Insufficient documentation

## 2023-02-18 DIAGNOSIS — E119 Type 2 diabetes mellitus without complications: Secondary | ICD-10-CM | POA: Insufficient documentation

## 2023-02-18 DIAGNOSIS — K5090 Crohn's disease, unspecified, without complications: Secondary | ICD-10-CM | POA: Diagnosis not present

## 2023-02-18 DIAGNOSIS — K2100 Gastro-esophageal reflux disease with esophagitis, without bleeding: Secondary | ICD-10-CM

## 2023-02-18 HISTORY — PX: ESOPHAGOGASTRODUODENOSCOPY (EGD) WITH PROPOFOL: SHX5813

## 2023-02-18 LAB — COMPREHENSIVE METABOLIC PANEL
ALT: 14 [IU]/L (ref 0–32)
AST: 16 [IU]/L (ref 0–40)
Albumin: 4.4 g/dL (ref 3.9–4.9)
Alkaline Phosphatase: 116 [IU]/L (ref 44–121)
BUN/Creatinine Ratio: 9 (ref 9–23)
BUN: 8 mg/dL (ref 6–24)
Bilirubin Total: <0.2 mg/dL (ref 0.0–1.2)
CO2: 27 mmol/L (ref 20–29)
Calcium: 8.9 mg/dL (ref 8.7–10.2)
Chloride: 105 mmol/L (ref 96–106)
Creatinine, Ser: 0.87 mg/dL (ref 0.57–1.00)
Globulin, Total: 2.6 g/dL (ref 1.5–4.5)
Glucose: 90 mg/dL (ref 70–99)
Potassium: 3.9 mmol/L (ref 3.5–5.2)
Sodium: 145 mmol/L — ABNORMAL HIGH (ref 134–144)
Total Protein: 7 g/dL (ref 6.0–8.5)
eGFR: 84 mL/min/{1.73_m2} (ref >59)

## 2023-02-18 LAB — CBC WITH DIFFERENTIAL/PLATELET
Basophils Absolute: 0.1 10*3/uL (ref 0.0–0.2)
Basos: 1 %
EOS (ABSOLUTE): 0.2 10*3/uL (ref 0.0–0.4)
Eos: 3 %
Hematocrit: 38 % (ref 34.0–46.6)
Hemoglobin: 12 g/dL (ref 11.1–15.9)
Immature Grans (Abs): 0 10*3/uL (ref 0.0–0.1)
Immature Granulocytes: 0 %
Lymphocytes Absolute: 2 10*3/uL (ref 0.7–3.1)
Lymphs: 29 %
MCH: 27.3 pg (ref 26.6–33.0)
MCHC: 31.6 g/dL (ref 31.5–35.7)
MCV: 86 fL (ref 79–97)
Monocytes Absolute: 0.5 10*3/uL (ref 0.1–0.9)
Monocytes: 8 %
Neutrophils Absolute: 4.1 10*3/uL (ref 1.4–7.0)
Neutrophils: 59 %
Platelets: 289 10*3/uL (ref 150–450)
RBC: 4.4 x10E6/uL (ref 3.77–5.28)
RDW: 15 % (ref 11.7–15.4)
WBC: 7 10*3/uL (ref 3.4–10.8)

## 2023-02-18 LAB — CELIAC DISEASE AB SCREEN W/RFX
Antigliadin Abs, IgA: 3 U (ref 0–19)
IgA/Immunoglobulin A, Serum: 277 mg/dL (ref 87–352)
Transglutaminase IgA: <2 U/mL (ref 0–3)

## 2023-02-18 LAB — C-REACTIVE PROTEIN: CRP: 2 mg/L (ref 0–10)

## 2023-02-18 SURGERY — ESOPHAGOGASTRODUODENOSCOPY (EGD) WITH PROPOFOL
Anesthesia: General

## 2023-02-18 MED ORDER — SODIUM CHLORIDE 0.9 % IV SOLN: INTRAVENOUS | Status: DC

## 2023-02-18 MED ORDER — DEXMEDETOMIDINE HCL IN NACL 80 MCG/20ML IV SOLN
INTRAVENOUS | Status: DC | PRN
  Administered 2023-02-18: 12 ug via INTRAVENOUS

## 2023-02-18 MED ORDER — PROPOFOL 10 MG/ML IV BOLUS
INTRAVENOUS | Status: DC | PRN
  Administered 2023-02-18: 30 mg via INTRAVENOUS
  Administered 2023-02-18: 70 mg via INTRAVENOUS
  Administered 2023-02-18: 30 mg via INTRAVENOUS

## 2023-02-18 NOTE — Anesthesia Preprocedure Evaluation (Signed)
Anesthesia Evaluation  Patient identified by MRN, date of birth, ID band Patient awake    Reviewed: Allergy & Precautions, NPO status , Patient's Chart, lab work & pertinent test results  History of Anesthesia Complications (+) DIFFICULT AIRWAY and history of anesthetic complications  Airway Mallampati: III  TM Distance: <3 FB Neck ROM: Full    Dental  (+) Teeth Intact   Pulmonary neg pulmonary ROS, Patient abstained from smoking., former smoker   Pulmonary exam normal breath sounds clear to auscultation       Cardiovascular Exercise Tolerance: Good hypertension, Pt. on medications negative cardio ROS Normal cardiovascular exam Rhythm:Regular Rate:Normal     Neuro/Psych  Headaches negative neurological ROS  negative psych ROS   GI/Hepatic negative GI ROS, Neg liver ROS, hiatal hernia,GERD  Medicated,,  Endo/Other  negative endocrine ROSdiabetes, Type 2    Renal/GU negative Renal ROS  negative genitourinary   Musculoskeletal   Abdominal Normal abdominal exam  (+)   Peds negative pediatric ROS (+)  Hematology negative hematology ROS (+)   Anesthesia Other Findings Past Medical History: No date: Complication of anesthesia     Comment:  STATES TOLD REINTUBATION WITH HYSTERECTOMY 12/31/2021: Controlled diabetes mellitus type 2 with complications  (HCC) 2024: Crohn's disease (HCC) No date: Crohn's disease (HCC) No date: Difficult intubation     Comment:  Pt unclear of what difficulty was.  Reports it was with               2010 back surgery and with hemi-thyroidectomy.  No               problems in other surgeries. No date: GERD (gastroesophageal reflux disease) No date: Headache     Comment:  MIGRAINES - improved with Ajovy No date: History of hiatal hernia No date: Hypertension No date: PONV (postoperative nausea and vomiting) No date: Stiff neck     Comment:  mild limitations of up/down mvmt s/p neck  surgery  Past Surgical History: No date: ABDOMINAL HYSTERECTOMY 03/2020: BACK SURGERY     Comment:  lower back 04/18/2018: BALLOON DILATION; N/A     Comment:  Procedure: BALLOON DILATION;  Surgeon: [name], MD;              Location: [facility];  Service: Endoscopy;                Laterality: N/A; 04/04/2021: COLONOSCOPY WITH PROPOFOL; N/A     Comment:  Procedure: COLONOSCOPY WITH BIOPSY;  Surgeon: [name], MD;  Location: [facility];  Service:               Endoscopy;  Laterality: N/A;  Latex 04/18/2018: ESOPHAGOGASTRODUODENOSCOPY (EGD) WITH PROPOFOL; N/A     Comment:  Procedure: ESOPHAGOGASTRODUODENOSCOPY (EGD) WITH               BIOPSIES;  Surgeon: [name], MD;  Location: [facility];  Service: Endoscopy;  Laterality: N/A; No date: NECK SURGERY     Comment:  FUSION C5-C7 No date: OOPHORECTOMY No date: THYROIDECTOMY; Left     Comment:  (Hemi-) 10/15/2017: TRACHELECTOMY; N/A     Comment:  Procedure: TRACHELECTOMY;  Surgeon: Schermerhorn, Ihor Austin, MD;  Location: [facility];  Service: Gynecology;  Laterality: N/A; No date: TUBAL LIGATION     Reproductive/Obstetrics negative OB ROS                             Anesthesia Physical Anesthesia Plan  ASA: 2  Anesthesia Plan: General   Post-op Pain Management:    Induction: Intravenous  PONV Risk Score and Plan: Propofol infusion and TIVA  Airway Management Planned: Natural Airway and Nasal Cannula  Additional Equipment:   Intra-op Plan:   Post-operative Plan:   Informed Consent: I have reviewed the patients History and Physical, chart, labs and discussed the procedure including the risks, benefits and alternatives for the proposed anesthesia with the patient or authorized representative who has indicated his/her understanding and acceptance.     Dental Advisory Given  Plan Discussed with: CRNA and  Surgeon  Anesthesia Plan Comments:        Anesthesia Quick Evaluation

## 2023-02-18 NOTE — Op Note (Signed)
[facility] Gastroenterology Patient Name: Morgan [name] Procedure Date: 02/18/2023 10:30 AM MRN: [id number] Account #: [id number] Date of Birth: [birth year] Admit Type: Outpatient Age: 47 Room: [facility] ENDO ROOM 4 Gender: Female Note Status: Finalized Instrument Name: Patton Salles Endoscope [id number] Procedure:             Upper GI endoscopy Indications:           Dysphagia Providers:             [name] MD, MD Referring MD:          Genene Churn. Cable (Referring MD) Medicines:             Propofol per Anesthesia Complications:         No immediate complications. Procedure:             Pre-Anesthesia Assessment:                        - Prior to the procedure, a History and Physical was                         performed, and patient medications and allergies were                         reviewed. The patient's tolerance of previous                         anesthesia was also reviewed. The risks and benefits                         of the procedure and the sedation options and risks                         were discussed with the patient. All questions were                         answered, and informed consent was obtained. Prior                         Anticoagulants: The patient has taken no anticoagulant                         or antiplatelet agents. ASA Grade Assessment: II - A                         patient with mild systemic disease. After reviewing                         the risks and benefits, the patient was deemed in                         satisfactory condition to undergo the procedure.                        After obtaining informed consent, the endoscope was                         passed under direct vision. Throughout the procedure,  the patient's blood pressure, pulse, and oxygen                         saturations were monitored continuously. The Endoscope                         was introduced through the mouth, and advanced to the                          second part of duodenum. The upper GI endoscopy was                         accomplished without difficulty. The patient tolerated                         the procedure well. Findings:      A 7 cm hiatal hernia was present.      The stomach was normal.      The examined duodenum was normal.      A TTS dilator was passed through the scope. Dilation with a 15-16.5-18       mm balloon dilator was performed to 18 mm in the entire esophagus. Impression:            - 7 cm hiatal hernia.                        - Normal stomach.                        - Normal examined duodenum.                        - Dilation performed in the entire esophagus.                        - No specimens collected. Recommendation:        - Discharge patient to home.                        - Resume previous diet.                        - Continue present medications. Procedure Code(s):     --- Professional ---                        [phone], Esophagogastroduodenoscopy, flexible,                         transoral; with transendoscopic balloon dilation of                         esophagus (less than 30 mm diameter) Diagnosis Code(s):     --- Professional ---                        R13.10, Dysphagia, unspecified CPT copyright 2022 American Medical Association. All rights reserved. The codes documented in this report are preliminary and upon coder review may  be revised to meet current compliance requirements. [name] MD, MD 02/18/2023 10:42:24 AM This report has been signed electronically. Number of Addenda: 0 Note  Initiated On: 02/18/2023 10:30 AM Estimated Blood Loss:  Estimated blood loss: none.      [facility]

## 2023-02-18 NOTE — Plan of Care (Signed)
 [facility] Tonsillectomy/Adenoidectomy, Postoperative PEDS care plan entered in error.

## 2023-02-18 NOTE — Anesthesia Postprocedure Evaluation (Signed)
Anesthesia Post Note  Patient: Morgan [name]  Procedure(s) Performed: ESOPHAGOGASTRODUODENOSCOPY (EGD) WITH PROPOFOL Balloon dilation wire-guided  Patient location during evaluation: PACU Anesthesia Type: General Level of consciousness: awake and awake and alert Pain management: satisfactory to patient Vital Signs Assessment: post-procedure vital signs reviewed and stable Respiratory status: spontaneous breathing Cardiovascular status: stable Anesthetic complications: no   No notable events documented.   Last Vitals:  Vitals:   02/18/23 0957 02/18/23 1044  BP: 128/72 92/63  Pulse: 98 82  Resp: 20 17  Temp: (!) 36.2 C (!) 36.3 C  SpO2: 100% 98%    Last Pain:  Vitals:   02/18/23 1050  TempSrc:   PainSc: 0-No pain                 VAN STAVEREN,Vashti Bolanos

## 2023-02-18 NOTE — H&P (Signed)
[name], MD [facility] [address], [address] [place], [place] [address] Phone:[phone] Fax : [phone]  Primary Care Physician:  Eden Emms, NP Primary Gastroenterologist:  Dr. Servando Snare  Pre-Procedure History & Physical: HPI:  Morgan [name] is a 47 y.o. female is here for an endoscopy.   Past Medical History:  Diagnosis Date   Complication of anesthesia    STATES TOLD REINTUBATION WITH HYSTERECTOMY   Controlled diabetes mellitus type 2 with complications (HCC) 12/31/2021   Crohn's disease (HCC) 2024   Crohn's disease (HCC)    Difficult intubation    Pt unclear of what difficulty was.  Reports it was with 2010 back surgery and with hemi-thyroidectomy.  No problems in other surgeries.   GERD (gastroesophageal reflux disease)    Headache    MIGRAINES - improved with Ajovy   History of hiatal hernia    Hypertension    PONV (postoperative nausea and vomiting)    Stiff neck    mild limitations of up/down mvmt s/p neck surgery    Past Surgical History:  Procedure Laterality Date   ABDOMINAL HYSTERECTOMY     BACK SURGERY  03/2020   lower back   BALLOON DILATION N/A 04/18/2018   Procedure: BALLOON DILATION;  Surgeon: [name], MD;  Location: [facility];  Service: Endoscopy;  Laterality: N/A;   COLONOSCOPY WITH PROPOFOL N/A 04/04/2021   Procedure: COLONOSCOPY WITH BIOPSY;  Surgeon: [name], MD;  Location: [facility];  Service: Endoscopy;  Laterality: N/A;  Latex   ESOPHAGOGASTRODUODENOSCOPY (EGD) WITH PROPOFOL N/A 04/18/2018   Procedure: ESOPHAGOGASTRODUODENOSCOPY (EGD) WITH BIOPSIES;  Surgeon: [name], MD;  Location: [facility];  Service: Endoscopy;  Laterality: N/A;   NECK SURGERY     FUSION C5-C7   OOPHORECTOMY     THYROIDECTOMY Left    (Hemi-)   TRACHELECTOMY N/A 10/15/2017   Procedure: TRACHELECTOMY;  Surgeon: Schermerhorn, Ihor Austin, MD;  Location: [facility];  Service: Gynecology;  Laterality: N/A;   TUBAL LIGATION       Prior to Admission medications   Medication Sig Start Date End Date Taking? Authorizing Provider  benazepril (LOTENSIN) 10 MG tablet Take 1 tablet (10 mg total) by mouth daily. 10/08/22  Yes Dugal, Tabitha, FNP  AJOVY 225 MG/1.5ML SOAJ Inject into the skin. 03/10/21   [provider]  atorvastatin (LIPITOR) 10 MG tablet TAKE 1 TABLET BY MOUTH DAILY 01/07/23   Eden Emms, NP  clonazePAM (KLONOPIN) 0.5 MG tablet Take 0.25 mg by mouth 2 (two) times daily. 02/09/23   [provider]  cyclobenzaprine (FLEXERIL) 10 MG tablet TAKE 2 TABLETS BY MOUTH AT BEDTIME 10/07/22   Eden Emms, NP  dicyclomine (BENTYL) 10 MG capsule Take 1 capsule (10 mg total) by mouth 3 (three) times daily before meals. 12/08/22   [name], MD  gabapentin (NEURONTIN) 400 MG capsule Take 400 mg by mouth. Take 400 mg in the morning and 800 mg at night 08/13/22   [provider]  nortriptyline (PAMELOR) 10 MG capsule Take 10 mg by mouth every morning.    [provider]  nortriptyline (PAMELOR) 50 MG capsule Take 50 mg by mouth at bedtime.    [provider]  omeprazole (PRILOSEC) 40 MG capsule TAKE 1 CAPSULE BY MOUTH TWICE A DAY 12/07/22   Eden Emms, NP  QUEtiapine (SEROQUEL) 100 MG tablet Take 100 mg by mouth at bedtime.    [provider]  rizatriptan (MAXALT) 10 MG tablet Take by  mouth. 1 tablet by mouth as directed 01/02/21 01/02/22  [provider]  valACYclovir (VALTREX) 500 MG tablet TAKE 1 TABLET (500 MG TOTAL) BY MOUTH DAILY. 10/07/22   Eden Emms, NP    Allergies as of 02/04/2023 - Review Complete 02/01/2023  Allergen Reaction Noted   Adhesive [tape] Other (See Comments) 09/24/2017   Latex Other (See Comments) 09/24/2017   Lexapro [escitalopram oxalate] Other (See Comments) 10/15/2016   Metoprolol Other (See Comments) 10/29/2016   Topamax [topiramate]  10/15/2016    Family History  Problem Relation Age of Onset   Hypertension Mother     Arthritis Mother    Lung disease Mother    Arthritis Father    Stroke Father    Hypertension Father    Arthritis Maternal Grandmother    Heart disease Maternal Grandmother    Hypertension Maternal Grandmother    Arthritis Maternal Grandfather    Lung cancer Maternal Grandfather    Heart disease Maternal Grandfather    Arthritis Paternal Grandmother    Heart disease Paternal Grandmother    Hypertension Paternal Grandmother    Arthritis Paternal Grandfather    Heart disease Paternal Grandfather    Stroke Paternal Grandfather    Heart disease Maternal Uncle     Social History   Socioeconomic History   Marital status: Married    Spouse name: Not on file   Number of children: Not on file   Years of education: Not on file   Highest education level: Not on file  Occupational History   Not on file  Tobacco Use   Smoking status: Former    Current packs/day: 0.00    Average packs/day: 2.0 packs/day for 25.0 years (50.0 ttl pk-yrs)    Types: Cigarettes    Start date: 10/01/1991    Quit date: 09/30/2016    Years since quitting: 6.3   Smokeless tobacco: Never  Vaping Use   Vaping status: Never Used  Substance and Sexual Activity   Alcohol use: Not Currently    Comment: occasional   Drug use: No   Sexual activity: Yes  Other Topics Concern   Not on file  Social History Narrative   Not on file   Social Determinants of Health   Financial Resource Strain: Not on file  Food Insecurity: No Food Insecurity (01/18/2023)   Received from [facility]   Hunger Vital Sign    Worried About Running Out of Food in the Last Year: Never true    Ran Out of Food in the Last Year: Never true  Transportation Needs: No Transportation Needs (01/18/2023)   Received from [facility] - Transportation    In the past 12 months, has lack of transportation kept you from medical appointments or from getting medications?: No    Lack of Transportation  (Non-Medical): No  Physical Activity: Not on file  Stress: Not on file  Social Connections: Not on file  Intimate Partner Violence: Not on file    Review of Systems: See HPI, otherwise negative ROS  Physical Exam: BP 128/72   Pulse 98   Temp (!) 97.1 F (36.2 C) (Temporal)   Resp 20   SpO2 100%  General:   Alert,  pleasant and cooperative in NAD Head:  Normocephalic and atraumatic. Neck:  Supple; no masses or thyromegaly. Lungs:  Clear throughout to auscultation.    Heart:  Regular rate and rhythm. Abdomen:  Soft, nontender and nondistended. Normal bowel sounds, without guarding,  and without rebound.   Neurologic:  Alert and  oriented x4;  grossly normal neurologically.  Impression/Plan: Morgan [name] is here for an endoscopy to be performed for GERD and dysphagia  Risks, benefits, limitations, and alternatives regarding  endoscopy have been reviewed with the patient.  Questions have been answered.  All parties agreeable.   [name], MD  02/18/2023, 10:25 AM

## 2023-02-18 NOTE — Transfer of Care (Signed)
Immediate Anesthesia Transfer of Care Note  Patient: Morgan [name]  Procedure(s) Performed: ESOPHAGOGASTRODUODENOSCOPY (EGD) WITH PROPOFOL Balloon dilation wire-guided  Patient Location: PACU  Anesthesia Type:General  Level of Consciousness: awake, alert , and oriented  Airway & Oxygen Therapy: Patient Spontanous Breathing  Post-op Assessment: Report given to RN and Post -op Vital signs reviewed and stable  Post vital signs: Reviewed and stable  Last Vitals:  Vitals Value Taken Time  BP 92/63 02/18/23 1045  Temp 36.3 C 02/18/23 1044  Pulse 84 02/18/23 1049  Resp 17 02/18/23 1044  SpO2 99 % 02/18/23 1049  Vitals shown include unfiled device data.  Last Pain:  Vitals:   02/18/23 1044  TempSrc: Temporal  PainSc: Asleep         Complications: No notable events documented.

## 2023-02-19 ENCOUNTER — Ambulatory Visit: Payer: BC Managed Care – PPO

## 2023-02-19 ENCOUNTER — Encounter: Payer: Self-pay | Admitting: Gastroenterology

## 2023-02-19 ENCOUNTER — Ambulatory Visit
Admission: RE | Admit: 2023-02-19 | Discharge: 2023-02-19 | Disposition: A | Discharge status: Home / Self Care | Payer: BC Managed Care – PPO | Attending: Gastroenterology | Admitting: Gastroenterology

## 2023-02-19 ENCOUNTER — Other Ambulatory Visit: Payer: Self-pay | Admitting: Nurse Practitioner

## 2023-02-19 ENCOUNTER — Encounter
Admission: RE | Disposition: A | Discharge status: Home / Self Care | Payer: Self-pay | Source: Home / Self Care | Attending: Gastroenterology

## 2023-02-19 DIAGNOSIS — I10 Essential (primary) hypertension: Secondary | ICD-10-CM | POA: Diagnosis not present

## 2023-02-19 DIAGNOSIS — G43909 Migraine, unspecified, not intractable, without status migrainosus: Secondary | ICD-10-CM | POA: Diagnosis not present

## 2023-02-19 DIAGNOSIS — E119 Type 2 diabetes mellitus without complications: Secondary | ICD-10-CM | POA: Insufficient documentation

## 2023-02-19 DIAGNOSIS — K449 Diaphragmatic hernia without obstruction or gangrene: Secondary | ICD-10-CM | POA: Insufficient documentation

## 2023-02-19 DIAGNOSIS — E782 Mixed hyperlipidemia: Secondary | ICD-10-CM

## 2023-02-19 DIAGNOSIS — Z87891 Personal history of nicotine dependence: Secondary | ICD-10-CM | POA: Insufficient documentation

## 2023-02-19 DIAGNOSIS — R935 Abnormal findings on diagnostic imaging of other abdominal regions, including retroperitoneum: Secondary | ICD-10-CM | POA: Diagnosis not present

## 2023-02-19 DIAGNOSIS — K219 Gastro-esophageal reflux disease without esophagitis: Secondary | ICD-10-CM | POA: Diagnosis not present

## 2023-02-19 DIAGNOSIS — Z79899 Other long term (current) drug therapy: Secondary | ICD-10-CM | POA: Diagnosis not present

## 2023-02-19 DIAGNOSIS — R933 Abnormal findings on diagnostic imaging of other parts of digestive tract: Secondary | ICD-10-CM | POA: Diagnosis not present

## 2023-02-19 HISTORY — PX: COLONOSCOPY WITH PROPOFOL: SHX5780

## 2023-02-19 HISTORY — PX: BIOPSY: SHX5522

## 2023-02-19 LAB — GLUCOSE, CAPILLARY: Glucose-Capillary: 105 mg/dL — ABNORMAL HIGH (ref 70–99)

## 2023-02-19 SURGERY — COLONOSCOPY WITH PROPOFOL
Anesthesia: General

## 2023-02-19 MED ORDER — LIDOCAINE HCL (PF) 2 % IJ SOLN
INTRAMUSCULAR | Status: DC | PRN
  Administered 2023-02-19: 60 mg via INTRADERMAL

## 2023-02-19 MED ORDER — SODIUM CHLORIDE 0.9 % IV SOLN
INTRAVENOUS | Status: DC
  Administered 2023-02-19: 20 mL/h via INTRAVENOUS

## 2023-02-19 MED ORDER — LIDOCAINE HCL (PF) 2 % IJ SOLN
INTRAMUSCULAR | Status: AC
  Filled 2023-02-19: qty 5

## 2023-02-19 MED ORDER — PROPOFOL 500 MG/50ML IV EMUL
INTRAVENOUS | Status: DC | PRN
  Administered 2023-02-19: 200 ug/kg/min via INTRAVENOUS
  Administered 2023-02-19: 70 mg via INTRAVENOUS

## 2023-02-19 MED ORDER — PROPOFOL 1000 MG/100ML IV EMUL
INTRAVENOUS | Status: AC
  Filled 2023-02-19: qty 100

## 2023-02-19 NOTE — Transfer of Care (Signed)
Immediate Anesthesia Transfer of Care Note  Patient: Morgan [name]  Procedure(s) Performed: COLONOSCOPY WITH PROPOFOL BIOPSY  Patient Location: PACU  Anesthesia Type:General  Level of Consciousness: awake  Airway & Oxygen Therapy: Patient Spontanous Breathing  Post-op Assessment: Report given to RN and Post -op Vital signs reviewed and stable  Post vital signs: Reviewed and stable  Last Vitals:  Vitals Value Taken Time  BP 98/64 02/19/23 0818  Temp    Pulse 82 02/19/23 0819  Resp 21 02/19/23 0819  SpO2 100 % 02/19/23 0819  Vitals shown include unfiled device data.  Last Pain:  Vitals:   02/19/23 0818  TempSrc:   PainSc: Asleep         Complications: There were no known notable events for this encounter.

## 2023-02-19 NOTE — H&P (Signed)
Wyline Mood, MD [address], [address], [place], [place], [address] [address], [place], [place], [address] Phone: [phone]  Fax: [phone]  Primary Care Physician:  Eden Emms, NP   Pre-Procedure History & Physical: HPI:  Morgan [name] is a 47 y.o. female is here for an colonoscopy.   Past Medical History:  Diagnosis Date   Complication of anesthesia    STATES TOLD REINTUBATION WITH HYSTERECTOMY   Controlled diabetes mellitus type 2 with complications (HCC) 12/31/2021   Crohn's disease (HCC) 2024   Crohn's disease (HCC)    Difficult intubation    Pt unclear of what difficulty was.  Reports it was with 2010 back surgery and with hemi-thyroidectomy.  No problems in other surgeries.   GERD (gastroesophageal reflux disease)    Headache    MIGRAINES - improved with Ajovy   History of hiatal hernia    Hypertension    PONV (postoperative nausea and vomiting)    Stiff neck    mild limitations of up/down mvmt s/p neck surgery    Past Surgical History:  Procedure Laterality Date   ABDOMINAL HYSTERECTOMY     BACK SURGERY  03/2020   lower back   BALLOON DILATION N/A 04/18/2018   Procedure: BALLOON DILATION;  Surgeon: [name], MD;  Location: [facility];  Service: Endoscopy;  Laterality: N/A;   COLONOSCOPY WITH PROPOFOL N/A 04/04/2021   Procedure: COLONOSCOPY WITH BIOPSY;  Surgeon: [name], MD;  Location: [facility];  Service: Endoscopy;  Laterality: N/A;  Latex   ESOPHAGOGASTRODUODENOSCOPY (EGD) WITH PROPOFOL N/A 04/18/2018   Procedure: ESOPHAGOGASTRODUODENOSCOPY (EGD) WITH BIOPSIES;  Surgeon: [name], MD;  Location: [facility];  Service: Endoscopy;  Laterality: N/A;   ESOPHAGOGASTRODUODENOSCOPY (EGD) WITH PROPOFOL N/A 02/18/2023   Procedure: ESOPHAGOGASTRODUODENOSCOPY (EGD) WITH PROPOFOL;  Surgeon: [name], MD;  Location: [facility];  Service: Endoscopy;  Laterality: N/A;   NECK SURGERY     FUSION  C5-C7   OOPHORECTOMY     THYROIDECTOMY Left    (Hemi-)   TRACHELECTOMY N/A 10/15/2017   Procedure: TRACHELECTOMY;  Surgeon: Schermerhorn, Ihor Austin, MD;  Location: [facility];  Service: Gynecology;  Laterality: N/A;   TUBAL LIGATION      Prior to Admission medications   Medication Sig Start Date End Date Taking? Authorizing Provider  AJOVY 225 MG/1.5ML SOAJ Inject into the skin. 03/10/21  Yes [provider]  atorvastatin (LIPITOR) 10 MG tablet TAKE 1 TABLET BY MOUTH DAILY 01/07/23  Yes Eden Emms, NP  benazepril (LOTENSIN) 10 MG tablet Take 1 tablet (10 mg total) by mouth daily. 10/08/22  Yes Dugal, Wyatt Mage, FNP  clonazePAM (KLONOPIN) 0.5 MG tablet Take 0.25 mg by mouth 2 (two) times daily. 02/09/23  Yes [provider]  cyclobenzaprine (FLEXERIL) 10 MG tablet TAKE 2 TABLETS BY MOUTH AT BEDTIME 10/07/22  Yes Eden Emms, NP  dicyclomine (BENTYL) 10 MG capsule Take 1 capsule (10 mg total) by mouth 3 (three) times daily before meals. 12/08/22  Yes [name], MD  gabapentin (NEURONTIN) 400 MG capsule Take 400 mg by mouth. Take 400 mg in the morning and 800 mg at night 08/13/22  Yes [provider]  nortriptyline (PAMELOR) 10 MG capsule Take 10 mg by mouth every morning.   Yes [provider]  nortriptyline (PAMELOR) 50 MG capsule Take 50 mg by mouth at bedtime.   Yes [provider]  omeprazole (PRILOSEC) 40 MG capsule TAKE 1 CAPSULE BY MOUTH  TWICE A DAY 12/07/22  Yes Eden Emms, NP  QUEtiapine (SEROQUEL) 100 MG tablet Take 100 mg by mouth at bedtime.   Yes [provider]  valACYclovir (VALTREX) 500 MG tablet TAKE 1 TABLET (500 MG TOTAL) BY MOUTH DAILY. 10/07/22  Yes Eden Emms, NP  rizatriptan (MAXALT) 10 MG tablet Take by mouth. 1 tablet by mouth as directed 01/02/21 01/02/22  [provider]    Allergies as of 02/16/2023 - Review Complete 02/16/2023  Allergen Reaction Noted   Adhesive [tape] Other (See Comments)  09/24/2017   Latex Other (See Comments) 09/24/2017   Lexapro [escitalopram oxalate] Other (See Comments) 10/15/2016   Metoprolol Other (See Comments) 10/29/2016   Topamax [topiramate]  10/15/2016    Family History  Problem Relation Age of Onset   Hypertension Mother    Arthritis Mother    Lung disease Mother    Arthritis Father    Stroke Father    Hypertension Father    Arthritis Maternal Grandmother    Heart disease Maternal Grandmother    Hypertension Maternal Grandmother    Arthritis Maternal Grandfather    Lung cancer Maternal Grandfather    Heart disease Maternal Grandfather    Arthritis Paternal Grandmother    Heart disease Paternal Grandmother    Hypertension Paternal Grandmother    Arthritis Paternal Grandfather    Heart disease Paternal Grandfather    Stroke Paternal Grandfather    Heart disease Maternal Uncle     Social History   Socioeconomic History   Marital status: Married    Spouse name: Not on file   Number of children: Not on file   Years of education: Not on file   Highest education level: Not on file  Occupational History   Not on file  Tobacco Use   Smoking status: Former    Current packs/day: 0.00    Average packs/day: 2.0 packs/day for 25.0 years (50.0 ttl pk-yrs)    Types: Cigarettes    Start date: 10/01/1991    Quit date: 09/30/2016    Years since quitting: 6.3   Smokeless tobacco: Never  Vaping Use   Vaping status: Never Used  Substance and Sexual Activity   Alcohol use: Not Currently    Comment: occasional   Drug use: No   Sexual activity: Yes  Other Topics Concern   Not on file  Social History Narrative   Not on file   Social Determinants of Health   Financial Resource Strain: Not on file  Food Insecurity: No Food Insecurity (01/18/2023)   Received from [facility]   Hunger Vital Sign    Worried About Running Out of Food in the Last Year: Never true    Ran Out of Food in the Last Year: Never true   Transportation Needs: No Transportation Needs (01/18/2023)   Received from [facility] - Transportation    In the past 12 months, has lack of transportation kept you from medical appointments or from getting medications?: No    Lack of Transportation (Non-Medical): No  Physical Activity: Not on file  Stress: Not on file  Social Connections: Not on file  Intimate Partner Violence: Not on file    Review of Systems: See HPI, otherwise negative ROS  Physical Exam: BP 127/76   Pulse 94   Temp (!) 96.6 F (35.9 C) (Temporal)   Resp 20   Ht 5\' 5"  (1.651 m)   Wt 73.5 kg   SpO2 100%  BMI 26.96 kg/m  General:   Alert,  pleasant and cooperative in NAD Head:  Normocephalic and atraumatic. Neck:  Supple; no masses or thyromegaly. Lungs:  Clear throughout to auscultation, normal respiratory effort.    Heart:  +S1, +S2, Regular rate and rhythm, No edema. Abdomen:  Soft, nontender and nondistended. Normal bowel sounds, without guarding, and without rebound.   Neurologic:  Alert and  oriented x4;  grossly normal neurologically.  Impression/Plan: Morgan [name] is here for an colonoscopy to be performed for evaluation of crohns  disease  Risks, benefits, limitations, and alternatives regarding  colonoscopy have been reviewed with the patient.  Questions have been answered.  All parties agreeable.   Wyline Mood, MD  02/19/2023, 7:50 AM

## 2023-02-19 NOTE — Anesthesia Preprocedure Evaluation (Signed)
Anesthesia Evaluation  Patient identified by MRN, date of birth, ID band Patient awake    Reviewed: Allergy & Precautions, NPO status , Patient's Chart, lab work & pertinent test results  History of Anesthesia Complications (+) DIFFICULT AIRWAY and history of anesthetic complications  Airway Mallampati: III  TM Distance: <3 FB Neck ROM: Full    Dental  (+) Teeth Intact   Pulmonary neg pulmonary ROS, Patient abstained from smoking., former smoker   Pulmonary exam normal breath sounds clear to auscultation       Cardiovascular Exercise Tolerance: Good hypertension, Pt. on medications Normal cardiovascular exam Rhythm:Regular Rate:Normal     Neuro/Psych  Headaches  negative psych ROS   GI/Hepatic Neg liver ROS, hiatal hernia,GERD  Medicated,,  Endo/Other  diabetes, Type 2    Renal/GU negative Renal ROS  negative genitourinary   Musculoskeletal   Abdominal Normal abdominal exam  (+)   Peds negative pediatric ROS (+)  Hematology negative hematology ROS (+)   Anesthesia Other Findings Past Medical History: No date: Complication of anesthesia     Comment:  STATES TOLD REINTUBATION WITH HYSTERECTOMY 12/31/2021: Controlled diabetes mellitus type 2 with complications  (HCC) 2024: Crohn's disease (HCC) No date: Crohn's disease (HCC) No date: Difficult intubation     Comment:  Pt unclear of what difficulty was.  Reports it was with               2010 back surgery and with hemi-thyroidectomy.  No               problems in other surgeries. No date: GERD (gastroesophageal reflux disease) No date: Headache     Comment:  MIGRAINES - improved with Ajovy No date: History of hiatal hernia No date: Hypertension No date: PONV (postoperative nausea and vomiting) No date: Stiff neck     Comment:  mild limitations of up/down mvmt s/p neck surgery  Past Surgical History: No date: ABDOMINAL HYSTERECTOMY 03/2020: BACK SURGERY      Comment:  lower back 04/18/2018: BALLOON DILATION; N/A     Comment:  Procedure: BALLOON DILATION;  Surgeon: [name], MD;              Location: [facility];  Service: Endoscopy;                Laterality: N/A; 04/04/2021: COLONOSCOPY WITH PROPOFOL; N/A     Comment:  Procedure: COLONOSCOPY WITH BIOPSY;  Surgeon: [name], MD;  Location: [facility];  Service:               Endoscopy;  Laterality: N/A;  Latex 04/18/2018: ESOPHAGOGASTRODUODENOSCOPY (EGD) WITH PROPOFOL; N/A     Comment:  Procedure: ESOPHAGOGASTRODUODENOSCOPY (EGD) WITH               BIOPSIES;  Surgeon: [name], MD;  Location: [facility];  Service: Endoscopy;  Laterality: N/A; No date: NECK SURGERY     Comment:  FUSION C5-C7 No date: OOPHORECTOMY No date: THYROIDECTOMY; Left     Comment:  (Hemi-) 10/15/2017: TRACHELECTOMY; N/A     Comment:  Procedure: TRACHELECTOMY;  Surgeon: Schermerhorn, Ihor Austin, MD;  Location: [facility];  Service: Gynecology;  Laterality: N/A; No date: TUBAL LIGATION     Reproductive/Obstetrics negative OB ROS                             Anesthesia Physical Anesthesia Plan  ASA: 2  Anesthesia Plan: General   Post-op Pain Management:    Induction: Intravenous  PONV Risk Score and Plan: Propofol infusion and TIVA  Airway Management Planned: Natural Airway and Nasal Cannula  Additional Equipment:   Intra-op Plan:   Post-operative Plan:   Informed Consent: I have reviewed the patients History and Physical, chart, labs and discussed the procedure including the risks, benefits and alternatives for the proposed anesthesia with the patient or authorized representative who has indicated his/her understanding and acceptance.     Dental Advisory Given  Plan Discussed with: CRNA and Surgeon  Anesthesia Plan Comments:        Anesthesia Quick Evaluation

## 2023-02-19 NOTE — Anesthesia Postprocedure Evaluation (Signed)
Anesthesia Post Note  Patient: Morgan [name]  Procedure(s) Performed: COLONOSCOPY WITH PROPOFOL BIOPSY  Patient location during evaluation: Endoscopy Anesthesia Type: General Level of consciousness: awake and alert Pain management: pain level controlled Vital Signs Assessment: post-procedure vital signs reviewed and stable Respiratory status: spontaneous breathing, nonlabored ventilation and respiratory function stable Cardiovascular status: blood pressure returned to baseline and stable Postop Assessment: no apparent nausea or vomiting Anesthetic complications: no   There were no known notable events for this encounter.   Last Vitals:  Vitals:   02/19/23 0818 02/19/23 0828  BP: 98/64 112/69  Pulse: 87 84  Resp: 18 15  Temp:    SpO2: 100% 100%    Last Pain:  Vitals:   02/19/23 0828  TempSrc:   PainSc: 0-No pain                 Foye Deer

## 2023-02-19 NOTE — Op Note (Signed)
[facility] Patient Name: Morgan [name] Procedure Date: 02/19/2023 7:04 AM MRN: [id number] Account #: [id number] Date of Birth: 1975-07-22 Admit Type: Outpatient Age: 47 Room: [facility] ENDO ROOM 4 Gender: Female Note Status: Finalized Instrument Name: Prentice Docker [id number] Procedure:             Colonoscopy Indications:           Abnormal MRI of the GI tract Providers:             Wyline Mood MD, MD Referring MD:          Genene Churn. Toney Reil (Referring MD) Medicines:             Monitored Anesthesia Care Complications:         No immediate complications. Procedure:             Pre-Anesthesia Assessment:                        - Prior to the procedure, a History and Physical was                         performed, and patient medications, allergies and                         sensitivities were reviewed. The patient's tolerance                         of previous anesthesia was reviewed.                        - The risks and benefits of the procedure and the                         sedation options and risks were discussed with the                         patient. All questions were answered and informed                         consent was obtained.                        - ASA Grade Assessment: II - A patient with mild                         systemic disease.                        After obtaining informed consent, the colonoscope was                         passed under direct vision. Throughout the procedure,                         the patient's blood pressure, pulse, and oxygen                         saturations were monitored continuously. The                         Colonoscope was  introduced through the anus and                         advanced to the the terminal ileum. The colonoscopy                         was performed with ease. The patient tolerated the                         procedure well. The quality of the bowel preparation                          was excellent. The ileocecal valve, appendiceal                         orifice, and rectum were photographed. Findings:      The terminal ileum appeared normal. Biopsies were taken with a cold       forceps for histology.      The exam was otherwise without abnormality on direct and retroflexion       views. Impression:            - The examined portion of the ileum was normal.                         Biopsied.                        - The examination was otherwise normal on direct and                         retroflexion views. Recommendation:        - Discharge patient to home (with escort).                        - Resume previous diet.                        - Continue present medications.                        - Await pathology results.                        - Repeat colonoscopy in 10 years for screening                         purposes.                        - Return to GI office as previously scheduled. Procedure Code(s):     --- Professional ---                        [phone], Colonoscopy, flexible; with biopsy, single or                         multiple Diagnosis Code(s):     --- Professional ---                        R93.3, Abnormal findings on diagnostic imaging of  other parts of digestive tract CPT copyright 2022 [employer]. All rights reserved. The codes documented in this report are preliminary and upon coder review may  be revised to meet current compliance requirements. Wyline Mood, MD Wyline Mood MD, MD 02/19/2023 8:15:07 AM This report has been signed electronically. Number of Addenda: 0 Note Initiated On: 02/19/2023 7:04 AM Scope Withdrawal Time: 0 hours 10 minutes 10 seconds  Total Procedure Duration: 0 hours 16 minutes 1 second  Estimated Blood Loss:  Estimated blood loss: none.      [facility]

## 2023-02-21 DIAGNOSIS — R935 Abnormal findings on diagnostic imaging of other abdominal regions, including retroperitoneum: Secondary | ICD-10-CM | POA: Diagnosis not present

## 2023-02-22 ENCOUNTER — Ambulatory Visit: Payer: BC Managed Care – PPO | Admitting: Surgery

## 2023-02-22 ENCOUNTER — Telehealth: Payer: Self-pay | Admitting: Surgery

## 2023-02-22 ENCOUNTER — Encounter: Payer: Self-pay | Admitting: Surgery

## 2023-02-22 VITALS — BP 131/83 | HR 91 | Temp 98.7°F | Ht 65.0 in | Wt 161.6 lb

## 2023-02-22 DIAGNOSIS — K449 Diaphragmatic hernia without obstruction or gangrene: Secondary | ICD-10-CM | POA: Diagnosis not present

## 2023-02-22 LAB — SURGICAL PATHOLOGY

## 2023-02-22 NOTE — Telephone Encounter (Signed)
Patient has been advised of Pre-Admission date/time, and Surgery date at [facility].  Surgery Date: 03/23/23 Preadmission Testing Date: 03/16/23 (phone 1p-4p)  Patient has been made aware to call [phone], between 1-3:00pm the day before surgery, to find out what time to arrive for surgery.

## 2023-02-22 NOTE — Patient Instructions (Signed)
We have spoken today about repairing your Hiatal Hernia. Your surgery will be scheduled at [facility] with Dr. Everlene Farrier.  Plan to be in the hospital for 1-2 days if the minimally invasive surgery is completed without having to make a bigger incision. If the bigger incision is made, you will most likely need to be in the hospital 4-6 days. You will be on a soft diet and need to recover for 2 weeks following your surgery prior to doing any of your normal activities. At the 2 week mark, we will see you in the office and if you are doing ok we will advance your diet and activity level as you tolerate.  Please see your Blue (Pre-care) Sheet for more information regarding your surgery. Our surgery scheduler will call you to verify surgery date and to go over information  You will need to arrange to be out of work for approximately 1-2 weeks and then you may return with a lifting restriction for 4 more weeks. If you have FMLA or Disability paperwork that needs to be filled out, please have your company fax your paperwork to [phone] or you may drop this by either office. This paperwork will be filled out within 3 days after your surgery has been completed.  Please call our office with any questions or concerns that you have regarding your surgery and recovery.    Laparoscopic Nissen Fundoplication Laparoscopic Nissen fundoplication is surgery to relieve heartburn and other problems caused by gastric fluids flowing up into your esophagus. The esophagus is the tube that carries food and liquid from your throat to your stomach. Normally, the muscle that sits between your stomach and your esophagus (lower esophageal sphincter or LES) keeps stomach fluids in your stomach. In some people, the LES does not work properly, and stomach fluids flow up into the esophagus. This can happen when part of the stomach bulges through the LES (hiatal hernia). The backward flow of stomach fluids can cause a type of severe and  long-standing heartburn that is called gastroesophageal reflux disease (GERD). You may need this surgery if other treatments for GERD have not helped. In a laparoscopic Nissen fundoplication, the upper part of your stomach is wrapped around the lower part of your esophagus to strengthen the LES and prevent reflux. If you have a hiatal hernia, it will also be repaired with this surgery. The procedure is done through several small incisions in your abdomen. It is performed using a thin, telescopic instrument (laparoscope) and other instruments that can pass through the scope or through other small incisions. Tell a health care provider about: Any allergies you have. All medicines you are taking, including vitamins, herbs, eye drops, creams, and over-the-counter medicines. Any problems you or family members have had with anesthetic medicines. Any blood disorders you have. Any surgeries you have had. Any medical conditions you have. What are the risks? Generally, this is a safe procedure. However, problems may occur, including: Difficulty swallowing (dysphagia). Bloating. Nausea or vomiting. Damage to the lung, causing a collapsed lung. Infection or bleeding. What happens before the procedure? Ask your health care provider about: Changing or stopping your regular medicines. This is especially important if you are taking diabetes medicines or blood thinners. Taking medicines such as aspirin and ibuprofen. These medicines can thin your blood. Do not take these medicines before your procedure if your health care provider asks you not to. Follow your health care provider's instructions about eating or drinking restrictions. Plan to have someone take  you home after the procedure. What happens during the procedure? An IV tube will be inserted into one of your veins. It will be used to give you fluids and medicines during the procedure. You will be given a medicine that makes you fall asleep (general  anesthetic). Your abdomen will be cleaned with a germ-killing solution (antiseptic). The surgeon will make a small incision in your abdomen and insert a tube through the incision. Your abdomen will be filled with a gas. This helps the surgeon to see your organs more easily and it makes more space to work. The surgeon will insert the laparoscope through the incision. The scope has a camera that will send pictures to a monitor in the operating room. The surgeon will make several other small incisions in your abdomen to insert the other instruments that are needed during the procedure. Another instrument (dilator) will be passed through your mouth and down your esophagus into the upper part of your stomach. The dilator will prevent your LES from being closed too tightly during surgery. The surgeon will pass the top portion of your stomach behind the lower part of your esophagus and wrap it all the way around. This will be stitched into place. If you have a hiatal hernia, it will be repaired during this procedure. All instruments will be removed, and your incisions will be closed under your skin with stitches (sutures). Skin adhesive strips may also be used. A bandage (dressing) will be placed on your skin over the incisions. The procedure may vary among health care providers and hospitals. What happens after the procedure? You will be moved to a recovery area. Your blood pressure, heart rate, breathing rate, and blood oxygen level will be monitored often until the medicines you were given have worn off. You will be given pain medicine as needed. Your IV tube will be kept in until you are able to drink fluids. This information is not intended to replace advice given to you by your health care provider. Make sure you discuss any questions you have with your health care provider. Document Released: 04/27/2014 Document Revised: 09/12/2015 Document Reviewed: 12/06/2013 Elsevier Interactive Patient  Education  2017 [employer]   Laparoscopic Nissen Fundoplication, Care After Refer to this sheet in the next few weeks. These instructions provide you with information about caring for yourself after your procedure. Your health care provider may also give you more specific instructions. Your treatment has been planned according to current medical practices, but problems sometimes occur. Call your health care provider if you have any problems or questions after your procedure. What can I expect after the procedure? After the procedure, it is common to have: Difficulty swallowing (dysphagia). Excess gas (bloating). Follow these instructions at home: Medicines  Take medicines only as directed by your health care provider. Do not drive or operate heavy machinery while taking pain medicine. Incision care  There are many different ways to close and cover an incision, including stitches (sutures), skin glue, and adhesive strips. Follow your health care provider's instructions about: Incision care. Bandage (dressing) changes and removal. Incision closure removal. Check your incision areas every day for signs of infection. Watch for: Redness, swelling, or pain. Fluid, blood, or pus. Do not take baths, swim, or use a hot tub until your health care provider approves. Take showers as directed by your health care provider. Eating and drinking  Follow your health care provider's instructions about eating. You may need to follow a very soft diet for 2  weeks, followed by a diet of more regular foods for 2 weeks, no breads. You should return to your usual diet gradually. Drink enough fluid to keep your urine clear or pale yellow. Activity  Return to your normal activities as directed by your health care provider. Ask your health care provider what activities are safe for you. Avoid strenuous exercise. Do not lift anything that is heavier than 10 lb (4.5 kg). Ask your health care provider when you  can: Return to sexual activity. Drive. Go back to work. Contact a health care provider if: You have a fever. Your pain gets worse or is not helped by medicine. You have frequent nausea or vomiting. You have continued abdominal bloating. You have an ongoing (persistent) cough. You have redness, swelling, or pain in any incision areas. You have fluid, blood, or pus coming from any incisions. Get help right away if: You have trouble breathing. You are unable to swallow. You have persistent vomiting. You have blood in your vomit. You have severe abdominal pain. This information is not intended to replace advice given to you by your health care provider. Make sure you discuss any questions you have with your health care provider. Document Released: 11/28/2003 Document Revised: 09/12/2015 Document Reviewed: 12/06/2013 Elsevier Interactive Patient Education  2017 Else[employer] Inc.   Diet After Nissen Fundoplication Surgery This diet information is for patients who have recently had Nissen fundoplication surgery to correct reflux disease or to repair various types of hernias, such as hiatal hernia and intrathoracic stomach. This diet may also be used for other gastrointestinal surgeries, such as Heller myotomy and repair of achalasia. The diet will help control diarrhea, excess gas and swallowing problems, which may occur after this type of surgery. Keeping Your Stomach from Stretching Eat small, frequent meals (six to eight per day). This will help you consume the majority of the nutrients you need without causing your stomach to feel full or distended.  Drinking large amounts of fluids with meals can stretch your stomach. You may drink fluids between meals as often as you like, but limit fluids to 1/2 cup (4 fluid ounces) with meals and one cup (8 fluid ounces) with snacks.  Sit upright while eating and stay upright for 30 minutes after each meal. Gravity can help food move through your digestive  tract. Do not lie down after eating. Sit upright for 2 hours after your last meal or snack of the day.  Eat very slowly. Take your time when eating.  Take small bites and chew your food well to help aid in swallowing and digestion.  Avoid crusty breads and sticky, gummy foods, such as bananas, fresh doughy breads, rolls and doughnuts. These types of foods become sticky and difficult to swallow.  Toasted breads tend to be better tolerated.  Lastly, if you eat sweets, consume them at the end of your meal to avoid a group of symptoms referred to as "dumping syndrome". This describes the rapid emptying of foods from the stomach to the small intestine. Sweetened beverages, candy and desserts move more rapidly and dump quickly into the intestines. This can cause symptoms of nausea, weakness, cold sweats, cramps, diarrhea and dizzy spells.  Avoiding Gas Avoid drinking through a straw. Do not chew gum or tobacco. These actions cause you to swallow air, which produces excess gas in your stomach. Chew with your mouth closed.  Avoid any foods that cause stomach gas and distention. These foods include corn, dried beans, peas, lentils, onions, broccoli, cauliflower  and any food from the cabbage family.  Avoid carbonated drinks, alcohol, citrus and tomato products.  When will I be able to eat a soft diet? After Nissen fundoplication surgery, your diet will be advanced slowly by your surgeon. Generally, you will be on a clear liquid diet for the first few meals. Then you will advance to the full liquid diet for a meal or two and eventually to a Nissen soft diet. Please be aware that each patient's tolerance to food is different. Your doctor will advance your diet depending on how well you progress after surgery. Clear Liquid Diet  The first diet after surgery is the clear liquid diet. It includes the following liquids: Apple juice  Cranberry juice  Grape juice  Chicken broth  Beef broth  Flavored gelatin  (Jell-O)  Decaf tea and coffee  Caffeinated beverages are permitted based on tolerance  Popsicles  [place] ice Carbonated drinks (sodas) are not allowed for the first six to eight weeks after surgery. After this time you can try them again in small amounts.  Full Liquid Diet The full liquid diet contains anything on the clear liquid diet, plus: Milk, soy, rice and almond (no chocolate)  Cream of wheat, cream of rice, grits  Strained creamed soups (no tomato or broccoli)  Vanilla and strawberry-flavored ice cream  Sherbet  Blended, custard styled or whipped yogurt (plain or vanilla only)  Vanilla and butterscotch pudding (no chocolate or coconut)  Nutritional drinks including Ensure, Boost, Carnation Instant Breakfast (no chocolate-flavored) Note: Dairy products, such as milk, ice cream and pudding, may cause diarrhea in some people just after surgery. You may need to avoid milk products. If so, substitute them with lactose-free beverages, such as soy, rice, Lactaid or almond milks.  Nissen Soft Diet Food Category Foods to Choose Foods to Avoid  Beverages Milk, such as, whole, 2%, 1%, non-fat, or skim, soy, rice, almond  Caffeinated and decaf tea and coffee  Powdered drink mixes (in moderation)  Non-citrus juices (apple, grape, cranberry or blends of these)  Fruit nectars  Nutritional drinks including Boost, Ensure, Carnation Instant Breakfast Chocolate milk, cocoa or other chocolate-flavored drinks  Carbonated drinks  Alcohol  Citrus juices like orange, grapefruit, lemon and lime  Breads Crackers (saltine, butter, soda, graham, Goldfish and Cheese Nips)   Untoasted bread, bagels, [place] and hard rolls, English muffins  Crackers with nuts, seeds, fresh or dried fruit, coconut, or highly seasoned, such as garlic or onion-flavored  Sweet rolls, coffee cake or doughnuts  Cereals Well cooked cereals, such as oatmeal (plain or flavored)  Cold cereal (Cornflakes, Rice  Krispies, Cheerios, Special K plain, Rice Chex and puffed rice) Very coarse cereal, such as bran, shredded wheat  Any cereal with fresh or dried fruit, coconut, seeds or nuts  Desserts Eat in moderation and do not eat desserts or sweets by themselves. Plain cakes, cookies and cream-filled pies  Vanilla and butterscotch pudding or custard  Ice cream, ice milk, frozen yogurt and sherbet  Gelatin made from allowed foods  Fruit ices and popsicles Desserts containing chocolate, coconut, nuts, seeds, fresh or dried fruit, peppermint or spearmint  Eggs  Poached, hard boiled or scrambled Fried eggs and highly seasoned eggs (deviled eggs)  Fats Eat in moderation. Butter and margarine  Mayonnaise and vegetable oils  Mildly seasoned cream sauces and gravies  Plain cream cheese  Sour cream Highly seasoned salad dressings, cream sauces and gravies  Bacon, bacon fat, ham fat, lard and salt pork  Foy [place]  foods  Nuts  Fruits Fruit juice  Any canned or cooked fruit except those listed in the AVOID column ALL fresh fruits, such as citrus, apples, and pineapple  Canned pineapple  Dried fruits, such as raisins, berries  Fruits with seeds, such as berries, kiwi and figs  Meat, Fish, Poultry, and [employer] may be ground, minced or chopped to ease swallowing and digestion  Tender, well cooked and moist cuts of beef, chicken, [place] and pork  Veal and lamb  Flaky, cooked fish  Canned tuna  Cottage and ricotta cheeses  Mild cheese, such as American, brick, mozzarella and baby Swiss  Creamy peanut butter  Plain custard or blended fruit yogurt  Moist casseroles, such as macaroni & cheese, tuna noodle  Grilled or toasted cheese sandwich Tough meats with a lot of gristle  Fried, highly seasoned, smoked and fatty meat, fish or poultry, such as frankfurters, luncheon meats, sausage, bacon, spare ribs, beef brisket, sardines, anchovies, duck and goose  Chili and other entrees made with pepper or  chili pepper  Shellfish  Strongly flavored cheeses, such as sharp cheese, extra sharp cheddar, cheese containing peppers or other seasonings  Crunchy peanut butter  Any yogurt with nuts, seeds, coconut, strawberries or raspberries  Potatoes and Starches Peeled, mashed or boiled white or sweet potatoes  Oven-baked potatoes without skin  Well cooked white rice, enriched noodles, barley, spaghetti, macaroni and other pastas Fried potatoes, potato skins and potato chips  Hard and soft taco shells  Fried, brown or wild rice  Soups Mildly flavored meat stocks  Cream soups made from allowed foods Highly seasoned soups and tomato based soups, cream soups made with gas producing vegetables, such as broccoli, cauliflower, onion, etc.  Sweets and Snacks Use in moderation and do not eat large amounts of sweets by themselves. Syrup, honey, jelly and seedless jam  Plain hard candies and plain candies made with allowed ingredients  Molasses  Marshmallows  Other candy made from allowed ingredients  Thin pretzels Jam, marmalade and preserves  Chocolate in any form  Any candy containing nuts, coconut, seeds, peppermint, spearmint or dried or fresh fruit  Popcorn, potato chips, tortilla chips  Soft or hard thick pretzels, such as sourdough  Vegetables Well cooked soft vegetables without seeds or skins, such as asparagus tips, beets, carrots, green and wax beans, chopped spinach, tender canned baby peas, squash and pumpkin Raw vegetables, tomatoes, tomato juice, tomato sauce and V-8 juice(tomato based products can irritate the stomach)  Gas producing vegetables, such as broccoli, Brussel sprouts, cabbage, cauliflower, onions, corn, cucumber, green peppers, rutabagas, turnips, radishes and sauerkraut  Dried beans, peas and lentils  Miscellaneous Salt and spices in moderation  Mustard and vinegar in moderation Fried or highly seasoned foods  Coconut and seeds  Pickles and olives  Chili sauces, ketchup,  barbecue sauce, horseradish, black pepper, chili powder and onion and garlic seasonings  Any other strongly flavored seasoning, condiment, spice or herb not tolerated  Any food not tolerated

## 2023-02-24 ENCOUNTER — Telehealth: Payer: Self-pay

## 2023-02-24 ENCOUNTER — Encounter: Payer: Self-pay | Admitting: Gastroenterology

## 2023-02-24 NOTE — Telephone Encounter (Signed)
Patient called and verbalized for Disability/STD/FMLA to be sent to [employer]: [phone] (fax).  Patient verbalized claim# will need to be at the top of each document. Claim#: [id number].

## 2023-02-25 ENCOUNTER — Encounter: Payer: Self-pay | Admitting: Surgery

## 2023-02-25 NOTE — Progress Notes (Signed)
Outpatient Surgical Follow Up   Morgan [name] is an 47 y.o. female.   Chief Complaint  Patient presents with   Follow-up    Hiatal hernia    HPI:  Morgan [name] is a 47 y.o. female seen in consultation for hiatal hernia.  She does have a chronic history of functional GI issues since 2020 with intermittent abdominal discomfort.  She reports most of her pain is to the right side it is dull intermittent and mild to moderate intensity.  The patient did have an appointment and visit with Dr.Wohl from gastroenterology, she also had a repeat EGD that I have pers reviewed showing moderate paraesophageal. Barium S. Pers reviewed showing moderate HH w reflux Recent colonoscopy was nml She did have a recent MRI that I have personally reviewed showing evidence of a moderate hiatal hernia with at least a third of the stomach within the mediastinum.  She had a segment of the terminal ileum that appeared some tethering without active inflammatory findings She does experience some intermittent reflux symptoms that he seems to be partially relieved by PPI.  She is interested in pursuing surgical options for repair of reflux and hiatal hernia  Surgical history includes hysterectomy, tubal ligation and partial thyroidectomy.  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  Reports it was with 2010 back surgery and with hemi-thyroidectomy.  No problems in other surgeries.   GERD (gastroesophageal reflux disease)    Headache    MIGRAINES - improved with Ajovy   History of hiatal hernia    Hypertension    PONV (postoperative nausea and vomiting)    Stiff neck    mild limitations of  up/down mvmt s/p neck surgery    Past Surgical History:  Procedure Laterality Date   ABDOMINAL HYSTERECTOMY     BACK SURGERY  03/2020   lower back   BALLOON DILATION N/A 04/18/2018   Procedure: BALLOON DILATION;  Surgeon: [name], MD;  Location: [facility];  Service: Endoscopy;  Laterality: N/A;   BIOPSY  02/19/2023   Procedure: BIOPSY;  Surgeon: Wyline Mood, MD;  Location: [facility] ENDOSCOPY;  Service: Gastroenterology;;   COLONOSCOPY WITH PROPOFOL N/A 04/04/2021   Procedure: COLONOSCOPY WITH BIOPSY;  Surgeon: [name], MD;  Location: [facility];  Service: Endoscopy;  Laterality: N/A;  Latex   COLONOSCOPY WITH PROPOFOL N/A 02/19/2023   Procedure: COLONOSCOPY WITH PROPOFOL;  Surgeon: Wyline Mood, MD;  Location: [facility] END[facility]COPY;  Service: Gastroenterology;  Laterality: N/A;   ESOPHAGOGASTRODUODENOSCOPY (EGD) WITH PROPOFOL N/A 04/18/2018   Procedure: ESOPHAGOGASTRODUODENOSCOPY (EGD) WITH BIOPSIES;  Surgeon: [name], MD;  Location: [facility];  Service: Endoscopy;  Laterality: N/A;   ESOPHAGOGASTRODUODENOSCOPY (EGD) WITH PROPOFOL N/A 02/18/2023   Procedure: ESOPHAGOGASTRODUODENOSCOPY (EGD) WITH PROPOFOL;  Surgeon: [name], MD;  Location: [facility] ENDOSCOPY;  Service: Endoscopy;  Laterality: N/A;   NECK SURGERY     FUSION C5-C7   OOPHORECTOMY     THYROIDECTOMY Left    (Hemi-)   TRACHELECTOMY N/A 10/15/2017   Procedure: TRACHELECTOMY;  Surgeon: Schermerhorn, Ihor Austin, MD;  Location: [facility] ORS;  Service: Gynecology;  Laterality: N/A;   TUBAL LIGATION  Family History  Problem Relation Age of Onset   Hypertension Mother    Arthritis Mother    Lung disease Mother    Arthritis Father    Stroke Father    Hypertension Father    Arthritis Maternal Grandmother    Heart disease Maternal Grandmother    Hypertension Maternal Grandmother    Arthritis Maternal Grandfather    Lung cancer Maternal Grandfather    Heart disease Maternal Grandfather     Arthritis Paternal Grandmother    Heart disease Paternal Grandmother    Hypertension Paternal Grandmother    Arthritis Paternal Grandfather    Heart disease Paternal Grandfather    Stroke Paternal Grandfather    Heart disease Maternal Uncle     Social History:  reports that she quit smoking about 6 years ago. Her smoking use included cigarettes. She started smoking about 31 years ago. She has a 50 pack-year smoking history. She has never used smokeless tobacco. She reports that she does not currently use alcohol. She reports that she does not use drugs.  Allergies:  Allergies  Allergen Reactions   Adhesive [Tape] Other (See Comments)    Bruises skin   Latex Other (See Comments)    Swelling (vaginal)   Lexapro [Escitalopram Oxalate] Other (See Comments)    Loss taste   Metoprolol Other (See Comments)    Lowers heart rate   Topamax [Topiramate]     Loss taste    Medications reviewed.    ROS Full ROS performed and is otherwise negative other than what is stated in HPI   BP 131/83 (BP Location: Right Arm, Patient Position: Sitting, Cuff Size: Small)   Pulse 91   Temp 98.7 F (37.1 C) (Oral)   Ht 5\' 5"  (1.651 m)   Wt 161 lb 9.6 oz (73.3 kg)   SpO2 99%   BMI 26.89 kg/m   Physical Exam CONSTITUTIONAL: NAD. EYES: Pupils are equal, round, and reactive to light, Sclera are non-icteric. EARS, NOSE, MOUTH AND THROAT: . The oral mucosa is pink and moist. Hearing is intact to voice. LYMPH NODES:  Lymph nodes in the neck are normal. Neck: goiter on the right RESPIRATORY:  Lungs are clear. There is normal respiratory effort, with equal breath sounds bilaterally, and without pathologic use of accessory muscles. CARDIOVASCULAR: Heart is regular without murmurs, gallops, or rubs. GI: The abdomen is  soft, nontender, and nondistended. There are no palpable masses. There is no hepatosplenomegaly. There are normal bowel sounds in all quadrants. GU: Rectal deferred.   MUSCULOSKELETAL:  Normal muscle strength and tone. No cyanosis or edema.   SKIN: Turgor is good and there are no pathologic skin lesions or ulcers. NEUROLOGIC: Motor and sensation is grossly normal. Cranial nerves are grossly intact. PSYCH:  Oriented to person, place and time. Affect is normal.   Assessment/Plan: 47 year old female with significant moderate paraesophageal hernia type III in addition with some chronic functional GI issues that they cannot rule out Crohn's disease.    Discussed with her in detail regarding their hiatal hernia and different approaches including medical versus surgical management.  She is interested in surgical repair of hiatal hernia and addressing the reflux with a fundoplication.  We did talk about what this in detailed.  I do think she is a candidate for robotic repair of paraesophageal hernia repair with partial fundoplication.  I do think that probably she is better served with a partial fundoplication given some of the dysphagia and some of the prior dilations. Cussed with  her in detail about the procedure.  The risk the benefits and possible complications including but not limited to: Bleeding, infection, bowel and esophageal injuries, bloating recurrence.  She understands and wished to proceed. D/W her in detail about diet modification.   I spent 40 minutes in this encounter including person reviewing imaging studies, coordinating her care, placing orders and performing documentation    Sterling Big, MD [facility] General Surgeon

## 2023-02-25 NOTE — H&P (View-Only) (Signed)
 Outpatient Surgical Follow Up   Morgan [name] is an 47 y.o. female.   Chief Complaint  Patient presents with   Follow-up    Hiatal hernia    HPI:  Morgan [name] is a 47 y.o. female seen in consultation for hiatal hernia.  She does have a chronic history of functional GI issues since 2020 with intermittent abdominal discomfort.  She reports most of her pain is to the right side it is dull intermittent and mild to moderate intensity.  The patient did have an appointment and visit with Dr.Wohl from gastroenterology, she also had a repeat EGD that I have pers reviewed showing moderate paraesophageal. Barium S. Pers reviewed showing moderate HH w reflux Recent colonoscopy was nml She did have a recent MRI that I have personally reviewed showing evidence of a moderate hiatal hernia with at least a third of the stomach within the mediastinum.  She had a segment of the terminal ileum that appeared some tethering without active inflammatory findings She does experience some intermittent reflux symptoms that he seems to be partially relieved by PPI.  She is interested in pursuing surgical options for repair of reflux and hiatal hernia  Surgical history includes hysterectomy, tubal ligation and partial thyroidectomy.  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  Reports it was with 2010 back surgery and with hemi-thyroidectomy.  No problems in other surgeries.   GERD (gastroesophageal reflux disease)    Headache    MIGRAINES - improved with Ajovy   History of hiatal hernia    Hypertension    PONV (postoperative nausea and vomiting)    Stiff neck    mild limitations of  up/down mvmt s/p neck surgery    Past Surgical History:  Procedure Laterality Date   ABDOMINAL HYSTERECTOMY     BACK SURGERY  03/2020   lower back   BALLOON DILATION N/A 04/18/2018   Procedure: BALLOON DILATION;  Surgeon: [name], MD;  Location: [facility];  Service: Endoscopy;  Laterality: N/A;   BIOPSY  02/19/2023   Procedure: BIOPSY;  Surgeon: Wyline Mood, MD;  Location: [facility] ENDOSCOPY;  Service: Gastroenterology;;   COLONOSCOPY WITH PROPOFOL N/A 04/04/2021   Procedure: COLONOSCOPY WITH BIOPSY;  Surgeon: [name], MD;  Location: [facility];  Service: Endoscopy;  Laterality: N/A;  Latex   COLONOSCOPY WITH PROPOFOL N/A 02/19/2023   Procedure: COLONOSCOPY WITH PROPOFOL;  Surgeon: Wyline Mood, MD;  Location: [facility] ENDOSCOPY;  Service: Gastroenterology;  Laterality: N/A;   ESOPHAGOGASTRODUODENOSCOPY (EGD) WITH PROPOFOL N/A 04/18/2018   Procedure: ESOPHAGOGASTRODUODENOSCOPY (EGD) WITH BIOPSIES;  Surgeon: [name], MD;  Location: [facility];  Service: Endoscopy;  Laterality: N/A;   ESOPHAGOGASTRODUODENOSCOPY (EGD) WITH PROPOFOL N/A 02/18/2023   Procedure: ESOPHAGOGASTRODUODENOSCOPY (EGD) WITH PROPOFOL;  Surgeon: [name], MD;  Location: [facility] ENDOSCOPY;  Service: Endoscopy;  Laterality: N/A;   NECK SURGERY     FUSION C5-C7   OOPHORECTOMY     THYROIDECTOMY Left    (Hemi-)   TRACHELECTOMY N/A 10/15/2017   Procedure: TRACHELECTOMY;  Surgeon: Schermerhorn, Ihor Austin, MD;  Location: [facility] ORS;  Service: Gynecology;  Laterality: N/A;   TUBAL LIGATION  Family History  Problem Relation Age of Onset   Hypertension Mother    Arthritis Mother    Lung disease Mother    Arthritis Father    Stroke Father    Hypertension Father    Arthritis Maternal Grandmother    Heart disease Maternal Grandmother    Hypertension Maternal Grandmother    Arthritis Maternal Grandfather    Lung cancer Maternal Grandfather    Heart disease Maternal Grandfather     Arthritis Paternal Grandmother    Heart disease Paternal Grandmother    Hypertension Paternal Grandmother    Arthritis Paternal Grandfather    Heart disease Paternal Grandfather    Stroke Paternal Grandfather    Heart disease Maternal Uncle     Social History:  reports that she quit smoking about 6 years ago. Her smoking use included cigarettes. She started smoking about 31 years ago. She has a 50 pack-year smoking history. She has never used smokeless tobacco. She reports that she does not currently use alcohol. She reports that she does not use drugs.  Allergies:  Allergies  Allergen Reactions   Adhesive [Tape] Other (See Comments)    Bruises skin   Latex Other (See Comments)    Swelling (vaginal)   Lexapro [Escitalopram Oxalate] Other (See Comments)    Loss taste   Metoprolol Other (See Comments)    Lowers heart rate   Topamax [Topiramate]     Loss taste    Medications reviewed.    ROS Full ROS performed and is otherwise negative other than what is stated in HPI   BP 131/83 (BP Location: Right Arm, Patient Position: Sitting, Cuff Size: Small)   Pulse 91   Temp 98.7 F (37.1 C) (Oral)   Ht 5\' 5"  (1.651 m)   Wt 161 lb 9.6 oz (73.3 kg)   SpO2 99%   BMI 26.89 kg/m   Physical Exam CONSTITUTIONAL: NAD. EYES: Pupils are equal, round, and reactive to light, Sclera are non-icteric. EARS, NOSE, MOUTH AND THROAT: . The oral mucosa is pink and moist. Hearing is intact to voice. LYMPH NODES:  Lymph nodes in the neck are normal. Neck: goiter on the right RESPIRATORY:  Lungs are clear. There is normal respiratory effort, with equal breath sounds bilaterally, and without pathologic use of accessory muscles. CARDIOVASCULAR: Heart is regular without murmurs, gallops, or rubs. GI: The abdomen is  soft, nontender, and nondistended. There are no palpable masses. There is no hepatosplenomegaly. There are normal bowel sounds in all quadrants. GU: Rectal deferred.   MUSCULOSKELETAL:  Normal muscle strength and tone. No cyanosis or edema.   SKIN: Turgor is good and there are no pathologic skin lesions or ulcers. NEUROLOGIC: Motor and sensation is grossly normal. Cranial nerves are grossly intact. PSYCH:  Oriented to person, place and time. Affect is normal.   Assessment/Plan: 47 year old female with significant moderate paraesophageal hernia type III in addition with some chronic functional GI issues that they cannot rule out Crohn's disease.    Discussed with her in detail regarding their hiatal hernia and different approaches including medical versus surgical management.  She is interested in surgical repair of hiatal hernia and addressing the reflux with a fundoplication.  We did talk about what this in detailed.  I do think she is a candidate for robotic repair of paraesophageal hernia repair with partial fundoplication.  I do think that probably she is better served with a partial fundoplication given some of the dysphagia and some of the prior dilations. Cussed with  her in detail about the procedure.  The risk the benefits and possible complications including but not limited to: Bleeding, infection, bowel and esophageal injuries, bloating recurrence.  She understands and wished to proceed. D/W her in detail about diet modification.   I spent 40 minutes in this encounter including person reviewing imaging studies, coordinating her care, placing orders and performing documentation    Sterling Big, MD [facility] General Surgeon

## 2023-02-27 LAB — CALPROTECTIN, FECAL: Calprotectin, Fecal: 48 ug/g (ref 0–120)

## 2023-03-06 ENCOUNTER — Other Ambulatory Visit: Payer: Self-pay | Admitting: Nurse Practitioner

## 2023-03-06 DIAGNOSIS — E782 Mixed hyperlipidemia: Secondary | ICD-10-CM

## 2023-03-07 ENCOUNTER — Other Ambulatory Visit: Payer: Self-pay | Admitting: Nurse Practitioner

## 2023-03-09 ENCOUNTER — Other Ambulatory Visit: Payer: Self-pay | Admitting: Nurse Practitioner

## 2023-03-09 ENCOUNTER — Telehealth: Payer: Self-pay

## 2023-03-10 ENCOUNTER — Telehealth: Payer: Self-pay | Admitting: Nurse Practitioner

## 2023-03-10 ENCOUNTER — Telehealth: Payer: Self-pay | Admitting: *Deleted

## 2023-03-10 NOTE — Telephone Encounter (Signed)
Faxed FMLA to [phone]

## 2023-03-10 NOTE — Telephone Encounter (Signed)
Patient requested all future refills for atorvastatin (LIPITOR) 10 MG tablet to be sent to [facility]

## 2023-03-10 NOTE — Telephone Encounter (Signed)
She needs to decide which pharmacy she wants either mail order or the local one

## 2023-03-11 ENCOUNTER — Telehealth: Payer: Self-pay

## 2023-03-11 ENCOUNTER — Ambulatory Visit: Payer: BC Managed Care – PPO | Admitting: Gastroenterology

## 2023-03-11 NOTE — Telephone Encounter (Signed)
Received fax from pharmacy Medication needs authorization.

## 2023-03-11 NOTE — Telephone Encounter (Addendum)
Contacted pt. Pt stated that [facility] continues to place medication in the wrong mailbox. Stated to have pharmacy routinely changed to [facility] [address].

## 2023-03-12 ENCOUNTER — Telehealth: Payer: Self-pay

## 2023-03-12 ENCOUNTER — Other Ambulatory Visit (HOSPITAL_COMMUNITY): Payer: Self-pay

## 2023-03-12 NOTE — Telephone Encounter (Signed)
Pharmacy Patient Advocate Encounter   Received notification from Pt Calls Messages that prior authorization for Omeprazole 40MG  dr capsules is required/requested.   Insurance verification completed.   The patient is insured through [facility] .   Per test claim: PA required; PA submitted to above mentioned insurance via CoverMyMeds Key/confirmation #/EOC [id number] Status is pending

## 2023-03-16 ENCOUNTER — Encounter
Admission: RE | Admit: 2023-03-16 | Discharge: 2023-03-16 | Disposition: A | Discharge status: Home / Self Care | Payer: BC Managed Care – PPO | Source: Ambulatory Visit | Attending: Surgery | Admitting: Surgery

## 2023-03-16 ENCOUNTER — Other Ambulatory Visit (HOSPITAL_COMMUNITY): Payer: Self-pay

## 2023-03-16 ENCOUNTER — Other Ambulatory Visit: Payer: Self-pay

## 2023-03-16 VITALS — Ht 65.0 in | Wt 160.0 lb

## 2023-03-16 DIAGNOSIS — E782 Mixed hyperlipidemia: Secondary | ICD-10-CM

## 2023-03-16 DIAGNOSIS — I10 Essential (primary) hypertension: Secondary | ICD-10-CM

## 2023-03-16 DIAGNOSIS — E118 Type 2 diabetes mellitus with unspecified complications: Secondary | ICD-10-CM

## 2023-03-16 NOTE — Telephone Encounter (Signed)
PA request has been Approved. New Encounter created for follow up. For additional info see Pharmacy Prior Auth telephone encounter from 03/12/23.

## 2023-03-16 NOTE — Patient Instructions (Signed)
Your procedure is scheduled on: Tuesday 03/23/23 To find out your arrival time, please call [phone] between 1PM - 3PM on:   Monday 03/22/23 Report to the Registration Desk on the 1st floor of the Medical Mall. Free Valet parking is available.  If your arrival time is 6:00 am, do not arrive before that time as the [facility] entrance doors do not open until 6:00 am.  REMEMBER: Instructions that are not followed completely may result in serious medical risk, up to and including death; or upon the discretion of your surgeon and anesthesiologist your surgery may need to be rescheduled.  Do not eat food after midnight the night before surgery.  No gum chewing or hard candies.  You may however, drink CLEAR liquids up to 2 hours before you are scheduled to arrive for your surgery. Do not drink anything within 2 hours of your scheduled arrival time.  Clear liquids include: - water  - apple juice without pulp - gatorade (not RED colors) - black coffee or tea (Do NOT add milk or creamers to the coffee or tea) Do NOT drink anything that is not on this list.  Type 1 and Type 2 diabetics should only drink water.  One week prior to surgery: Stop Anti-inflammatories (NSAIDS) such as Advil, Aleve, Ibuprofen, Motrin, Naproxen, Naprosyn and Aspirin based products such as Excedrin, Goody's Powder, BC Powder. You may however, continue to take Tylenol if needed for pain up until the day of surgery.  Stop ANY OVER THE COUNTER supplements for 7 days until after surgery.  Continue taking all prescribed medications.   TAKE ONLY THESE MEDICATIONS THE MORNING OF SURGERY WITH A SIP OF WATER:  atorvastatin (LIPITOR) 10 MG tablet  gabapentin (NEURONTIN) 400 MG capsule  nortriptyline (PAMELOR) 10 MG capsule  omeprazole (PRILOSEC) 40 MG capsule  valACYclovir (VALTREX) 500 MG tablet   No Alcohol for 24 hours before or after surgery.  No Smoking including e-cigarettes for 24 hours before surgery.  No  chewable tobacco products for at least 6 hours before surgery.  No nicotine patches on the day of surgery.  Do not use any "recreational" drugs for at least a week (preferably 2 weeks) before your surgery.  Please be advised that the combination of cocaine and anesthesia may have negative outcomes, up to and including death. If you test positive for cocaine, your surgery will be cancelled.  On the morning of surgery brush your teeth with toothpaste and water, you may rinse your mouth with mouthwash if you wish. Do not swallow any toothpaste or mouthwash.  Use CHG Soap or wipes as directed on instruction sheet.  Do not wear lotions, powders, or perfumes.   Do not shave body hair from the neck down 48 hours before surgery.  Wear comfortable clothing (specific to your surgery type) to the hospital.  Do not wear jewelry, make-up, hairpins, clips or nail polish.  For welded (permanent) jewelry: bracelets, anklets, waist bands, etc.  Please have this removed prior to surgery.  If it is not removed, there is a chance that hospital personnel will need to cut it off on the day of surgery. Contact lenses, hearing aids and dentures may not be worn into surgery.  Do not bring valuables to the hospital. [facility] is not responsible for any missing/lost belongings or valuables.   Notify your doctor if there is any change in your medical condition (cold, fever, infection).  If you are being discharged the day of surgery, you will not  be allowed to drive home. You will need a responsible individual to drive you home and stay with you for 24 hours after surgery.   If you are taking public transportation, you will need to have a responsible individual with you.  If you are being admitted to the hospital overnight, leave your suitcase in the car. After surgery it may be brought to your room.  In case of increased patient census, it may be necessary for you, the patient, to continue your  postoperative care in the Same Day Surgery department.  After surgery, you can help prevent lung complications by doing breathing exercises.  Take deep breaths and cough every 1-2 hours. Your doctor may order a device called an Incentive Spirometer to help you take deep breaths. When coughing or sneezing, hold a pillow firmly against your incision with both hands. This is called "splinting." Doing this helps protect your incision. It also decreases belly discomfort.  Surgery Visitation Policy:  Patients undergoing a surgery or procedure may have two family members or support persons with them as long as the person is not COVID-19 positive or experiencing its symptoms.   Inpatient Visitation:    Visiting hours are 7 a.m. to 8 p.m. Up to four visitors are allowed at one time in a patient room. The visitors may rotate out with other people during the day. One designated support person (adult) may remain overnight.  Please call the Pre-admissions Testing Dept. at [phone] if you have any questions about these instructions.     Preparing for Surgery with CHLORHEXIDINE GLUCONATE (CHG) Soap  Chlorhexidine Gluconate (CHG) Soap  o An antiseptic cleaner that kills germs and bonds with the skin to continue killing germs even after washing  o Used for showering the night before surgery and morning of surgery  Before surgery, you can play an important role by reducing the number of germs on your skin.  CHG (Chlorhexidine gluconate) soap is an antiseptic cleanser which kills germs and bonds with the skin to continue killing germs even after washing.  Please do not use if you have an allergy to CHG or antibacterial soaps. If your skin becomes reddened/irritated stop using the CHG.  1. Shower the NIGHT BEFORE SURGERY and the MORNING OF SURGERY with CHG soap.  2. If you choose to wash your hair, wash your hair first as usual with your normal shampoo.  3. After shampooing, rinse your hair  and body thoroughly to remove the shampoo.  4. Use CHG as you would any other liquid soap. You can apply CHG directly to the skin and wash gently with a scrungie or a clean washcloth.  5. Apply the CHG soap to your body only from the neck down. Do not use on open wounds or open sores. Avoid contact with your eyes, ears, mouth, and genitals (private parts). Wash face and genitals (private parts) with your normal soap.  6. Wash thoroughly, paying special attention to the area where your surgery will be performed.  7. Thoroughly rinse your body with warm water.  8. Do not shower/wash with your normal soap after using and rinsing off the CHG soap.  9. Pat yourself dry with a clean towel.  10. Wear clean pajamas to bed the night before surgery.  12. Place clean sheets on your bed the night of your first shower and do not sleep with pets.  13. Shower again with the CHG soap on the day of surgery prior to arriving at the hospital.  14. Do not apply any deodorants/lotions/powders.  15. Please wear clean clothes to the hospital.

## 2023-03-16 NOTE — Telephone Encounter (Signed)
Pharmacy Patient Advocate Encounter  Received notification from [facility] that Prior Authorization for Omeprazole 40MG  dr capsules  has been APPROVED from 03/12/23 to 03/11/24. Ran test claim, Copay is $0. This test claim was processed through [facility]- copay amounts may vary at other pharmacies due to pharmacy/plan contracts, or as the patient moves through the different stages of their insurance plan.   PA #/Case ID/Reference #:  [id number]

## 2023-03-22 ENCOUNTER — Encounter
Admission: RE | Admit: 2023-03-22 | Discharge: 2023-03-22 | Disposition: A | Discharge status: Home / Self Care | Payer: BC Managed Care – PPO | Source: Ambulatory Visit | Attending: Surgery

## 2023-03-22 DIAGNOSIS — E782 Mixed hyperlipidemia: Secondary | ICD-10-CM | POA: Insufficient documentation

## 2023-03-22 DIAGNOSIS — Z01810 Encounter for preprocedural cardiovascular examination: Secondary | ICD-10-CM | POA: Diagnosis not present

## 2023-03-22 DIAGNOSIS — I10 Essential (primary) hypertension: Secondary | ICD-10-CM | POA: Diagnosis not present

## 2023-03-22 MED ORDER — ACETAMINOPHEN 500 MG PO TABS
1000.0000 mg | ORAL_TABLET | ORAL | Status: AC
  Administered 2023-03-23: 1000 mg via ORAL

## 2023-03-22 MED ORDER — CEFAZOLIN SODIUM-DEXTROSE 2-4 GM/100ML-% IV SOLN
2.0000 g | INTRAVENOUS | Status: AC
  Administered 2023-03-23: 2 g via INTRAVENOUS

## 2023-03-22 MED ORDER — GABAPENTIN 300 MG PO CAPS
300.0000 mg | ORAL_CAPSULE | ORAL | Status: AC
  Administered 2023-03-23: 300 mg via ORAL

## 2023-03-22 MED ORDER — CHLORHEXIDINE GLUCONATE CLOTH 2 % EX PADS: 6.0000 | MEDICATED_PAD | Freq: Once | CUTANEOUS | Status: DC

## 2023-03-22 MED ORDER — CHLORHEXIDINE GLUCONATE 0.12 % MT SOLN
15.0000 mL | Freq: Once | OROMUCOSAL | Status: AC
  Administered 2023-03-23: 15 mL via OROMUCOSAL

## 2023-03-22 MED ORDER — ORAL CARE MOUTH RINSE: 15.0000 mL | Freq: Once | OROMUCOSAL | Status: AC

## 2023-03-22 MED ORDER — SODIUM CHLORIDE 0.9 % IV SOLN: INTRAVENOUS | Status: DC

## 2023-03-22 MED ORDER — CELECOXIB 200 MG PO CAPS
200.0000 mg | ORAL_CAPSULE | ORAL | Status: AC
  Administered 2023-03-23: 200 mg via ORAL

## 2023-03-23 ENCOUNTER — Other Ambulatory Visit: Payer: Self-pay

## 2023-03-23 ENCOUNTER — Ambulatory Visit: Payer: BC Managed Care – PPO

## 2023-03-23 ENCOUNTER — Encounter
Admission: RE | Disposition: A | Discharge status: Home / Self Care | Payer: Self-pay | Source: Home / Self Care | Attending: Surgery

## 2023-03-23 ENCOUNTER — Encounter: Payer: Self-pay | Admitting: Surgery

## 2023-03-23 ENCOUNTER — Observation Stay
Admission: RE | Admit: 2023-03-23 | Discharge: 2023-03-24 | Disposition: A | Discharge status: Home / Self Care | Payer: BC Managed Care – PPO | Attending: Surgery | Admitting: Surgery

## 2023-03-23 DIAGNOSIS — Z8719 Personal history of other diseases of the digestive system: Principal | ICD-10-CM

## 2023-03-23 DIAGNOSIS — K219 Gastro-esophageal reflux disease without esophagitis: Secondary | ICD-10-CM | POA: Diagnosis not present

## 2023-03-23 DIAGNOSIS — E119 Type 2 diabetes mellitus without complications: Secondary | ICD-10-CM | POA: Diagnosis not present

## 2023-03-23 DIAGNOSIS — E118 Type 2 diabetes mellitus with unspecified complications: Secondary | ICD-10-CM

## 2023-03-23 DIAGNOSIS — K449 Diaphragmatic hernia without obstruction or gangrene: Secondary | ICD-10-CM | POA: Diagnosis not present

## 2023-03-23 DIAGNOSIS — I10 Essential (primary) hypertension: Secondary | ICD-10-CM | POA: Diagnosis not present

## 2023-03-23 DIAGNOSIS — Z91040 Latex allergy status: Secondary | ICD-10-CM | POA: Insufficient documentation

## 2023-03-23 DIAGNOSIS — Z87891 Personal history of nicotine dependence: Secondary | ICD-10-CM | POA: Diagnosis not present

## 2023-03-23 HISTORY — PX: XI ROBOTIC ASSISTED PARAESOPHAGEAL HERNIA REPAIR: SHX6871

## 2023-03-23 LAB — CREATININE, SERUM
Creatinine, Ser: 0.88 mg/dL (ref 0.44–1.00)
GFR, Estimated: >60 mL/min (ref >60)

## 2023-03-23 LAB — GLUCOSE, CAPILLARY
Glucose-Capillary: 99 mg/dL (ref 70–99)
Glucose-Capillary: 243 mg/dL — ABNORMAL HIGH (ref 70–99)

## 2023-03-23 LAB — CBC
HCT: 37.7 % (ref 36.0–46.0)
Hemoglobin: 12.3 g/dL (ref 12.0–15.0)
MCH: 27.5 pg (ref 26.0–34.0)
MCHC: 32.6 g/dL (ref 30.0–36.0)
MCV: 84.3 fL (ref 80.0–100.0)
Platelets: 272 10*3/uL (ref 150–400)
RBC: 4.47 MIL/uL (ref 3.87–5.11)
RDW: 15.1 % (ref 11.5–15.5)
WBC: 11.5 10*3/uL — ABNORMAL HIGH (ref 4.0–10.5)
nRBC: 0 % (ref 0.0–0.2)

## 2023-03-23 LAB — HIV ANTIBODY (ROUTINE TESTING W REFLEX): HIV Screen 4th Generation wRfx: NONREACTIVE

## 2023-03-23 SURGERY — REPAIR, HERNIA, PARAESOPHAGEAL, ROBOT-ASSISTED
Anesthesia: General

## 2023-03-23 MED ORDER — METHYLENE BLUE (ANTIDOTE) 1 % IV SOLN
INTRAVENOUS | Status: DC | PRN
Start: 2023-03-23 — End: 2023-03-23
  Administered 2023-03-23: 100 mg

## 2023-03-23 MED ORDER — PROCHLORPERAZINE MALEATE 10 MG PO TABS: 10.0000 mg | ORAL_TABLET | Freq: Four times a day (QID) | ORAL | Status: DC | PRN

## 2023-03-23 MED ORDER — GABAPENTIN 300 MG PO CAPS
400.0000 mg | ORAL_CAPSULE | Freq: Every day | ORAL | Status: DC
  Administered 2023-03-24: 400 mg via ORAL

## 2023-03-23 MED ORDER — 0.9 % SODIUM CHLORIDE (POUR BTL) OPTIME
TOPICAL | Status: DC | PRN
  Administered 2023-03-23: 500 mL

## 2023-03-23 MED ORDER — PHENYLEPHRINE HCL-NACL 20-0.9 MG/250ML-% IV SOLN
INTRAVENOUS | Status: DC | PRN
  Administered 2023-03-23: 30 ug/min via INTRAVENOUS

## 2023-03-23 MED ORDER — DEXAMETHASONE SODIUM PHOSPHATE 10 MG/ML IJ SOLN
INTRAMUSCULAR | Status: DC | PRN
  Administered 2023-03-23: 10 mg via INTRAVENOUS

## 2023-03-23 MED ORDER — PROPOFOL 10 MG/ML IV BOLUS
INTRAVENOUS | Status: DC | PRN
  Administered 2023-03-23 (×2): 150 ug/kg/min via INTRAVENOUS

## 2023-03-23 MED ORDER — FENTANYL CITRATE (PF) 100 MCG/2ML IJ SOLN
INTRAMUSCULAR | Status: AC
  Filled 2023-03-23: qty 2

## 2023-03-23 MED ORDER — ONDANSETRON HCL 4 MG/2ML IJ SOLN: 4.0000 mg | Freq: Four times a day (QID) | INTRAMUSCULAR | Status: DC | PRN

## 2023-03-23 MED ORDER — ACETAMINOPHEN 500 MG PO TABS
1000.0000 mg | ORAL_TABLET | Freq: Four times a day (QID) | ORAL | Status: DC
  Administered 2023-03-23 – 2023-03-24 (×5): 1000 mg via ORAL

## 2023-03-23 MED ORDER — GABAPENTIN 300 MG PO CAPS
ORAL_CAPSULE | ORAL | Status: AC
  Filled 2023-03-23: qty 2

## 2023-03-23 MED ORDER — DROPERIDOL 2.5 MG/ML IJ SOLN
0.6250 mg | Freq: Once | INTRAMUSCULAR | Status: AC | PRN
  Administered 2023-03-23: 0.625 mg via INTRAVENOUS

## 2023-03-23 MED ORDER — KETOROLAC TROMETHAMINE 30 MG/ML IJ SOLN
INTRAMUSCULAR | Status: AC
  Filled 2023-03-23: qty 1

## 2023-03-23 MED ORDER — PROPOFOL 1000 MG/100ML IV EMUL
INTRAVENOUS | Status: AC
  Filled 2023-03-23: qty 100

## 2023-03-23 MED ORDER — METHYLENE BLUE (ANTIDOTE) 1 % IV SOLN
INTRAVENOUS | Status: AC
  Filled 2023-03-23: qty 10

## 2023-03-23 MED ORDER — LACTATED RINGERS IV SOLN: INTRAVENOUS | Status: DC | PRN

## 2023-03-23 MED ORDER — CLONAZEPAM 0.5 MG PO TABS
0.5000 mg | ORAL_TABLET | Freq: Every day | ORAL | Status: DC
  Administered 2023-03-23: 0.5 mg via ORAL

## 2023-03-23 MED ORDER — GABAPENTIN 300 MG PO CAPS
ORAL_CAPSULE | ORAL | Status: AC
  Filled 2023-03-23: qty 1

## 2023-03-23 MED ORDER — ALBUMIN HUMAN 5 % IV SOLN: INTRAVENOUS | Status: DC | PRN

## 2023-03-23 MED ORDER — DIPHENHYDRAMINE HCL 12.5 MG/5ML PO ELIX: 12.5000 mg | ORAL_SOLUTION | Freq: Four times a day (QID) | ORAL | Status: DC | PRN

## 2023-03-23 MED ORDER — QUETIAPINE FUMARATE 100 MG PO TABS
100.0000 mg | ORAL_TABLET | Freq: Every day | ORAL | Status: DC
  Administered 2023-03-23: 100 mg via ORAL
  Filled 2023-03-23: qty 1

## 2023-03-23 MED ORDER — KETAMINE HCL 50 MG/5ML IJ SOSY
PREFILLED_SYRINGE | INTRAMUSCULAR | Status: AC
  Filled 2023-03-23: qty 5

## 2023-03-23 MED ORDER — ONDANSETRON 4 MG PO TBDP: 4.0000 mg | ORAL_TABLET | Freq: Four times a day (QID) | ORAL | Status: DC | PRN

## 2023-03-23 MED ORDER — LACTATED RINGERS IV SOLN
INTRAVENOUS | Status: AC
Start: 2023-03-23 — End: 2023-03-24

## 2023-03-23 MED ORDER — BUPIVACAINE-EPINEPHRINE (PF) 0.25% -1:200000 IJ SOLN
INTRAMUSCULAR | Status: AC
  Filled 2023-03-23: qty 30

## 2023-03-23 MED ORDER — OXYCODONE HCL 5 MG PO TABS
ORAL_TABLET | ORAL | Status: AC
  Filled 2023-03-23: qty 1

## 2023-03-23 MED ORDER — ENOXAPARIN SODIUM 40 MG/0.4ML IJ SOSY
40.0000 mg | PREFILLED_SYRINGE | INTRAMUSCULAR | Status: DC
  Administered 2023-03-24: 40 mg via SUBCUTANEOUS

## 2023-03-23 MED ORDER — CEFAZOLIN SODIUM-DEXTROSE 2-4 GM/100ML-% IV SOLN
INTRAVENOUS | Status: AC
  Filled 2023-03-23: qty 100

## 2023-03-23 MED ORDER — ACETAMINOPHEN 500 MG PO TABS
ORAL_TABLET | ORAL | Status: AC
  Filled 2023-03-23: qty 2

## 2023-03-23 MED ORDER — ACETAMINOPHEN 500 MG PO TABS
ORAL_TABLET | ORAL | Status: AC
Start: 2023-03-23 — End: ?
  Filled 2023-03-23: qty 2

## 2023-03-23 MED ORDER — BUPIVACAINE LIPOSOME 1.3 % IJ SUSP
INTRAMUSCULAR | Status: AC
Start: 2023-03-23 — End: ?
  Filled 2023-03-23: qty 20

## 2023-03-23 MED ORDER — GABAPENTIN 100 MG PO CAPS
ORAL_CAPSULE | ORAL | Status: AC
  Filled 2023-03-23: qty 2

## 2023-03-23 MED ORDER — NORTRIPTYLINE HCL 25 MG PO CAPS
50.0000 mg | ORAL_CAPSULE | Freq: Every day | ORAL | Status: DC
  Administered 2023-03-23: 50 mg via ORAL
  Filled 2023-03-23: qty 2

## 2023-03-23 MED ORDER — OXYCODONE HCL 5 MG PO TABS
5.0000 mg | ORAL_TABLET | ORAL | Status: DC | PRN
  Administered 2023-03-23 – 2023-03-24 (×2): 5 mg via ORAL

## 2023-03-23 MED ORDER — GABAPENTIN 300 MG PO CAPS
800.0000 mg | ORAL_CAPSULE | Freq: Every day | ORAL | Status: DC
  Administered 2023-03-23: 800 mg via ORAL

## 2023-03-23 MED ORDER — HEMOSTATIC AGENTS (NO CHARGE) OPTIME
TOPICAL | Status: DC | PRN
  Administered 2023-03-23: 1 via TOPICAL

## 2023-03-23 MED ORDER — BENAZEPRIL HCL 10 MG PO TABS
10.0000 mg | ORAL_TABLET | Freq: Every day | ORAL | Status: DC
  Administered 2023-03-24: 10 mg via ORAL
  Filled 2023-03-23 (×2): qty 1

## 2023-03-23 MED ORDER — PROPOFOL 1000 MG/100ML IV EMUL
INTRAVENOUS | Status: AC
  Filled 2023-03-23: qty 200

## 2023-03-23 MED ORDER — FENTANYL CITRATE (PF) 100 MCG/2ML IJ SOLN
25.0000 ug | INTRAMUSCULAR | Status: DC | PRN
  Administered 2023-03-23 (×3): 50 ug via INTRAVENOUS

## 2023-03-23 MED ORDER — METHOCARBAMOL 500 MG PO TABS
ORAL_TABLET | ORAL | Status: AC
  Filled 2023-03-23: qty 1

## 2023-03-23 MED ORDER — CYCLOBENZAPRINE HCL 10 MG PO TABS
10.0000 mg | ORAL_TABLET | Freq: Every day | ORAL | Status: DC
  Administered 2023-03-23: 10 mg via ORAL
  Filled 2023-03-23: qty 1

## 2023-03-23 MED ORDER — VISTASEAL 10 ML SINGLE DOSE KIT
PACK | CUTANEOUS | Status: AC
  Filled 2023-03-23: qty 10

## 2023-03-23 MED ORDER — ONDANSETRON HCL 4 MG/2ML IJ SOLN
INTRAMUSCULAR | Status: DC | PRN
  Administered 2023-03-23 (×2): 4 mg via INTRAVENOUS

## 2023-03-23 MED ORDER — MIDAZOLAM HCL 2 MG/2ML IJ SOLN
INTRAMUSCULAR | Status: AC
  Filled 2023-03-23: qty 2

## 2023-03-23 MED ORDER — METHOCARBAMOL 1000 MG/10ML IJ SOLN: 500.0000 mg | Freq: Three times a day (TID) | INTRAMUSCULAR | Status: DC | PRN

## 2023-03-23 MED ORDER — METHOCARBAMOL 500 MG PO TABS
500.0000 mg | ORAL_TABLET | Freq: Three times a day (TID) | ORAL | Status: DC | PRN
  Administered 2023-03-23: 500 mg via ORAL

## 2023-03-23 MED ORDER — KETOROLAC TROMETHAMINE 30 MG/ML IJ SOLN
30.0000 mg | Freq: Four times a day (QID) | INTRAMUSCULAR | Status: DC
  Administered 2023-03-23 – 2023-03-24 (×5): 30 mg via INTRAVENOUS

## 2023-03-23 MED ORDER — PROCHLORPERAZINE EDISYLATE 10 MG/2ML IJ SOLN: 5.0000 mg | Freq: Four times a day (QID) | INTRAMUSCULAR | Status: DC | PRN

## 2023-03-23 MED ORDER — CLONAZEPAM 0.5 MG PO TABS
ORAL_TABLET | ORAL | Status: AC
  Filled 2023-03-23: qty 1

## 2023-03-23 MED ORDER — GABAPENTIN 100 MG PO CAPS: 400.0000 mg | ORAL_CAPSULE | ORAL | Status: DC

## 2023-03-23 MED ORDER — SUGAMMADEX SODIUM 200 MG/2ML IV SOLN
INTRAVENOUS | Status: DC | PRN
  Administered 2023-03-23: 400 mg via INTRAVENOUS

## 2023-03-23 MED ORDER — MORPHINE SULFATE (PF) 4 MG/ML IV SOLN: 2.0000 mg | INTRAVENOUS | Status: DC | PRN

## 2023-03-23 MED ORDER — LIDOCAINE HCL (PF) 2 % IJ SOLN
INTRAMUSCULAR | Status: AC
  Filled 2023-03-23: qty 5

## 2023-03-23 MED ORDER — KETAMINE HCL 50 MG/5ML IJ SOSY
PREFILLED_SYRINGE | INTRAMUSCULAR | Status: DC | PRN
  Administered 2023-03-23: 20 mg via INTRAVENOUS
  Administered 2023-03-23 (×3): 10 mg via INTRAVENOUS

## 2023-03-23 MED ORDER — DROPERIDOL 2.5 MG/ML IJ SOLN
INTRAMUSCULAR | Status: AC
  Filled 2023-03-23: qty 2

## 2023-03-23 MED ORDER — ROCURONIUM BROMIDE 10 MG/ML (PF) SYRINGE
PREFILLED_SYRINGE | INTRAVENOUS | Status: AC
Start: 2023-03-23 — End: ?
  Filled 2023-03-23: qty 10

## 2023-03-23 MED ORDER — ROCURONIUM BROMIDE 100 MG/10ML IV SOLN
INTRAVENOUS | Status: DC | PRN
  Administered 2023-03-23: 30 mg via INTRAVENOUS
  Administered 2023-03-23: 10 mg via INTRAVENOUS
  Administered 2023-03-23: 60 mg via INTRAVENOUS

## 2023-03-23 MED ORDER — MIDAZOLAM HCL 2 MG/2ML IJ SOLN
INTRAMUSCULAR | Status: DC | PRN
  Administered 2023-03-23: 2 mg via INTRAVENOUS

## 2023-03-23 MED ORDER — CHLORHEXIDINE GLUCONATE 0.12 % MT SOLN
OROMUCOSAL | Status: AC
  Filled 2023-03-23: qty 15

## 2023-03-23 MED ORDER — BUPIVACAINE LIPOSOME 1.3 % IJ SUSP
INTRAMUSCULAR | Status: DC | PRN
  Administered 2023-03-23: 20 mL

## 2023-03-23 MED ORDER — NORTRIPTYLINE HCL 10 MG PO CAPS
10.0000 mg | ORAL_CAPSULE | ORAL | Status: DC
  Administered 2023-03-24: 10 mg via ORAL
  Filled 2023-03-23: qty 1

## 2023-03-23 MED ORDER — DIPHENHYDRAMINE HCL 50 MG/ML IJ SOLN: 12.5000 mg | Freq: Four times a day (QID) | INTRAMUSCULAR | Status: DC | PRN

## 2023-03-23 MED ORDER — DEXMEDETOMIDINE HCL IN NACL 80 MCG/20ML IV SOLN
INTRAVENOUS | Status: DC | PRN
  Administered 2023-03-23: 8 ug via INTRAVENOUS
  Administered 2023-03-23: 4 ug via INTRAVENOUS
  Administered 2023-03-23: 8 ug via INTRAVENOUS

## 2023-03-23 MED ORDER — FENTANYL CITRATE (PF) 100 MCG/2ML IJ SOLN
INTRAMUSCULAR | Status: DC | PRN
  Administered 2023-03-23 (×2): 50 ug via INTRAVENOUS
  Administered 2023-03-23: 25 ug via INTRAVENOUS
  Administered 2023-03-23: 50 ug via INTRAVENOUS
  Administered 2023-03-23: 25 ug via INTRAVENOUS

## 2023-03-23 MED ORDER — ONDANSETRON HCL 4 MG/2ML IJ SOLN
INTRAMUSCULAR | Status: AC
  Filled 2023-03-23: qty 2

## 2023-03-23 MED ORDER — BUPIVACAINE-EPINEPHRINE 0.25% -1:200000 IJ SOLN
INTRAMUSCULAR | Status: DC | PRN
  Administered 2023-03-23: 30 mL

## 2023-03-23 MED ORDER — CELECOXIB 200 MG PO CAPS
ORAL_CAPSULE | ORAL | Status: AC
Start: 2023-03-23 — End: ?
  Filled 2023-03-23: qty 1

## 2023-03-23 MED ORDER — ROCURONIUM BROMIDE 10 MG/ML (PF) SYRINGE
PREFILLED_SYRINGE | INTRAVENOUS | Status: AC
  Filled 2023-03-23: qty 10

## 2023-03-23 MED ORDER — DEXAMETHASONE SODIUM PHOSPHATE 10 MG/ML IJ SOLN
INTRAMUSCULAR | Status: AC
  Filled 2023-03-23: qty 1

## 2023-03-23 MED ORDER — LIDOCAINE HCL (CARDIAC) PF 100 MG/5ML IV SOSY
PREFILLED_SYRINGE | INTRAVENOUS | Status: DC | PRN
  Administered 2023-03-23: 80 mg via INTRAVENOUS

## 2023-03-23 MED ORDER — PHENYLEPHRINE 80 MCG/ML (10ML) SYRINGE FOR IV PUSH (FOR BLOOD PRESSURE SUPPORT)
PREFILLED_SYRINGE | INTRAVENOUS | Status: DC | PRN
  Administered 2023-03-23: 80 ug via INTRAVENOUS
  Administered 2023-03-23: 160 ug via INTRAVENOUS
  Administered 2023-03-23 (×2): 80 ug via INTRAVENOUS

## 2023-03-23 MED ORDER — VASOPRESSIN 20 UNIT/ML IV SOLN
INTRAVENOUS | Status: DC | PRN
  Administered 2023-03-23 (×4): 1 [IU] via INTRAVENOUS

## 2023-03-23 MED ORDER — MELATONIN 3 MG PO TABS: 3.0000 mg | ORAL_TABLET | Freq: Every evening | ORAL | Status: DC | PRN

## 2023-03-23 MED ORDER — PROPOFOL 10 MG/ML IV BOLUS
INTRAVENOUS | Status: AC
  Filled 2023-03-23: qty 20

## 2023-03-23 MED ORDER — VASOPRESSIN 20 UNIT/ML IV SOLN
INTRAVENOUS | Status: AC
  Filled 2023-03-23: qty 1

## 2023-03-23 MED ORDER — SUMATRIPTAN SUCCINATE 50 MG PO TABS: 50.0000 mg | ORAL_TABLET | ORAL | Status: DC | PRN

## 2023-03-23 SURGICAL SUPPLY — 51 items
APPLICATOR VISTASEAL 35 (MISCELLANEOUS) IMPLANT
CANNULA REDUCER 12-8 DVNC XI (CANNULA) ×1 IMPLANT
CLIP LIGATING HEM O LOK PURPLE (MISCELLANEOUS) IMPLANT
DERMABOND ADVANCED .7 DNX12 (GAUZE/BANDAGES/DRESSINGS) ×1 IMPLANT
DRAPE ARM DVNC X/XI (DISPOSABLE) ×4 IMPLANT
DRAPE COLUMN DVNC XI (DISPOSABLE) ×1 IMPLANT
ELECT REM PT RETURN 9FT ADLT (ELECTROSURGICAL) ×1
ELECTRODE REM PT RTRN 9FT ADLT (ELECTROSURGICAL) ×1 IMPLANT
FORCEPS BPLR R/ABLATION 8 DVNC (INSTRUMENTS) ×1 IMPLANT
GLOVE BIO SURGEON STRL SZ7 (GLOVE) ×3 IMPLANT
GOWN STRL REUS W/ TWL LRG LVL3 (GOWN DISPOSABLE) ×4 IMPLANT
GRASPER LAPSCPC 5X45 DSP (INSTRUMENTS) ×1 IMPLANT
GRASPER TIP-UP FEN DVNC XI (INSTRUMENTS) ×1 IMPLANT
IRRIGATION STRYKERFLOW (MISCELLANEOUS) IMPLANT
IRRIGATOR STRYKERFLOW (MISCELLANEOUS) ×1
IV NS 1000ML BAXH (IV SOLUTION) IMPLANT
KIT IMAGING PINPOINTPAQ (MISCELLANEOUS) ×1 IMPLANT
KIT PINK PAD W/HEAD ARE REST (MISCELLANEOUS) ×1
KIT PINK PAD W/HEAD ARM REST (MISCELLANEOUS) ×1 IMPLANT
LABEL OR SOLS (LABEL) ×1 IMPLANT
MANIFOLD NEPTUNE II (INSTRUMENTS) ×1 IMPLANT
MESH BIO-A 7X10 SYN MAT (Mesh General) IMPLANT
NDL DRIVE SUT CUT DVNC (INSTRUMENTS) ×1 IMPLANT
NDL HYPO 22X1.5 SAFETY MO (MISCELLANEOUS) ×1 IMPLANT
NEEDLE DRIVE SUT CUT DVNC (INSTRUMENTS) ×1
NEEDLE HYPO 22X1.5 SAFETY MO (MISCELLANEOUS) ×1
OBTURATOR OPTICAL STND 8 DVNC (TROCAR) ×1
OBTURATOR OPTICALSTD 8 DVNC (TROCAR) ×1 IMPLANT
PACK LAP CHOLECYSTECTOMY (MISCELLANEOUS) ×1 IMPLANT
SEAL UNIV 5-12 XI (MISCELLANEOUS) ×4 IMPLANT
SEALER VESSEL EXT DVNC XI (MISCELLANEOUS) ×1 IMPLANT
SOL ELECTROSURG ANTI STICK (MISCELLANEOUS) ×1
SOLUTION ELECTROSURG ANTI STCK (MISCELLANEOUS) ×1 IMPLANT
SPIKE FLUID TRANSFER (MISCELLANEOUS) ×1 IMPLANT
SPONGE T-LAP 18X18 ~~LOC~~+RFID (SPONGE) ×1 IMPLANT
SUT MNCRL 4-0 27XMFL (SUTURE) ×1
SUT SILK 2 0 SH (SUTURE) ×2 IMPLANT
SUT STRATA 2-0 23CM CT-2 (SUTURE) ×1 IMPLANT
SUT VIC AB 3-0 SH 27X BRD (SUTURE) IMPLANT
SUT VICRYL 0 UR6 27IN ABS (SUTURE) ×2 IMPLANT
SUTURE MNCRL 4-0 27XMF (SUTURE) ×1 IMPLANT
SYR 30ML LL (SYRINGE) ×1 IMPLANT
SYR TOOMEY IRRIG 70ML (MISCELLANEOUS) ×1
SYRINGE TOOMEY IRRIG 70ML (MISCELLANEOUS) ×1 IMPLANT
SYS BAG RETRIEVAL 10MM (BASKET) ×1
SYSTEM BAG RETRIEVAL 10MM (BASKET) IMPLANT
TRAP FLUID SMOKE EVACUATOR (MISCELLANEOUS) ×1 IMPLANT
TRAY FOLEY SLVR 16FR LF STAT (SET/KITS/TRAYS/PACK) ×1 IMPLANT
TROCAR Z-THREAD FIOS 5X100MM (TROCAR) ×1 IMPLANT
TUBING EVAC SMOKE HEATED PNEUM (TUBING) ×1 IMPLANT
WATER STERILE IRR 500ML POUR (IV SOLUTION) ×1 IMPLANT

## 2023-03-23 NOTE — Interval H&P Note (Signed)
History and Physical Interval Note:  03/23/2023 7:50 AM  Morgan [name]  has presented today for surgery, with the diagnosis of paraesophageal hernia.  The various methods of treatment have been discussed with the patient and family. After consideration of risks, benefits and other options for treatment, the patient has consented to  Procedure(s): XI ROBOTIC ASSISTED PARAESOPHAGEAL HERNIA REPAIR, RNFA to assist (N/A) as a surgical intervention.  The patient's history has been reviewed, patient examined, no change in status, stable for surgery.  I have reviewed the patient's chart and labs.  Questions were answered to the patient's satisfaction.     Aeric Burnham F Spenser Cong

## 2023-03-23 NOTE — Op Note (Signed)
Robotic assisted laparoscopic repair of  paraesophageal  hernia with Bio-A Mesh and partial fundoplication  Pre-operative Diagnosis: GERD, hiatal hernia  Post-operative Diagnosis: same  Procedure:  Robotic assisted laparoscopic repair of  paraesophageal  hernia with Bio-A Mesh and Partial fundoplication  Surgeon: Sterling Big, MD FACS  Assistant: Sonda Rumble RNFA,  Required due to the complexity of the case the need for exposure and lack of first assist.  Anesthesia: Gen. 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 The benefits, complications, treatment options, and expected outcomes were discussed with the patient. The risks of bleeding, infection, recurrence of symptoms, failure to resolve symptoms,  esophageal damage, Dysphagia, bowel injury, any of which could require further surgery were reviewed with the patient. The likelihood of improving the patient's symptoms with return to their baseline status is good.  The patient and/or family concurred with the proposed plan, giving informed consent.  The patient was taken to Operating Room, identified  and the procedure verified.  A Time Out was held and the above information confirmed.  Prior to the induction of general anesthesia, antibiotic prophylaxis was administered. VTE prophylaxis was in place. General endotracheal anesthesia was then administered and tolerated well. After the induction, the abdomen was prepped with Chloraprep and draped in the sterile fashion. The patient was positioned in the supine position.  Cut down technique was used to enter the abdominal cavity and a Hasson trochar was placed after two vicryl stitches were anchored to the fascia. Pneumoperitoneum was  then created with CO2 and tolerated well without any adverse changes in the patient's vital signs.  Three 8-mm ports were placed under direct vision. All skin incisions  were infiltrated with a local anesthetic agent before making the incision and placing the trocars. An additional 5 mm regular laparoscopic port was placed to assist with retraction and exposure.   The patient was positioned  in reverse Trendelenburg, robot was brought to the surgical field and docked in the standard fashion.  We made sure all the instrumentation was kept indirect view at all times and that there were no collision between the arms. I scrubbed out and went to the console.  I used a robotic arm to retract the liver, the vessel sealer on my right hand and a forced bipolar grasper on my left hand.  There is along the extra 5 mm port allow me ample exposure and the ability to perform meticulous dissection  We Started dividing the lesser omentum via the pars flaccida.  We Were able to dissect the lesser curvature of the stomach and  dissected the fundus free from the right and left crus.  We circumferentially dissected the GE junction.  The hernia sac was also completely reduced and we were able to bring the stomach into the intra-abdominal position.  Attention then was turned to the greater curvature where the short gastrics were divided with sealer device.  We were able to identify the left crus and again were able to make sure there was a good circumferential dissection and that the hernia sac was completely excised.  We did perform a good dissection within the mediastinum to allow a complete reduction of the sac,  gain esophageal length and a to completely allow an intra-abdominal fundoplication. Using two strips of Bio-A as pledgets we approximated the crus with a 2-0V stratafix suture. A bio-A 10x7 cm mesh was inserted and secured using Vistaseal.   We asked anesthesia before placing the bougies to inject Metylene blue via  OG tube, no evidence of esophageal or gastric injuries observed. We Asked anesthesia to place a 50 French bougie and this went easily.  We also observe trajectory of the bougie. 320 degree fundoplication was created with multiple 2-0 silk sutures and we placed 3 stitches taking some of the esophagus within that bite.  The fundoplication measured approximately 3-1/2 cm and he was floppy. I was very happy with the way the fundoplication laid and the repair of the hernia.  Inspection of the  upper quadrant was performed. No bleeding, bile  Or esophageal injuries leaks, or bowel injuries were noted. Robotic instruments and robotic arms were undocked in the standard fashion. All the needles were removed under direct visualization.   I scrubbed back in.  Pneumoperitoneum was released.  The periumbilical port site was closed with interrumpted 0 Vicryl sutures. 4-0 subcuticular Monocryl was used to close the skin. Liposomal marcaine was injected to all the incisions sites.  Dermabond was  applied.  The patient was then extubated and brought to the recovery room in stable condition. Sponge, lap, and needle counts were correct at closure and at the conclusion of the case.               Sterling Big, MD, FACS

## 2023-03-23 NOTE — Anesthesia Preprocedure Evaluation (Signed)
Anesthesia Evaluation  Patient identified by MRN, date of birth, ID band Patient awake    Reviewed: Allergy & Precautions, NPO status , Patient's Chart, lab work & pertinent test results  History of Anesthesia Complications (+) PONV, DIFFICULT AIRWAY and history of anesthetic complications  Airway Mallampati: III  TM Distance: <3 FB Neck ROM: Full    Dental  (+) Teeth Intact, Dental Advidsory Given   Pulmonary neg pulmonary ROS, Patient abstained from smoking., former smoker   Pulmonary exam normal breath sounds clear to auscultation       Cardiovascular Exercise Tolerance: Good hypertension, Pt. on medications (-) angina (-) Past MI and (-) Cardiac Stents Normal cardiovascular exam(-) dysrhythmias (-) Valvular Problems/Murmurs Rhythm:Regular Rate:Normal     Neuro/Psych  Headaches, neg Seizures  negative psych ROS   GI/Hepatic Neg liver ROS, hiatal hernia,GERD  Medicated,,  Endo/Other  diabetes, Type 2    Renal/GU negative Renal ROS  negative genitourinary   Musculoskeletal   Abdominal Normal abdominal exam  (+)   Peds negative pediatric ROS (+)  Hematology negative hematology ROS (+)   Anesthesia Other Findings Past Medical History: No date: Complication of anesthesia     Comment:  STATES TOLD REINTUBATION WITH HYSTERECTOMY 12/31/2021: Controlled diabetes mellitus type 2 with complications  (HCC) 2024: Crohn's disease (HCC) No date: Crohn's disease (HCC) No date: Difficult intubation     Comment:  Pt unclear of what difficulty was.  Reports it was with               2010 back surgery and with hemi-thyroidectomy.  No               problems in other surgeries. No date: GERD (gastroesophageal reflux disease) No date: Headache     Comment:  MIGRAINES - improved with Ajovy No date: History of hiatal hernia No date: Hypertension No date: PONV (postoperative nausea and vomiting) No date: Stiff neck     Comment:   mild limitations of up/down mvmt s/p neck surgery  Past Surgical History: No date: ABDOMINAL HYSTERECTOMY 03/2020: BACK SURGERY     Comment:  lower back 04/18/2018: BALLOON DILATION; N/A     Comment:  Procedure: BALLOON DILATION;  Surgeon: [name], MD;              Location: [facility];  Service: Endoscopy;                Laterality: N/A; 04/04/2021: COLONOSCOPY WITH PROPOFOL; N/A     Comment:  Procedure: COLONOSCOPY WITH BIOPSY;  Surgeon: [name], MD;  Location: [facility];  Service:               Endoscopy;  Laterality: N/A;  Latex 04/18/2018: ESOPHAGOGASTRODUODENOSCOPY (EGD) WITH PROPOFOL; N/A     Comment:  Procedure: ESOPHAGOGASTRODUODENOSCOPY (EGD) WITH               BIOPSIES;  Surgeon: [name], MD;  Location: [facility];  Service: Endoscopy;  Laterality: N/A; No date: NECK SURGERY     Comment:  FUSION C5-C7 No date: OOPHORECTOMY No date: THYROIDECTOMY; Left     Comment:  (Hemi-) 10/15/2017: TRACHELECTOMY; N/A     Comment:  Procedure: TRACHELECTOMY;  Surgeon: Schermerhorn, Ihor Austin, MD;  Location: [facility];  Service: Gynecology;                Laterality: N/A; No date: TUBAL LIGATION     Reproductive/Obstetrics negative OB ROS                             Anesthesia Physical Anesthesia Plan  ASA: 2  Anesthesia Plan: General   Post-op Pain Management:    Induction: Intravenous  PONV Risk Score and Plan: 4 or greater and Propofol infusion, TIVA, Midazolam, Ondansetron and Treatment may vary due to age or medical condition  Airway Management Planned: Oral ETT  Additional Equipment:   Intra-op Plan:   Post-operative Plan:   Informed Consent: I have reviewed the patients History and Physical, chart, labs and discussed the procedure including the risks, benefits and alternatives for the proposed anesthesia with the patient or authorized representative who  has indicated his/her understanding and acceptance.     Dental Advisory Given  Plan Discussed with: CRNA and Surgeon  Anesthesia Plan Comments:        Anesthesia Quick Evaluation

## 2023-03-23 NOTE — Plan of Care (Signed)
continue

## 2023-03-23 NOTE — Anesthesia Procedure Notes (Signed)
 Procedure Name: Intubation Date/Time: 03/23/2023 8:08 AM  Performed by: Lenard Simmer, MDPre-anesthesia Checklist: Patient identified, Emergency Drugs available, Suction available and Patient being monitored Patient Re-evaluated:Patient Re-evaluated prior to induction Oxygen Delivery Method: Circle system utilized Preoxygenation: Pre-oxygenation with 100% oxygen Induction Type: IV induction Ventilation: Mask ventilation without difficulty and Oral airway inserted - appropriate to patient size Laryngoscope Size: McGrath and 4 Grade View: Grade I Tube type: Oral Tube size: 7.0 mm Number of attempts: 1 Airway Equipment and Method: Stylet and Oral airway Placement Confirmation: ETT inserted through vocal cords under direct vision, positive ETCO2 and breath sounds checked- equal and bilateral Secured at: 20 cm Tube secured with: Tape Dental Injury: Teeth and Oropharynx as per pre-operative assessment

## 2023-03-23 NOTE — Transfer of Care (Signed)
Immediate Anesthesia Transfer of Care Note  Patient: Morgan [name]  Procedure(s) Performed: XI ROBOTIC ASSISTED PARAESOPHAGEAL HERNIA REPAIR, RNFA to assist  Patient Location: PACU  Anesthesia Type:General  Level of Consciousness: drowsy  Airway & Oxygen Therapy: Patient connected to face mask oxygen  Post-op Assessment: Report given to RN  Post vital signs: Reviewed and stable  Last Vitals:  Vitals Value Taken Time  BP 114/73 03/23/23 1045  Temp    Pulse 90 03/23/23 1046  Resp 15 03/23/23 1046  SpO2 100 % 03/23/23 1046  Vitals shown include unfiled device data.  Last Pain:  Vitals:   03/23/23 0639  TempSrc: Oral  PainSc: 0-No pain         Complications: No notable events documented.

## 2023-03-24 ENCOUNTER — Encounter: Payer: Self-pay | Admitting: Nurse Practitioner

## 2023-03-24 DIAGNOSIS — I10 Essential (primary) hypertension: Secondary | ICD-10-CM | POA: Diagnosis not present

## 2023-03-24 DIAGNOSIS — E782 Mixed hyperlipidemia: Secondary | ICD-10-CM

## 2023-03-24 DIAGNOSIS — Z87891 Personal history of nicotine dependence: Secondary | ICD-10-CM | POA: Diagnosis not present

## 2023-03-24 DIAGNOSIS — E119 Type 2 diabetes mellitus without complications: Secondary | ICD-10-CM | POA: Diagnosis not present

## 2023-03-24 DIAGNOSIS — K449 Diaphragmatic hernia without obstruction or gangrene: Secondary | ICD-10-CM | POA: Diagnosis not present

## 2023-03-24 DIAGNOSIS — K219 Gastro-esophageal reflux disease without esophagitis: Secondary | ICD-10-CM | POA: Diagnosis not present

## 2023-03-24 DIAGNOSIS — Z91040 Latex allergy status: Secondary | ICD-10-CM | POA: Diagnosis not present

## 2023-03-24 LAB — CBC
HCT: 36.1 % (ref 36.0–46.0)
Hemoglobin: 12.1 g/dL (ref 12.0–15.0)
MCH: 28 pg (ref 26.0–34.0)
MCHC: 33.5 g/dL (ref 30.0–36.0)
MCV: 83.6 fL (ref 80.0–100.0)
Platelets: 268 10*3/uL (ref 150–400)
RBC: 4.32 MIL/uL (ref 3.87–5.11)
RDW: 15.2 % (ref 11.5–15.5)
WBC: 12.2 10*3/uL — ABNORMAL HIGH (ref 4.0–10.5)
nRBC: 0 % (ref 0.0–0.2)

## 2023-03-24 LAB — BASIC METABOLIC PANEL
Anion gap: 9 (ref 5–15)
BUN: 10 mg/dL (ref 6–20)
CO2: 27 mmol/L (ref 22–32)
Calcium: 8.8 mg/dL — ABNORMAL LOW (ref 8.9–10.3)
Chloride: 104 mmol/L (ref 98–111)
Creatinine, Ser: 0.79 mg/dL (ref 0.44–1.00)
GFR, Estimated: >60 mL/min (ref >60)
Glucose, Bld: 100 mg/dL — ABNORMAL HIGH (ref 70–99)
Potassium: 3.5 mmol/L (ref 3.5–5.1)
Sodium: 140 mmol/L (ref 135–145)

## 2023-03-24 LAB — SURGICAL PATHOLOGY

## 2023-03-24 LAB — MAGNESIUM: Magnesium: 2.3 mg/dL (ref 1.7–2.4)

## 2023-03-24 MED ORDER — ENOXAPARIN SODIUM 40 MG/0.4ML IJ SOSY
PREFILLED_SYRINGE | INTRAMUSCULAR | Status: AC
Start: 2023-03-24 — End: ?
  Filled 2023-03-24: qty 0.4

## 2023-03-24 MED ORDER — ACETAMINOPHEN 500 MG PO TABS
ORAL_TABLET | ORAL | Status: AC
  Filled 2023-03-24: qty 2

## 2023-03-24 MED ORDER — KETOROLAC TROMETHAMINE 30 MG/ML IJ SOLN
INTRAMUSCULAR | Status: AC
Start: 2023-03-24 — End: ?
  Filled 2023-03-24: qty 1

## 2023-03-24 MED ORDER — GABAPENTIN 300 MG PO CAPS
ORAL_CAPSULE | ORAL | Status: AC
  Filled 2023-03-24: qty 1

## 2023-03-24 MED ORDER — OXYCODONE HCL 5 MG PO TABS: 5.0000 mg | ORAL_TABLET | Freq: Four times a day (QID) | ORAL | 0 refills | Status: DC | PRN

## 2023-03-24 MED ORDER — KETOROLAC TROMETHAMINE 30 MG/ML IJ SOLN
INTRAMUSCULAR | Status: AC
  Filled 2023-03-24: qty 1

## 2023-03-24 MED ORDER — GABAPENTIN 100 MG PO CAPS
ORAL_CAPSULE | ORAL | Status: AC
  Filled 2023-03-24: qty 1

## 2023-03-24 MED ORDER — ENOXAPARIN SODIUM 30 MG/0.3ML IJ SOSY
PREFILLED_SYRINGE | INTRAMUSCULAR | Status: AC
  Filled 2023-03-24: qty 0.3

## 2023-03-24 MED ORDER — ATORVASTATIN CALCIUM 10 MG PO TABS: 10.0000 mg | ORAL_TABLET | Freq: Every day | ORAL | 11 refills | Status: DC

## 2023-03-24 MED ORDER — ONDANSETRON 4 MG PO TBDP: 4.0000 mg | ORAL_TABLET | Freq: Four times a day (QID) | ORAL | 0 refills | Status: DC | PRN

## 2023-03-24 MED ORDER — OXYCODONE HCL 5 MG PO TABS
ORAL_TABLET | ORAL | Status: AC
  Filled 2023-03-24: qty 1

## 2023-03-24 NOTE — Plan of Care (Signed)
  Problem: Activity: Goal: Risk for activity intolerance will decrease Outcome: Progressing   

## 2023-03-24 NOTE — Discharge Summary (Signed)
[facility] SUR[facility]ICAL DISCHARGE SUMMARY  Patient ID: Morgan [name] MRN: [id number] DOB/AGE: 45/05/1975 45 y.o.  Admit date: 03/23/2023 Discharge date: 03/24/2023  Discharge Diagnoses Patient Active Problem List   Diagnosis Date Noted   Hiatal hernia 03/23/2023   S/P repair of paraesophageal hernia 03/23/2023   Dysphagia 02/18/2023    Consultants None  Procedures 03/23/2023:  Robotic assisted laparoscopic paraesophageal hernia repair with partial fundoplication   HPI: Morgan [name] is a 47 y.o. female with history of hiatal hernia who presents for repair on 12/03 with Dr [facility] Course: Informed consent was obtained and documented, and patient underwent uneventful robotic assisted laparoscopic paraesophageal hernia repair with partial fundoplication (Dr Everlene Farrier, 03/23/2023).  Post-operatively, patient did well. Advancement of patient's diet and ambulation were well-tolerated. The remainder of patient's hospital course was essentially unremarkable, and discharge planning was initiated accordingly with patient safely able to be discharged home with appropriate discharge instructions, pain control, and outpatient follow-up after all of her questions were answered to her expressed satisfaction.   Discharge Condition: Good   Physical Examination:  Constitutional: Well appearing female, NAD Pulmonary: Normal effort, no respiratory distress Gastrointestinal: Soft, incisional soreness expectedly, non-distended, no rebound/guarding  Skin: Laparoscopic incisions are CDI with dermabond, no erythema or drainage    Allergies as of 03/24/2023       Reactions   Adhesive [tape] Other (See Comments)   Bruises skin   Latex Other (See Comments)   Swelling (vaginal)   Lexapro [escitalopram Oxalate] Other (See Comments)   Loss taste   Metoprolol Other (See Comments)   Lowers heart rate   Other Nausea And Vomiting   General anesthesia    Topamax [topiramate]    Loss  taste        Medication List     TAKE these medications    Ajovy 225 MG/1.5ML Soaj Generic drug: Fremanezumab-vfrm Inject 1 Dose into the skin See admin instructions. Every 23 to 25 days   atorvastatin 10 MG tablet Commonly known as: LIPITOR TAKE 1 TABLET BY MOUTH DAILY   benazepril 10 MG tablet Commonly known as: LOTENSIN Take 1 tablet (10 mg total) by mouth daily.   clonazePAM 0.5 MG tablet Commonly known as: KLONOPIN Take 0.5 mg by mouth at bedtime.   cyclobenzaprine 10 MG tablet Commonly known as: FLEXERIL TAKE 2 TABLETS BY MOUTH AT BEDTIME What changed: how much to take   gabapentin 400 MG capsule Commonly known as: NEURONTIN Take 400-800 mg by mouth See admin instructions. Take 400 mg in the morning and 800 mg at night   nortriptyline 10 MG capsule Commonly known as: PAMELOR Take 10 mg by mouth every morning.   nortriptyline 50 MG capsule Commonly known as: PAMELOR Take 50 mg by mouth at bedtime.   omeprazole 40 MG capsule Commonly known as: PRILOSEC TAKE 1 CAPSULE BY MOUTH TWICE A DAY   ondansetron 4 MG disintegrating tablet Commonly known as: ZOFRAN-ODT Take 1 tablet (4 mg total) by mouth every 6 (six) hours as needed for nausea.   Osteo Bi-Flex Triple Strength Tabs Take 1 tablet by mouth 2 (two) times daily.   oxyCODONE 5 MG immediate release tablet Commonly known as: Oxy IR/ROXICODONE Take 1 tablet (5 mg total) by mouth every 6 (six) hours as needed for severe pain (pain score 7-10) or breakthrough pain.   Probiotic Chew Chew 2 capsules by mouth daily.   QUEtiapine 100 MG tablet Commonly known as: SEROQUEL Take 100 mg by mouth at bedtime.  rizatriptan 10 MG tablet Commonly known as: MAXALT Take 10 mg by mouth as needed for migraine. 1 tablet by mouth as directed   valACYclovir 500 MG tablet Commonly known as: VALTREX TAKE 1 TABLET (500 MG TOTAL) BY MOUTH DAILY.          Follow-up Information     Leafy Ro, MD. Go on  04/12/2023.   Specialty: General Surgery Why: Go to appointment on 12/23 at 845 AM Contact information: [address] [place] [address] [phone]                  Time spent on discharge management including discussion of hospital course, clinical condition, outpatient instructions, prescriptions, and follow up with the patient and members of the medical team: >30 minutes  -- Lynden Oxford , PA-C [facility]  03/24/2023, 10:35 AM [phone] M-F: 7am - 4pm

## 2023-03-24 NOTE — Progress Notes (Signed)
DISCHARGE NOTE:    Pt discharged with IV removed and dc instructions given. Pt expresses no questions or concerns at this time. Pt given a reusable ice pack and pt wheeled down to medical mall entrance. Pt's husband provided transportation.

## 2023-03-24 NOTE — Discharge Instructions (Signed)
In addition to included general post-operative instructions,  Diet: Recommend following Nissen diet restrictions for minimum of 4 weeks; hand out given.   Activity: No heavy lifting >20 pounds (children, pets, laundry, garbage) or strenuous activity for 4 weeks, but light activity and walking are encouraged. Do not drive or drink alcohol if taking narcotic pain medications or having pain that might distract from driving.  Wound care: 2 days after surgery (12/05), you may shower/get incision wet with soapy water and pat dry (do not rub incisions), but no baths or submerging incision underwater until follow-up.   Medications: Resume all home medications. For mild to moderate pain: acetaminophen (Tylenol) or ibuprofen/naproxen (if no kidney disease). Combining Tylenol with alcohol can substantially increase your risk of causing liver disease. Narcotic pain medications, if prescribed, can be used for severe pain, though may cause nausea, constipation, and drowsiness. Do not combine Tylenol and Percocet (or similar) within a 6 hour period as Percocet (and similar) contain(s) Tylenol. If you do not need the narcotic pain medication, you do not need to fill the prescription.  Call office [phone] / [phone]) at any time if any questions, worsening pain, fevers/chills, bleeding, drainage from incision site, or other concerns.

## 2023-03-25 ENCOUNTER — Telehealth: Payer: Self-pay | Admitting: Surgery

## 2023-03-25 ENCOUNTER — Encounter: Payer: Self-pay | Admitting: Surgery

## 2023-03-25 NOTE — Telephone Encounter (Signed)
Patient calls, she had hiatal hernia repair on 03/23/23.  She states that since yesterday evening and still again today her urine is blue/green and concerned of color.  Please call her. Thank you.

## 2023-03-25 NOTE — Telephone Encounter (Signed)
Per Dr Everlene Farrier they do use a blue dye with surgery. It should clear her system in a few days. Patient is aware.

## 2023-03-29 ENCOUNTER — Telehealth: Payer: Self-pay | Admitting: *Deleted

## 2023-03-29 NOTE — Telephone Encounter (Signed)
Faxed LOA to [place] [employer] at [phone]

## 2023-04-04 ENCOUNTER — Other Ambulatory Visit: Payer: Self-pay | Admitting: Nurse Practitioner

## 2023-04-04 DIAGNOSIS — M5416 Radiculopathy, lumbar region: Secondary | ICD-10-CM

## 2023-04-04 DIAGNOSIS — B001 Herpesviral vesicular dermatitis: Secondary | ICD-10-CM

## 2023-04-04 DIAGNOSIS — G8929 Other chronic pain: Secondary | ICD-10-CM

## 2023-04-05 NOTE — Telephone Encounter (Signed)
 Patient has follow up scheduled as requested

## 2023-04-06 ENCOUNTER — Telehealth: Payer: Self-pay | Admitting: Surgery

## 2023-04-06 NOTE — Telephone Encounter (Signed)
Patient had robotic hiatal hernia on 03/23/23 with Dr. Everlene Farrier.  Patient states that she is slowly starting with foods such as mash potatoes and can't even do that.  She is only able to eat ice pops and drink water.  Trying to take in anything else is causing a lot of pain and gas. Just wants to be able to eat something without having a lot of pain.  Please call her. Thank you.

## 2023-04-08 NOTE — Anesthesia Postprocedure Evaluation (Signed)
Anesthesia Post Note  Patient: Morgan [name]  Procedure(s) Performed: XI ROBOTIC ASSISTED PARAESOPHAGEAL HERNIA REPAIR, RNFA to assist  Patient location during evaluation: PACU Anesthesia Type: General Level of consciousness: awake and alert Pain management: pain level controlled Vital Signs Assessment: post-procedure vital signs reviewed and stable Respiratory status: spontaneous breathing, nonlabored ventilation, respiratory function stable and patient connected to nasal cannula oxygen Cardiovascular status: blood pressure returned to baseline and stable Postop Assessment: no apparent nausea or vomiting Anesthetic complications: no   No notable events documented.   Last Vitals:  Vitals:   03/24/23 0445 03/24/23 0742  BP: 126/77 134/86  Pulse: 96 100  Resp: 16 18  Temp: 36.8 C 36.9 C  SpO2: 99% 98%    Last Pain:  Vitals:   03/24/23 0742  TempSrc: Oral  PainSc: 4                  Lenard Simmer

## 2023-04-12 ENCOUNTER — Ambulatory Visit (INDEPENDENT_AMBULATORY_CARE_PROVIDER_SITE_OTHER): Payer: BC Managed Care – PPO | Admitting: Surgery

## 2023-04-12 ENCOUNTER — Encounter: Payer: Self-pay | Admitting: Surgery

## 2023-04-12 VITALS — BP 96/67 | HR 101 | Temp 98.0°F | Ht 65.0 in

## 2023-04-12 DIAGNOSIS — Z09 Encounter for follow-up examination after completed treatment for conditions other than malignant neoplasm: Secondary | ICD-10-CM

## 2023-04-12 DIAGNOSIS — K449 Diaphragmatic hernia without obstruction or gangrene: Secondary | ICD-10-CM

## 2023-04-12 MED ORDER — CYCLOBENZAPRINE HCL 5 MG PO TABS: 5.0000 mg | ORAL_TABLET | Freq: Three times a day (TID) | ORAL | 0 refills | Status: DC | PRN

## 2023-04-12 NOTE — Progress Notes (Signed)
Outpatient Surgical Follow Up  04/12/2023  Morgan [name] is an 47 y.o. female.   Chief Complaint  Patient presents with   Routine Post Op    HPI: she is 2 1/2 weeks from rob repair HH. Doing ok, some pains, swallowing well, no reflux, no fevers or chills, tolerating po. SOme distension .  Past Medical History:  Diagnosis Date   Complication of anesthesia    STATES TOLD REINTUBATION WITH HYSTERECTOMY   Controlled diabetes mellitus type 2 with complications (HCC) 12/31/2021   diet controlled   Crohn's disease (HCC) 2024   Crohn's disease (HCC)    Difficult intubation    Pt unclear of what difficulty was.  Reports it was with 2010 back surgery and with hemi-thyroidectomy.  No problems in other surgeries.   GERD (gastroesophageal reflux disease)    Headache    MIGRAINES - improved with Ajovy   History of hiatal hernia    Hypertension    PONV (postoperative nausea and vomiting)    sick every time she has anesthesia   Stiff neck    mild limitations of up/down mvmt s/p neck surgery    Past Surgical History:  Procedure Laterality Date   ABDOMINAL HYSTERECTOMY     BACK SURGERY  03/2020   lower back   BALLOON DILATION N/A 04/18/2018   Procedure: BALLOON DILATION;  Surgeon: [name], MD;  Location: [facility];  Service: Endoscopy;  Laterality: N/A;   BIOPSY  02/19/2023   Procedure: BIOPSY;  Surgeon: Wyline Mood, MD;  Location: [facility] ENDOSCOPY;  Service: Gastroenterology;;   COLONOSCOPY WITH PROPOFOL N/A 04/04/2021   Procedure: COLONOSCOPY WITH BIOPSY;  Surgeon: [name], MD;  Location: [facility] SUR[facility];  Service: Endoscopy;  Laterality: N/A;  Latex   COLONOSCOPY WITH PROPOFOL N/A 02/19/2023   Procedure: COLONOSCOPY WITH PROPOFOL;  Surgeon: Wyline Mood, MD;  Location: [facility] ENDOSCOPY;  Service: Gastroenterology;  Laterality: N/A;   ESOPHAGOGASTRODUODENOSCOPY (EGD) WITH PROPOFOL N/A 04/18/2018   Procedure: ESOPHAGOGASTRODUODENOSCOPY (EGD) WITH BIOPSIES;  Surgeon:  [name], MD;  Location: [facility] SURGERY CNTR;  Service: Endoscopy;  Laterality: N/A;   ESOPHAGOGASTRODUODENOSCOPY (EGD) WITH PROPOFOL N/A 02/18/2023   Procedure: ESOPHAGOGASTRODUODENOSCOPY (EGD) WITH PROPOFOL;  Surgeon: [name], MD;  Location: [facility] ENDOSCOPY;  Service: Endoscopy;  Laterality: N/A;   NECK SURGERY     FUSION C5-C7   OOPHORECTOMY     THYROIDECTOMY Left    (Hemi-)   TRACHELECTOMY N/A 10/15/2017   Procedure: TRACHELECTOMY;  Surgeon: Schermerhorn, Ihor Austin, MD;  Location: [facility] ORS;  Service: Gynecology;  Laterality: N/A;   TUBAL LIGATION     XI ROBOTIC ASSISTED PARAESOPHAGEAL HERNIA REPAIR N/A 03/23/2023   Procedure: XI ROBOTIC ASSISTED PARAESOPHAGEAL HERNIA REPAIR, RNFA to assist;  Surgeon: Leafy Ro, MD;  Location: [facility] ORS;  Service: General;  Laterality: N/A;    Family History  Problem Relation Age of Onset   Hypertension Mother    Arthritis Mother    Lung disease Mother    Arthritis Father    Stroke Father    Hypertension Father    Arthritis Maternal Grandmother    Heart disease Maternal Grandmother    Hypertension Maternal Grandmother    Arthritis Maternal Grandfather    Lung cancer Maternal Grandfather    Heart disease Maternal Grandfather    Arthritis Paternal Grandmother    Heart disease Paternal Grandmother    Hypertension Paternal Grandmother    Arthritis Paternal Grandfather    Heart disease Paternal Grandfather    Stroke Paternal Grandfather  Heart disease Maternal Uncle     Social History:  reports that she quit smoking about 6 years ago. Her smoking use included cigarettes. She started smoking about 31 years ago. She has a 50 pack-year smoking history. She has been exposed to tobacco smoke. She has never used smokeless tobacco. She reports that she does not currently use alcohol. She reports that she does not use drugs.  Allergies:  Allergies  Allergen Reactions   Adhesive [Tape] Other (See Comments)    Bruises skin   Latex Other  (See Comments)    Swelling (vaginal)   Lexapro [Escitalopram Oxalate] Other (See Comments)    Loss taste   Metoprolol Other (See Comments)    Lowers heart rate   Other Nausea And Vomiting    General anesthesia    Topamax [Topiramate]     Loss taste    Medications reviewed.    ROS Full ROS performed and is otherwise negative other than what is stated in HPI   BP 96/67   Pulse (!) 101   Temp 98 F (36.7 C)   Ht 5\' 5"  (1.651 m)   SpO2 97%   BMI 26.63 kg/m   Physical Exam NAD alert Abd: soft, incision c/d/I, no peritonitis   Assessment/Plan: Doing well overall Some muscle spasm added flexeril Continue nissen diet RTC 2-3 months No evidence of complications   Sterling Big, MD FACS General Surgeon

## 2023-04-12 NOTE — Patient Instructions (Addendum)
You may try Phazyme for the gas..  We have sent in a prescription for 5 mg Flexeril to be taken as needed.   Follow up here in 2 months.   GENERAL POST-OPERATIVE PATIENT INSTRUCTIONS   WOUND CARE INSTRUCTIONS:  Keep a dry clean dressing on the wound if there is drainage. The initial bandage may be removed after 24 hours.  Once the wound has quit draining you may leave it open to air.  If clothing rubs against the wound or causes irritation and the wound is not draining you may cover it with a dry dressing during the daytime.  Try to keep the wound dry and avoid ointments on the wound unless directed to do so.  If the wound becomes bright red and painful or starts to drain infected material that is not clear, please contact your physician immediately.  If the wound is mildly pink and has a thick firm ridge underneath it, this is normal, and is referred to as a healing ridge.  This will resolve over the next 4-6 weeks.  BATHING: You may shower if you have been informed of this by your surgeon. However, Please do not submerge in a tub, hot tub, or pool until incisions are completely sealed or have been told by your surgeon that you may do so.  DIET:  You may eat any foods that you can tolerate.  It is a good idea to eat a high fiber diet and take in plenty of fluids to prevent constipation.  If you do become constipated you may want to take a mild laxative or take ducolax tablets on a daily basis until your bowel habits are regular.  Constipation can be very uncomfortable, along with straining, after recent surgery.  ACTIVITY:  You are encouraged to cough and deep breath or use your incentive spirometer if you were given one, every 15-30 minutes when awake.  This will help prevent respiratory complications and low grade fevers post-operatively if you had a general anesthetic.  You may want to hug a pillow when coughing and sneezing to add additional support to the surgical area, if you had abdominal or  chest surgery, which will decrease pain during these times.  You are encouraged to walk and engage in light activity for the next two weeks.  You should not lift more than 20 pounds for 6 weeks total after surgery as it could put you at increased risk for complications.  Twenty pounds is roughly equivalent to a plastic bag of groceries. At that time- Listen to your body when lifting, if you have pain when lifting, stop and then try again in a few days. Soreness after doing exercises or activities of daily living is normal as you get back in to your normal routine.  MEDICATIONS:  Try to take narcotic medications and anti-inflammatory medications, such as tylenol, ibuprofen, naprosyn, etc., with food.  This will minimize stomach upset from the medication.  Should you develop nausea and vomiting from the pain medication, or develop a rash, please discontinue the medication and contact your physician.  You should not drive, make important decisions, or operate machinery when taking narcotic pain medication.  SUNBLOCK Use sun block to incision area over the next year if this area will be exposed to sun. This helps decrease scarring and will allow you avoid a permanent darkened area over your incision.  QUESTIONS:  Please feel free to call our office if you have any questions, and we will be glad to assist  you. [phone]

## 2023-05-24 ENCOUNTER — Ambulatory Visit (INDEPENDENT_AMBULATORY_CARE_PROVIDER_SITE_OTHER): Payer: BC Managed Care – PPO | Admitting: Surgery

## 2023-05-24 ENCOUNTER — Encounter: Payer: Self-pay | Admitting: Surgery

## 2023-05-24 VITALS — BP 132/84 | HR 103 | Temp 98.3°F | Ht 65.0 in | Wt 155.4 lb

## 2023-05-24 DIAGNOSIS — Z09 Encounter for follow-up examination after completed treatment for conditions other than malignant neoplasm: Secondary | ICD-10-CM

## 2023-05-24 DIAGNOSIS — K449 Diaphragmatic hernia without obstruction or gangrene: Secondary | ICD-10-CM

## 2023-05-24 NOTE — Patient Instructions (Signed)
Surgery to Stop Reflux (Laparoscopic Nissen Fundoplication): What to Know After After having a surgery called laparoscopic Nissen fundoplication, it's common to have pain when you swallow. You may also have: Trouble swallowing. Soreness in your belly or chest. Bloating. Follow these instructions at home: Medicines Take your medicines only as told. Ask your health care provider if you can crush any pill that you need to take. Take only liquid medicines as told. Ask your provider if it's safe to drive or use machines while taking your medicine. Eating and drinking Eat and drink as told. You may need to: Only have liquids for 2 weeks. After that, you may need to only have soft foods for 2 weeks. Eat slowly. Take small bites. Chew your food well. Eat or drink only while upright. Go back to your normal diet slowly. Drink more fluids as told. Caring for your cuts from surgery  Take care of your cuts from surgery as told. Make sure you: Wash your hands with soap and water for at least 20 seconds before and after you change your bandage. If you can't use soap and water, use hand sanitizer. Change your bandage. Leave stitches or skin glue alone. Leave tape strips alone unless you're told to take them off. You may trim the edges of the tape strips if they curl up. Check the area around your cuts every day for signs of infection. Check for: More redness, swelling, or pain. More fluid or blood. Warmth. Pus or a bad smell. Activity If you were given a sedative, do not drive or use machines until you're told it's safe. A sedative can make you sleepy. Rest as told. Get up to take short walks at least every 2 hours many times during the day. This helps you breathe better and keeps your blood flowing. Ask for help if you feel weak or unsteady. Do not lift anything heavier than 10 lb (4.5 kg) until you're told it's OK. This may be after about 6 weeks. Try not to do things that take a lot of  effort. Ask when you can have sex again. Ask what things are safe for you to do at home. Ask when you can go back to work or school. General instructions Do not take baths, swim, or use a hot tub until you're told it's OK. Ask if you can shower. Do not smoke, vape, or use nicotine or tobacco. Doing this can slow down healing. Your provider may give you more instructions. Make sure you know what you can and can't do. Contact a health care provider if: You have chills or a fever. You have more trouble swallowing. You have painful bloating. You have heartburn that won't go away. Your pain doesn't get better with medicine. You throw up a lot or often feel like you may throw up. A cut from surgery opens up. You have any signs of infection. Get help right away if: You have signs of a perforation. These include: Very bad pain. Throwing up and not being able to stop. A fever. A heart beat that's too fast. You have very bad pain or bloating. You keep throwing up and can't stop. You have blood in your throw up. You have chest pain or trouble breathing. These symptoms may be an emergency. Call 911 right away. Do not wait to see if the symptoms will go away. Do not drive yourself to the hospital. This information is not intended to replace advice given to you by your health care provider. Make  sure you discuss any questions you have with your health care provider. Document Revised: 11/26/2022 Document Reviewed: 11/26/2022 Elsevier Patient Education  2024 [employer].

## 2023-05-25 NOTE — Progress Notes (Signed)
Outpatient Surgical Follow Up    Morgan [name] is an 48 y.o. female.   Chief Complaint  Patient presents with   Routine Post Op    Paraesophageal hernia repair 03/23/23    HPI: Morgan [name] is 2 months following repair HH.  She is doing very well.  No dysphagia no reflux and no she is very happy.  She is tolerating diet but she does report having to have smaller portions.  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  Reports it was with 2010 back surgery and with hemi-thyroidectomy.  No problems in other surgeries.   GERD (gastroesophageal reflux disease)    Headache    MIGRAINES - improved with Ajovy   History of hiatal hernia    Hypertension    PONV (postoperative nausea and vomiting)    sick every time she has anesthesia   Stiff neck    mild limitations of up/down mvmt s/p neck surgery    Past Surgical History:  Procedure Laterality Date   ABDOMINAL HYSTERECTOMY     BACK SURGERY  03/2020   lower back   BALLOON DILATION N/A 04/18/2018   Procedure: BALLOON DILATION;  Surgeon: [name], MD;  Location: [facility];  Service: Endoscopy;  Laterality: N/A;   BIOPSY  02/19/2023   Procedure: BIOPSY;  Surgeon: Wyline Mood, MD;  Location: [facility] ENDOSCOPY;  Service: Gastroenterology;;   COLONOSCOPY WITH PROPOFOL N/A 04/04/2021   Procedure: COLONOSCOPY WITH BIOPSY;  Surgeon: [name], MD;  Location: [facility] SURGERY CNTR;  Service: Endoscopy;  Laterality: N/A;  Latex   COLONOSCOPY WITH PROPOFOL N/A 02/19/2023   Procedure: COLONOSCOPY WITH PROPOFOL;  Surgeon: Wyline Mood, MD;  Location: [facility] ENDOSCOPY;  Service: Gastroenterology;  Laterality: N/A;    ESOPHAGOGASTRODUODENOSCOPY (EGD) WITH PROPOFOL N/A 04/18/2018   Procedure: ESOPHAGOGASTRODUODENOSCOPY (EGD) WITH BIOPSIES;  Surgeon: [name], MD;  Location: [facility] SURGERY CNTR;  Service: Endoscopy;  Laterality: N/A;   ESOPHAGOGASTRODUODENOSCOPY (EGD) WITH PROPOFOL N/A 02/18/2023   Procedure: ESOPHAGOGASTRODUODENOSCOPY (EGD) WITH PROPOFOL;  Surgeon: [name], MD;  Location: [facility] ENDOSCOPY;  Service: Endoscopy;  Laterality: N/A;   NECK SURGERY     FUSION C5-C7   OOPHORECTOMY     THYROIDECTOMY Left    (Hemi-)   TRACHELECTOMY N/A 10/15/2017   Procedure: TRACHELECTOMY;  Surgeon: Schermerhorn, Ihor Austin, MD;  Location: [facility] ORS;  Service: Gynecology;  Laterality: N/A;   TUBAL LIGATION     XI ROBOTIC ASSISTED PARAESOPHAGEAL HERNIA REPAIR N/A 03/23/2023   Procedure: XI ROBOTIC ASSISTED PARAESOPHAGEAL HERNIA REPAIR, RNFA to assist;  Surgeon: Leafy Ro, MD;  Location: [facility] ORS;  Service: General;  Laterality: N/A;    Family History  Problem Relation Age of Onset   Hypertension Mother    Arthritis Mother    Lung disease Mother    Arthritis Father    Stroke Father    Hypertension Father    Arthritis Maternal Grandmother    Heart disease Maternal Grandmother    Hypertension Maternal Grandmother    Arthritis Maternal Grandfather    Lung cancer Maternal Grandfather    Heart disease Maternal Grandfather    Arthritis Paternal Grandmother  Heart disease Paternal Grandmother    Hypertension Paternal Grandmother    Arthritis Paternal Grandfather    Heart disease Paternal Grandfather    Stroke Paternal Grandfather    Heart disease Maternal Uncle     Social History:  reports that she quit smoking about 6 years ago. Her smoking use included cigarettes. She started smoking about 31 years ago. She has a 50 pack-year smoking history. She has been exposed to tobacco smoke. She has never used smokeless tobacco. She reports that she does not currently use alcohol. She reports that she does  not use drugs.  Allergies:  Allergies  Allergen Reactions   Adhesive [Tape] Other (See Comments)    Bruises skin   Latex Other (See Comments)    Swelling (vaginal)   Lexapro [Escitalopram Oxalate] Other (See Comments)    Loss taste   Metoprolol Other (See Comments)    Lowers heart rate   Other Nausea And Vomiting    General anesthesia    Topamax [Topiramate]     Loss taste    Medications reviewed.    ROS Full ROS performed and is otherwise negative other than what is stated in HPI   BP 132/84   Pulse (!) 103   Temp 98.3 F (36.8 C) (Oral)   Ht 5\' 5"  (1.651 m)   Wt 155 lb 6.4 oz (70.5 kg)   SpO2 99%   BMI 25.86 kg/m   Physical Exam  NAD alert [id number], nt, incisions healed, no infection   Assessment/Plan: Very well after paraesophageal hernia.  No evidence of complications.  Some expected side effects including mild bloating that should improve with time.  I did reassure her about benign findings.  RTC as needed   Sterling Big, MD [facility] General Surgeon

## 2023-06-14 ENCOUNTER — Encounter: Payer: BC Managed Care – PPO | Admitting: Surgery

## 2023-07-05 ENCOUNTER — Ambulatory Visit: Payer: Self-pay | Admitting: Nurse Practitioner

## 2023-07-05 ENCOUNTER — Ambulatory Visit (INDEPENDENT_AMBULATORY_CARE_PROVIDER_SITE_OTHER)
Admission: RE | Admit: 2023-07-05 | Discharge: 2023-07-05 | Disposition: A | Discharge status: Home / Self Care | Source: Ambulatory Visit | Attending: Internal Medicine | Admitting: Internal Medicine

## 2023-07-05 ENCOUNTER — Encounter: Payer: Self-pay | Admitting: Internal Medicine

## 2023-07-05 ENCOUNTER — Ambulatory Visit (INDEPENDENT_AMBULATORY_CARE_PROVIDER_SITE_OTHER): Admitting: Internal Medicine

## 2023-07-05 VITALS — BP 100/70 | HR 120 | Temp 100.6°F | Ht 65.0 in | Wt 154.0 lb

## 2023-07-05 DIAGNOSIS — R509 Fever, unspecified: Secondary | ICD-10-CM | POA: Diagnosis not present

## 2023-07-05 DIAGNOSIS — R059 Cough, unspecified: Secondary | ICD-10-CM | POA: Diagnosis not present

## 2023-07-05 DIAGNOSIS — J22 Unspecified acute lower respiratory infection: Secondary | ICD-10-CM | POA: Insufficient documentation

## 2023-07-05 LAB — POC COVID19 BINAXNOW: SARS Coronavirus 2 Ag: NEGATIVE

## 2023-07-05 MED ORDER — PREDNISONE 20 MG PO TABS: 40.0000 mg | ORAL_TABLET | Freq: Every day | ORAL | 0 refills | Status: DC

## 2023-07-05 MED ORDER — DOXYCYCLINE HYCLATE 100 MG PO TABS: 100.0000 mg | ORAL_TABLET | Freq: Two times a day (BID) | ORAL | 0 refills | Status: DC

## 2023-07-05 NOTE — Progress Notes (Signed)
 Subjective:    Patient ID: Morgan [name], female    DOB: 1976/04/06, 48 y.o.   MRN: [id number]  HPI Here due to respiratory illness  Started with headache 5 days ago Taking her migraine meds--didn't help Fever 100-102 for 2 days Chest is burning ?nasal congestion--feels like it Sore throat from the cough Some sputum--but dry in the past few days Some SOB--and has been in bed for 2 days  Hasn't tried tylenol--afraid with her colitis Honey cough drops--some help  Current Outpatient Medications on File Prior to Visit  Medication Sig Dispense Refill   AJOVY 225 MG/1.5ML SOAJ Inject 1 Dose into the skin See admin instructions. Every 23 to 25 days     atorvastatin (LIPITOR) 10 MG tablet Take 1 tablet (10 mg total) by mouth daily. 30 tablet 11   benazepril (LOTENSIN) 10 MG tablet Take 1 tablet (10 mg total) by mouth daily. 90 tablet 3   clonazePAM (KLONOPIN) 0.5 MG tablet Take 0.5 mg by mouth at bedtime.     cyclobenzaprine (FLEXERIL) 10 MG tablet TAKE 2 TABLETS BY MOUTH AT BEDTIME 180 tablet 1   gabapentin (NEURONTIN) 400 MG capsule Take 400-800 mg by mouth See admin instructions. Take 400 mg in the morning and 800 mg at night     Misc Natural Products (OSTEO BI-FLEX TRIPLE STRENGTH) TABS Take 1 tablet by mouth 2 (two) times daily.     nortriptyline (PAMELOR) 10 MG capsule Take 10 mg by mouth every morning.     nortriptyline (PAMELOR) 50 MG capsule Take 50 mg by mouth at bedtime.     omeprazole (PRILOSEC) 40 MG capsule TAKE 1 CAPSULE BY MOUTH TWICE A DAY 180 capsule 3   ondansetron (ZOFRAN-ODT) 4 MG disintegrating tablet Take 1 tablet (4 mg total) by mouth every 6 (six) hours as needed for nausea. 20 tablet 0   Probiotic CHEW Chew 2 capsules by mouth daily.     QUEtiapine (SEROQUEL) 100 MG tablet Take 100 mg by mouth at bedtime.     valACYclovir (VALTREX) 500 MG tablet TAKE 1 TABLET (500 MG TOTAL) BY MOUTH DAILY. 90 tablet 1   rizatriptan (MAXALT) 10 MG tablet Take 10 mg by mouth as  needed for migraine. 1 tablet by mouth as directed     No current facility-administered medications on file prior to visit.    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  Reports it was with 2010 back surgery and with hemi-thyroidectomy.  No problems in other surgeries.   GERD (gastroesophageal reflux disease)    Headache    MIGRAINES - improved with Ajovy   History of hiatal hernia    Hypertension    PONV (postoperative nausea and vomiting)    sick every time she  has anesthesia   Stiff neck    mild limitations of up/down mvmt s/p neck surgery    Past Surgical History:  Procedure Laterality Date   ABDOMINAL HYSTERECTOMY     BACK SURGERY  03/2020   lower back   BALLOON DILATION N/A 04/18/2018   Procedure: BALLOON DILATION;  Surgeon: [name], MD;  Location: [facility];  Service: Endoscopy;  Laterality: N/A;   BIOPSY  02/19/2023   Procedure: BIOPSY;  Surgeon: Wyline Mood, MD;  Location: [facility] ENDOSCOPY;  Service: Gastroenterology;;   COLONOSCOPY WITH PROPOFOL N/A 04/04/2021   Procedure: COLONOSCOPY WITH BIOPSY;  Surgeon: [name], MD;  Location: [facility];  Service: Endoscopy;  Laterality: N/A;  Latex   COLONOSCOPY WITH PROPOFOL N/A 02/19/2023   Procedure: COLONOSCOPY WITH PROPOFOL;  Surgeon: Wyline Mood, MD;  Location: [facility]  ENDOSCOPY;  Service: Gastroenterology;  Laterality: N/A;   ESOPHAGOGASTRODUODENOSCOPY (EGD) WITH PROPOFOL N/A 04/18/2018   Procedure: ESOPHAGOGASTRODUODENOSCOPY (EGD) WITH BIOPSIES;  Surgeon: [name], MD;  Location: [facility];  Service: Endoscopy;  Laterality: N/A;   ESOPHAGOGASTRODUODENOSCOPY (EGD) WITH PROPOFOL N/A 02/18/2023   Procedure: ESOPHAGOGASTRODUODENOSCOPY (EGD) WITH PROPOFOL;  Surgeon: [name], MD;  Location: [facility] ENDOSCOPY;  Service: Endoscopy;  Laterality: N/A;   NECK SURGERY     FUSION C5-C7   OOPHORECTOMY     THYROIDECTOMY Left    (Hemi-)   TRACHELECTOMY N/A 10/15/2017   Procedure: TRACHELECTOMY;  Surgeon: Schermerhorn, Ihor Austin, MD;  Location: [facility] ORS;  Service: Gynecology;  Laterality: N/A;   TUBAL LIGATION     XI ROBOTIC ASSISTED PARAESOPHAGEAL HERNIA REPAIR N/A 03/23/2023   Procedure: XI ROBOTIC ASSISTED PARAESOPHAGEAL HERNIA REPAIR, RNFA to assist;  Surgeon: Leafy Ro, MD;  Location: [facility] ORS;  Service: General;  Laterality: N/A;    Family History  Problem Relation Age of Onset   Hypertension Mother    Arthritis Mother    Lung disease Mother    Arthritis Father    Stroke Father    Hypertension Father    Arthritis Maternal Grandmother    Heart disease Maternal Grandmother    Hypertension Maternal Grandmother    Arthritis Maternal Grandfather    Lung cancer Maternal Grandfather    Heart disease Maternal Grandfather    Arthritis Paternal Grandmother    Heart disease Paternal Grandmother    Hypertension Paternal Grandmother    Arthritis Paternal Grandfather    Heart disease Paternal Grandfather    Stroke Paternal Grandfather    Heart disease Maternal Uncle     Social History   Socioeconomic History   Marital status: Married    Spouse name: Not on file   Number of children: Not on file   Years of education: Not on file   Highest education level: Not on file  Occupational History   Not on file  Tobacco Use   Smoking status:  Former    Current packs/day: 0.00    Average packs/day: 2.0 packs/day for 25.0 years (50.0 ttl pk-yrs)    Types: Cigarettes    Start date: 10/01/1991    Quit date: 09/30/2016    Years since quitting: 6.7    Passive exposure: Past   Smokeless tobacco: Never  Vaping Use   Vaping status: Never Used  Substance and Sexual Activity   Alcohol use: Not Currently    Comment: occasional   Drug use: No   Sexual activity: Yes  Other Topics Concern   Not on file  Social History Narrative   Not on file  Social Drivers of Corporate investment banker Strain: Not on file  Food Insecurity: No Food Insecurity (03/23/2023)   Hunger Vital Sign    Worried About Running Out of Food in the Last Year: Never true    Ran Out of Food in the Last Year: Never true  Transportation Needs: No Transportation Needs (03/23/2023)   PRAPARE - Administrator, Civil Service (Medical): No    Lack of Transportation (Non-Medical): No  Physical Activity: Not on file  Stress: Not on file  Social Connections: Not on file  Intimate Partner Violence: Not At Risk (03/23/2023)   Humiliation, Afraid, Rape, and Kick questionnaire    Fear of Current or Ex-Partner: No    Emotionally Abused: No    Physically Abused: No    Sexually Abused: No   Review of Systems Bad smell and taste since COVID 3 years ago Some nausea--no vomiting No appetite--barely had soup and jello    Objective:   Physical Exam Constitutional:      Comments: Appears mildly ill but no distress  HENT:     Head:     Comments: Mild maxillary tenderness    Right Ear: Tympanic membrane and ear canal normal.     Left Ear: Tympanic membrane and ear canal normal.     Mouth/Throat:     Pharynx: No oropharyngeal exudate or posterior oropharyngeal erythema.  Pulmonary:     Effort: Pulmonary effort is normal.     Breath sounds: Normal breath sounds. No wheezing or rales.     Comments: Coarse racking cough Musculoskeletal:     Cervical back: Neck  supple.  Lymphadenopathy:     Cervical: No cervical adenopathy.  Neurological:     Mental Status: She is alert.            Assessment & Plan:

## 2023-07-05 NOTE — Telephone Encounter (Signed)
I will check her at today's visit

## 2023-07-05 NOTE — Addendum Note (Signed)
 Addended by: Eual Fines on: 07/05/2023 04:53 PM   Modules accepted: Orders

## 2023-07-05 NOTE — Telephone Encounter (Signed)
 Red Word that prompted transfer to Nurse Triage: Patient has a fever, coughing, chest pain, feels like lungs are on fire, fatigue, not able to eat much, dizziness. Please advise [phone].     Chief Complaint: Cough, fever Symptoms: Above Frequency: Yesterday Pertinent Negatives: Patient denies  Disposition: [] ED /[] Urgent Care (no appt availability in office) / [x] Appointment(In office/virtual)/ []  [facility]/ [] Home Care/ [] Refused Recommended Disposition /[] [facility]/ []  Follow-up with PCP Additional Notes: Agrees with appointment.  Reason for Disposition  [1] MILD difficulty breathing (e.g., minimal/no SOB at rest, SOB with walking, pulse <100) AND [2] still present when not coughing  Answer Assessment - Initial Assessment Questions 1. ONSET: "When did the cough begin?"      Yesterday 2. SEVERITY: "How bad is the cough today?"      Severe 3. SPUTUM: "Describe the color of your sputum" (none, dry cough; clear, white, yellow, green)     Unsure 4. HEMOPTYSIS: "Are you coughing up any blood?" If so ask: "How much?" (flecks, streaks, tablespoons, etc.)     No 5. DIFFICULTY BREATHING: "Are you having difficulty breathing?" If Yes, ask: "How bad is it?" (e.g., mild, moderate, severe)    - MILD: No SOB at rest, mild SOB with walking, speaks normally in sentences, can lie down, no retractions, pulse < 100.    - MODERATE: SOB at rest, SOB with minimal exertion and prefers to sit, cannot lie down flat, speaks in phrases, mild retractions, audible wheezing, pulse 100-120.    - SEVERE: Very SOB at rest, speaks in single words, struggling to breathe, sitting hunched forward, retractions, pulse > 120      Mild 6. FEVER: "Do you have a fever?" If Yes, ask: "What is your temperature, how was it measured, and when did it start?"     100-102 7. CARDIAC HISTORY: "Do you have any history of heart disease?" (e.g., heart attack, congestive heart failure)      No 8. LUNG  HISTORY: "Do you have any history of lung disease?"  (e.g., pulmonary embolus, asthma, emphysema)     No 9. PE RISK FACTORS: "Do you have a history of blood clots?" (or: recent major surgery, recent prolonged travel, bedridden)     No 10. OTHER SYMPTOMS: "Do you have any other symptoms?" (e.g., runny nose, wheezing, chest pain)       Lungs hurt 11. PREGNANCY: "Is there any chance you are pregnant?" "When was your last menstrual period?"       No 12. TRAVEL: "Have you traveled out of the country in the last month?" (e.g., travel history, exposures)       No  Protocols used: Cough - Acute Productive-A-AH

## 2023-07-05 NOTE — Assessment & Plan Note (Addendum)
 Some head congestion and mild sinus symptoms--but mostly SOB and coarse cough COVID negative Will check CXR---some increased markings/atelectasis but no lobar findings Likely viral but will give doxycycline 100 bid x 7 days  just in case Prednisone 40mg  daily x 3 then 20mg  daily for 3 days OOW till 3/19 (hopefully)

## 2023-07-06 ENCOUNTER — Encounter: Payer: Self-pay | Admitting: Internal Medicine

## 2023-07-09 ENCOUNTER — Encounter: Payer: Self-pay | Admitting: Internal Medicine

## 2023-07-09 MED ORDER — BENZONATATE 200 MG PO CAPS: 200.0000 mg | ORAL_CAPSULE | Freq: Three times a day (TID) | ORAL | 0 refills | Status: DC | PRN

## 2023-07-28 ENCOUNTER — Ambulatory Visit: Payer: BC Managed Care – PPO | Admitting: Nurse Practitioner

## 2023-07-28 ENCOUNTER — Ambulatory Visit: Admitting: Nurse Practitioner

## 2023-08-09 ENCOUNTER — Ambulatory Visit (INDEPENDENT_AMBULATORY_CARE_PROVIDER_SITE_OTHER): Admitting: Nurse Practitioner

## 2023-08-09 VITALS — BP 112/82 | HR 102 | Temp 98.6°F | Ht 65.0 in | Wt 159.0 lb

## 2023-08-09 DIAGNOSIS — G43709 Chronic migraine without aura, not intractable, without status migrainosus: Secondary | ICD-10-CM | POA: Diagnosis not present

## 2023-08-09 DIAGNOSIS — E118 Type 2 diabetes mellitus with unspecified complications: Secondary | ICD-10-CM | POA: Diagnosis not present

## 2023-08-09 DIAGNOSIS — R413 Other amnesia: Secondary | ICD-10-CM | POA: Diagnosis not present

## 2023-08-09 DIAGNOSIS — I10 Essential (primary) hypertension: Secondary | ICD-10-CM | POA: Diagnosis not present

## 2023-08-09 DIAGNOSIS — F419 Anxiety disorder, unspecified: Secondary | ICD-10-CM | POA: Diagnosis not present

## 2023-08-09 DIAGNOSIS — G479 Sleep disorder, unspecified: Secondary | ICD-10-CM | POA: Diagnosis not present

## 2023-08-09 LAB — POCT GLYCOSYLATED HEMOGLOBIN (HGB A1C): Hemoglobin A1C: 6.4 % — AB (ref 4.0–5.6)

## 2023-08-09 NOTE — Assessment & Plan Note (Signed)
 Patient currently maintained on benazepril  10 mg daily.  Blood pressure well-controlled.  Continue taking medication as prescribed

## 2023-08-09 NOTE — Progress Notes (Signed)
 Established Patient Office Visit  Subjective   Patient ID: Morgan [name], female    DOB: [birth year]  Age: 48 y.o. MRN: [id number]  Chief Complaint  Patient presents with   6 Month Diabetes Follow-up    Checks blood sugar periodically. Has been doing better with diet.    HPI  DM2: patient is currently maintained on lifestyle modifications solely. She does check her blood sugar at home intermittently. States that she will get 130s-140s after coffee. State that she has stopped creamer and using milk. She has cut ouf the doubles hot starbucks that was 3 a day. She is doing water  and gatroade  States that she power walks daily for approx 10-15 mins    HTN: Patient currently maintained on benazepril  10 mg daily.    Review of Systems  Constitutional:  Negative for chills and fever.  Respiratory:  Negative for shortness of breath.   Cardiovascular:  Negative for chest pain.  Gastrointestinal:  Positive for constipation. Negative for abdominal pain, nausea and vomiting.  Neurological:  Positive for headaches. Negative for dizziness.  Psychiatric/Behavioral:  Negative for hallucinations and suicidal ideas.       Objective:     BP 112/82 (BP Location: Left Arm, Patient Position: Sitting, Cuff Size: Normal)   Pulse (!) 102   Temp 98.6 F (37 C) (Oral)   Ht 5\' 5"  (1.651 m)   Wt 159 lb (72.1 kg)   SpO2 97%   BMI 26.46 kg/m  BP Readings from Last 3 Encounters:  08/09/23 112/82  07/05/23 100/70  05/24/23 132/84   Wt Readings from Last 3 Encounters:  08/09/23 159 lb (72.1 kg)  07/05/23 154 lb (69.9 kg)  05/24/23 155 lb 6.4 oz (70.5 kg)   SpO2 Readings from Last 3 Encounters:  08/09/23 97%  07/05/23 98%  05/24/23 99%      Physical Exam Vitals and nursing note reviewed.  Constitutional:      Appearance: Normal appearance.  Cardiovascular:     Rate and Rhythm: Normal rate and regular rhythm.     Heart sounds: Normal heart sounds.  Pulmonary:     Effort: Pulmonary  effort is normal.     Breath sounds: Normal breath sounds.  Abdominal:     General: Bowel sounds are normal.  Neurological:     Mental Status: She is alert.      Results for orders placed or performed in visit on 08/09/23  HgB A1c  Result Value Ref Range   Hemoglobin A1C 6.4 (A) 4.0 - 5.6 %   HbA1c POC (<> result, manual entry)     HbA1c, POC (prediabetic range)     HbA1c, POC (controlled diabetic range)        The 10-year ASCVD risk score (Arnett DK, et al., 2019) is: 1.4%    Assessment & Plan:   Problem List Items Addressed This Visit       Cardiovascular and Mediastinum   HTN (hypertension)   Patient currently maintained on benazepril  10 mg daily.  Blood pressure well-controlled.  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  Patient's A1c is 6.4% which is elevation since last time.  Mentions that her mother-in-law was making lots of desserts and that she has had [place] ice cream recently.  Will continue working on healthy lifestyle modifications.  Patient has cut out sugary drinks inclusive  of Starbucks double shot and soda.      Relevant Orders   HgB A1c (Completed)    Return in about 6 months (around 02/08/2024) for CPE and Labs.    Margarie Shay, NP

## 2023-08-09 NOTE — Assessment & Plan Note (Signed)
 Checking her glucose infrequently.  Patient's A1c is 6.4% which is elevation since last time.  Mentions that her mother-in-law was making lots of desserts and that she has had [place] ice cream recently.  Will continue working on healthy lifestyle modifications.  Patient has cut out sugary drinks inclusive of [employer] double shot and soda.

## 2023-08-09 NOTE — Patient Instructions (Signed)
 Nice to see you today Your A1C is 6.4% I want to see you in 6 months for your physical and labs

## 2023-08-16 ENCOUNTER — Ambulatory Visit: Admitting: Nurse Practitioner

## 2023-08-23 ENCOUNTER — Other Ambulatory Visit: Payer: Self-pay | Admitting: Nurse Practitioner

## 2023-08-23 DIAGNOSIS — G8929 Other chronic pain: Secondary | ICD-10-CM

## 2023-08-23 DIAGNOSIS — M5416 Radiculopathy, lumbar region: Secondary | ICD-10-CM

## 2023-09-20 DIAGNOSIS — R200 Anesthesia of skin: Secondary | ICD-10-CM | POA: Diagnosis not present

## 2023-09-21 ENCOUNTER — Telehealth: Payer: Self-pay

## 2023-09-21 NOTE — Telephone Encounter (Signed)
 Referral faxed to [facility] x 2

## 2023-09-23 ENCOUNTER — Other Ambulatory Visit: Payer: Self-pay | Admitting: Family

## 2023-09-23 DIAGNOSIS — I10 Essential (primary) hypertension: Secondary | ICD-10-CM

## 2023-09-29 ENCOUNTER — Other Ambulatory Visit: Payer: Self-pay | Admitting: Nurse Practitioner

## 2023-09-29 DIAGNOSIS — B001 Herpesviral vesicular dermatitis: Secondary | ICD-10-CM

## 2023-11-01 DIAGNOSIS — K581 Irritable bowel syndrome with constipation: Secondary | ICD-10-CM | POA: Diagnosis not present

## 2023-12-22 ENCOUNTER — Other Ambulatory Visit: Payer: Self-pay | Admitting: Family

## 2023-12-22 DIAGNOSIS — I10 Essential (primary) hypertension: Secondary | ICD-10-CM

## 2024-01-17 ENCOUNTER — Other Ambulatory Visit: Payer: Self-pay | Admitting: Nurse Practitioner

## 2024-01-17 DIAGNOSIS — G8929 Other chronic pain: Secondary | ICD-10-CM

## 2024-01-17 DIAGNOSIS — M5416 Radiculopathy, lumbar region: Secondary | ICD-10-CM

## 2024-01-19 DIAGNOSIS — Z124 Encounter for screening for malignant neoplasm of cervix: Secondary | ICD-10-CM | POA: Diagnosis not present

## 2024-01-19 DIAGNOSIS — R87615 Unsatisfactory cytologic smear of cervix: Secondary | ICD-10-CM | POA: Diagnosis not present

## 2024-01-29 ENCOUNTER — Other Ambulatory Visit: Payer: Self-pay | Admitting: Nurse Practitioner

## 2024-02-07 DIAGNOSIS — M542 Cervicalgia: Secondary | ICD-10-CM | POA: Diagnosis not present

## 2024-02-07 DIAGNOSIS — R413 Other amnesia: Secondary | ICD-10-CM | POA: Diagnosis not present

## 2024-02-07 DIAGNOSIS — G479 Sleep disorder, unspecified: Secondary | ICD-10-CM | POA: Diagnosis not present

## 2024-02-07 DIAGNOSIS — G43709 Chronic migraine without aura, not intractable, without status migrainosus: Secondary | ICD-10-CM | POA: Diagnosis not present

## 2024-02-09 ENCOUNTER — Ambulatory Visit (INDEPENDENT_AMBULATORY_CARE_PROVIDER_SITE_OTHER): Admitting: Nurse Practitioner

## 2024-02-09 ENCOUNTER — Encounter: Payer: Self-pay | Admitting: Nurse Practitioner

## 2024-02-09 VITALS — BP 100/80 | HR 100 | Temp 98.3°F | Ht 65.0 in | Wt 164.6 lb

## 2024-02-09 DIAGNOSIS — I10 Essential (primary) hypertension: Secondary | ICD-10-CM

## 2024-02-09 DIAGNOSIS — K219 Gastro-esophageal reflux disease without esophagitis: Secondary | ICD-10-CM

## 2024-02-09 DIAGNOSIS — Z87891 Personal history of nicotine dependence: Secondary | ICD-10-CM | POA: Diagnosis not present

## 2024-02-09 DIAGNOSIS — E118 Type 2 diabetes mellitus with unspecified complications: Secondary | ICD-10-CM | POA: Diagnosis not present

## 2024-02-09 DIAGNOSIS — Z1231 Encounter for screening mammogram for malignant neoplasm of breast: Secondary | ICD-10-CM

## 2024-02-09 DIAGNOSIS — E782 Mixed hyperlipidemia: Secondary | ICD-10-CM

## 2024-02-09 DIAGNOSIS — Z0000 Encounter for general adult medical examination without abnormal findings: Secondary | ICD-10-CM

## 2024-02-09 DIAGNOSIS — B001 Herpesviral vesicular dermatitis: Secondary | ICD-10-CM

## 2024-02-09 DIAGNOSIS — M5416 Radiculopathy, lumbar region: Secondary | ICD-10-CM

## 2024-02-09 DIAGNOSIS — Z126 Encounter for screening for malignant neoplasm of bladder: Secondary | ICD-10-CM

## 2024-02-09 NOTE — Assessment & Plan Note (Signed)
 Discussed age-appropriate immunizations and screening exams.  Did review patient's personal, surgical, social, family histories.  Patient is up-to-date on all age-appropriate vaccinations she would like.  Patient declined Prevnar 20 in office today.  Patient is up-to-date on CRC screening, pelvic exam.  Mammogram was ordered and patient was given information to call and set up at her convenience.  Patient was given information at discharge about preventative healthcare maintenance with anticipatory guidance

## 2024-02-09 NOTE — Patient Instructions (Signed)
 Nice to see you today I will be in touch with the labs once I have them Follow up with me in 6 months, sooner if you need me  Call and schedule your mammogram at   [facility] [address] ( on hospital grounds) [place], [place]  [phone]

## 2024-02-09 NOTE — Assessment & Plan Note (Addendum)
 Currently maintained on lifestyle modifications only.  Pending A1c

## 2024-02-09 NOTE — Assessment & Plan Note (Signed)
 Patient currently maintained on benazepril  10 mg daily.  Blood pressure controlled patient tolerating medication well.  Continue medication as prescribed

## 2024-02-09 NOTE — Assessment & Plan Note (Signed)
 History of the same.  Patient underwent endoscopy which showed no abnormal cells.  Patient is on omeprazole  40 mg twice daily.  She did try to drop down to omeprazole  40 mg daily for 2 weeks had recurrent heartburn but was intolerable.  Will continue omeprazole  40 mg twice daily

## 2024-02-09 NOTE — Assessment & Plan Note (Signed)
History of same pending lipid panel today

## 2024-02-09 NOTE — Progress Notes (Signed)
 Established Patient Office Visit  Subjective   Patient ID: Morgan [name], female    DOB: 09/20/1975  Age: 48 y.o. MRN: [id number]  Chief Complaint  Patient presents with   Annual Exam    HPI  HTN: Currently maintained on benazepril  10 mg daily. Can check it at home when she needs   Migraines: Maintained on Ajovy injection monthly, nortriptyline  60 mg nightly, rizatriptan as needed. Dr. Darlyn Farrow does the gabapentin  for neuropathy and headaches   GERD: Currently maintained on omeprazole  40 mg twice daily. Endoscopy done 02/18/2023 that showed a hiatal hernia.  Rest of the exam was normal. States that she has tried to come down from the 40 BID to 40 every day. 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  Immunizations: -Tetanus: Completed in 2022 -Influenza: [facility] -Shingles:too young -Pneumonia: refused   Diet: Fair diet. She is eating one meal a day and does not snack. States that she will do a banana sometimes. She is drinking coffee Exercise: . She is walking a day   Eye exam: Completes annually. Doing readers with progressive lense  Dental exam: needs updating     Colonoscopy: Completed in 02/19/2023, repeat 10 years patient will be due 2034 Lung Cancer Screening: Does not qualify  Pap smear: Hysterectomy, followed by GYN and doing a pelvic exam. Dr. Hoy[facility]  Mammogram: Overdue. Ordered today   DEXA: too young   Sleep: going to bed around 8-930 and will get up around 5. Feels rested sometimes. If she does not snooze. Does snore       Review of Systems  Constitutional:  Negative for chills and fever.  Respiratory:  Negative for shortness of breath.   Cardiovascular:  Negative for chest pain and leg swelling.  Gastrointestinal:  Negative for abdominal pain, blood in stool, constipation, diarrhea, nausea and  vomiting.       BM every other day   Genitourinary:  Negative for dysuria and hematuria.  Neurological:  Positive for tingling and headaches. Negative for dizziness.  Psychiatric/Behavioral:  Negative for hallucinations and suicidal ideas.       Objective:     BP 100/80   Pulse 100   Temp 98.3 F (36.8 C) (Oral)   Ht 5' 5 (1.651 m)   Wt 164 lb 9.6 oz (74.7 kg)   SpO2 97%   BMI 27.39 kg/m  BP Readings from Last 3 Encounters:  02/09/24 100/80  08/09/23 112/82  07/05/23 100/70   Wt Readings from Last 3 Encounters:  02/09/24 164 lb 9.6 oz (74.7 kg)  08/09/23 159 lb (72.1 kg)  07/05/23 154 lb (69.9 kg)   SpO2 Readings from Last 3 Encounters:  02/09/24 97%  08/09/23 97%  07/05/23 98%      Physical Exam Vitals and nursing note reviewed.  Constitutional:      Appearance: Normal appearance.  HENT:     Right Ear: Tympanic membrane, ear canal and external ear normal.     Left Ear: Tympanic membrane, ear canal and external ear normal.     Mouth/Throat:     Mouth: Mucous membranes are moist.     Pharynx: Oropharynx is clear.  Eyes:     Extraocular Movements: Extraocular movements intact.     Pupils: Pupils are equal, round, and reactive to light.  Cardiovascular:     Rate  and Rhythm: Normal rate and regular rhythm.     Pulses: Normal pulses.     Heart sounds: Normal heart sounds.  Pulmonary:     Effort: Pulmonary effort is normal.     Breath sounds: Normal breath sounds.  Abdominal:     General: Bowel sounds are normal. There is no distension.     Palpations: There is no mass.     Tenderness: There is abdominal tenderness in the epigastric area.     Hernia: No hernia is present.  Musculoskeletal:     Right lower leg: No edema.     Left lower leg: No edema.  Lymphadenopathy:     Cervical: No cervical adenopathy.  Skin:    General: Skin is warm.  Neurological:     General: No focal deficit present.     Mental Status: She is alert.     Deep Tendon Reflexes:      Reflex Scores:      Bicep reflexes are 2+ on the right side and 2+ on the left side.      Patellar reflexes are 1+ on the right side and 2+ on the left side.    Comments: Bilateral upper and lower extremity strength 5/5  Psychiatric:        Mood and Affect: Mood normal.        Behavior: Behavior normal.        Thought Content: Thought content normal.        Judgment: Judgment normal.      No results found for any visits on 02/09/24.    The 10-year ASCVD risk score (Arnett DK, et al., 2019) is: 1.1%    Assessment & Plan:   Problem List Items Addressed This Visit       Cardiovascular and Mediastinum   HTN (hypertension)   Patient currently maintained on benazepril  10 mg daily.  Blood pressure controlled patient tolerating medication well.  Continue medication as prescribed      Relevant Orders   Comprehensive metabolic panel with GFR   CBC with Differential/Platelet     Digestive   GERD (gastroesophageal reflux disease)   History of the same.  Patient underwent endoscopy which showed no abnormal cells.  Patient is on omeprazole  40 mg twice daily.  She did try to drop down to omeprazole  40 mg daily for 2 weeks had recurrent heartburn but was intolerable.  Will continue omeprazole  40 mg twice daily      Relevant Medications   hyoscyamine (LEVBID) 0.375 MG 12 hr tablet   Recurrent cold sores   Continue valacyclovir  500 mg as needed        Endocrine   Controlled diabetes mellitus type 2 with complications (HCC) (Chronic)   Currently maintained on lifestyle modifications only.  Pending A1c      Relevant Orders   Comprehensive metabolic panel with GFR   CBC with Differential/Platelet   Hemoglobin A1c   Microalbumin / creatinine urine ratio   Lipid panel     Nervous and Auditory   Lumbar radiculopathy   History of same currently maintained on gabapentin .  Patient is followed by neurology.  Continue taking medication as prescribed follow specialist as recommended         Other   Mixed hyperlipidemia   History of same pending lipid panel today.      Preventative health care - Primary   Discussed age-appropriate immunizations and screening exams.  Did review patient's personal, surgical, social, family histories.  Patient is up-to-date  on all age-appropriate vaccinations she would like.  Patient declined Prevnar 20 in office today.  Patient is up-to-date on CRC screening, pelvic exam.  Mammogram was ordered and patient was given information to call and set up at her convenience.  Patient was given information at discharge about preventative healthcare maintenance with anticipatory guidance      Relevant Orders   Comprehensive metabolic panel with GFR   CBC with Differential/Platelet   TSH   Former smoker   Pending urine microscopy rule out microscopic hematuria      Relevant Orders   Urine Microscopic   Other Visit Diagnoses       Screening mammogram for breast cancer       Relevant Orders   MM 3D SCREENING MAMMOGRAM BILATERAL BREAST     Screening for bladder cancer       Relevant Orders   Urine Microscopic       Return in about 6 months (around 08/09/2024) for DM recheck.    Adina Crandall, NP

## 2024-02-09 NOTE — Assessment & Plan Note (Signed)
 Pending urine microscopy rule out microscopic hematuria

## 2024-02-09 NOTE — Assessment & Plan Note (Signed)
 History of same currently maintained on gabapentin .  Patient is followed by neurology.  Continue taking medication as prescribed follow specialist as recommended

## 2024-02-09 NOTE — Assessment & Plan Note (Signed)
 Continue valacyclovir  500 mg as needed

## 2024-02-10 ENCOUNTER — Ambulatory Visit: Payer: Self-pay | Admitting: Nurse Practitioner

## 2024-02-10 LAB — URINALYSIS, MICROSCOPIC ONLY

## 2024-02-10 LAB — CBC WITH DIFFERENTIAL/PLATELET
Basophils Absolute: 0.1 K/uL (ref 0.0–0.1)
Basophils Relative: 1.2 % (ref 0.0–3.0)
Eosinophils Absolute: 0.2 K/uL (ref 0.0–0.7)
Eosinophils Relative: 2.8 % (ref 0.0–5.0)
HCT: 39.5 % (ref 36.0–46.0)
Hemoglobin: 13 g/dL (ref 12.0–15.0)
Lymphocytes Relative: 28.4 % (ref 12.0–46.0)
Lymphs Abs: 2.2 K/uL (ref 0.7–4.0)
MCHC: 32.8 g/dL (ref 30.0–36.0)
MCV: 83.7 fl (ref 78.0–100.0)
Monocytes Absolute: 0.5 K/uL (ref 0.1–1.0)
Monocytes Relative: 7 % (ref 3.0–12.0)
Neutro Abs: 4.6 K/uL (ref 1.4–7.7)
Neutrophils Relative %: 60.6 % (ref 43.0–77.0)
Platelets: 279 K/uL (ref 150.0–400.0)
RBC: 4.72 Mil/uL (ref 3.87–5.11)
RDW: 15.1 % (ref 11.5–15.5)
WBC: 7.6 K/uL (ref 4.0–10.5)

## 2024-02-10 LAB — COMPREHENSIVE METABOLIC PANEL WITH GFR
ALT: 20 U/L (ref 0–35)
AST: 23 U/L (ref 0–37)
Albumin: 4.7 g/dL (ref 3.5–5.2)
Alkaline Phosphatase: 106 U/L (ref 39–117)
BUN: 13 mg/dL (ref 6–23)
CO2: 31 meq/L (ref 19–32)
Calcium: 9.3 mg/dL (ref 8.4–10.5)
Chloride: 101 meq/L (ref 96–112)
Creatinine, Ser: 0.86 mg/dL (ref 0.40–1.20)
GFR: 80.95 mL/min (ref >60.00)
Glucose, Bld: 95 mg/dL (ref 70–99)
Potassium: 3.9 meq/L (ref 3.5–5.1)
Sodium: 143 meq/L (ref 135–145)
Total Bilirubin: 0.3 mg/dL (ref 0.2–1.2)
Total Protein: 7.3 g/dL (ref 6.0–8.3)

## 2024-02-10 LAB — LIPID PANEL
Cholesterol: 219 mg/dL — ABNORMAL HIGH (ref 0–200)
HDL: 83.4 mg/dL (ref >39.00)
LDL Cholesterol: 118 mg/dL — ABNORMAL HIGH (ref 0–99)
NonHDL: 136.04
Total CHOL/HDL Ratio: 3
Triglycerides: 92 mg/dL (ref 0.0–149.0)
VLDL: 18.4 mg/dL (ref 0.0–40.0)

## 2024-02-10 LAB — HEMOGLOBIN A1C: Hgb A1c MFr Bld: 6.5 % (ref 4.6–6.5)

## 2024-02-10 LAB — MICROALBUMIN / CREATININE URINE RATIO
Creatinine,U: 22.8 mg/dL
Microalb Creat Ratio: UNDETERMINED mg/g (ref 0.0–30.0)
Microalb, Ur: <0.7 mg/dL

## 2024-02-10 LAB — TSH: TSH: 1.57 u[IU]/mL (ref 0.35–5.50)

## 2024-02-24 ENCOUNTER — Other Ambulatory Visit: Payer: Self-pay | Admitting: Nurse Practitioner

## 2024-02-24 DIAGNOSIS — E782 Mixed hyperlipidemia: Secondary | ICD-10-CM

## 2024-02-24 NOTE — Progress Notes (Signed)
 Morgan [name]                                          MRN: [id number]   02/24/2024   The [facility] Quality Team Specialist reviewed this patient medical record for the purposes of chart review for care gap closure. The following were reviewed: abstraction for care gap closure-kidney health evaluation for diabetes:eGFR  and uACR.    [facility] Quality Team

## 2024-03-12 ENCOUNTER — Encounter: Payer: Self-pay | Admitting: Nurse Practitioner

## 2024-03-24 ENCOUNTER — Ambulatory Visit
Admission: RE | Admit: 2024-03-24 | Discharge: 2024-03-24 | Disposition: A | Source: Ambulatory Visit | Attending: Nurse Practitioner

## 2024-03-24 DIAGNOSIS — Z1231 Encounter for screening mammogram for malignant neoplasm of breast: Secondary | ICD-10-CM | POA: Diagnosis not present

## 2024-03-31 ENCOUNTER — Telehealth: Payer: Self-pay

## 2024-03-31 MED ORDER — ONDANSETRON 4 MG PO TBDP: 4.0000 mg | ORAL_TABLET | Freq: Three times a day (TID) | ORAL | 0 refills | Status: DC | PRN

## 2024-03-31 NOTE — Telephone Encounter (Addendum)
 I spoke with pt; pt said she ran out of anti nausea med on 03/24/24.pt said it does not matter what she eats she gets nauseated, no vomiting, no diarrhea and no fever. No abd pain. Pt does not know why she gets nauseated. Offered pt appt but pt declined appt because does not want to pay for office visit since ins does not pay well.pt last seen 02/09/24 for annual exam. Pt is able to keep water  down and keeps foods down but eating smaller amts. Per pt hx S/P Hysterectomy. UC & ED precautions given and pt voiced understanding. Pt wants the zofran  disintegrating tab 4 mg sent to [facility]. Pt request cb after Adina reviews note. Sending to CHRISTELLA Crandall NP and [employer]

## 2024-03-31 NOTE — Telephone Encounter (Signed)
 Pt notified as instructed and pt said she was out eating and pt will call 04/03/24 to schedule appt to be seen.Ulcerative Colitis & ED precautions reviewed again and pt voiced understanding.

## 2024-03-31 NOTE — Telephone Encounter (Signed)
 Last I see was when she had surgery. Can we find out what is going on with the patient please

## 2024-03-31 NOTE — Telephone Encounter (Signed)
 Unable to reach patient by phone and left v/m requesting call back at [phone]. Need to get additional info why need zofran  and pt needs to be triaged. E2[facility] can triage pt. Sending note to [facility] triage.

## 2024-03-31 NOTE — Telephone Encounter (Signed)
 Does not look like zofran  is on med list. Will this require an office visit?

## 2024-03-31 NOTE — Addendum Note (Signed)
 Addended by: WENDEE LYNWOOD HERO on: 03/31/2024 03:09 PM   Modules accepted: Orders

## 2024-03-31 NOTE — Telephone Encounter (Signed)
 I will send in a short course of medication to help with nausea but she needs to be evaluated

## 2024-03-31 NOTE — Telephone Encounter (Signed)
 Unable to reach patient or pts husband (DPR signed) by phone and waiting to hear back from pt.

## 2024-03-31 NOTE — Telephone Encounter (Signed)
 Copied from CRM #[id number]. Topic: Clinical - Medication Question >> Mar 31, 2024  8:10 AM Thersia BROCKS wrote: Reason for CRM: Patient called in wanting to speak with nurse regarding wanting to get her Zofran  refilled, would like a callback

## 2024-04-03 ENCOUNTER — Other Ambulatory Visit: Payer: Self-pay | Admitting: Nurse Practitioner

## 2024-04-03 DIAGNOSIS — E782 Mixed hyperlipidemia: Secondary | ICD-10-CM

## 2024-04-28 ENCOUNTER — Other Ambulatory Visit: Payer: Self-pay | Admitting: Nurse Practitioner

## 2024-05-03 ENCOUNTER — Other Ambulatory Visit: Payer: Self-pay | Admitting: Nurse Practitioner

## 2024-05-03 DIAGNOSIS — B001 Herpesviral vesicular dermatitis: Secondary | ICD-10-CM

## 2024-05-17 ENCOUNTER — Encounter: Admission: RE | Payer: Self-pay | Source: Home / Self Care

## 2024-05-17 ENCOUNTER — Ambulatory Visit: Admission: RE | Admit: 2024-05-17 | Admitting: Gastroenterology
